# Patient Record
Sex: Male | Born: 1940 | Race: Asian | Hispanic: No | Marital: Married | Smoking: Former smoker
Health system: Southern US, Community
[De-identification: ages and names within clinical notes are randomized; demographics above are authoritative.]

## PROBLEM LIST (undated history)

## (undated) DIAGNOSIS — I10 Essential (primary) hypertension: Secondary | ICD-10-CM

## (undated) DIAGNOSIS — I639 Cerebral infarction, unspecified: Secondary | ICD-10-CM

## (undated) HISTORY — PX: APPENDECTOMY: SHX54

## (undated) HISTORY — DX: Essential (primary) hypertension: I10

## (undated) HISTORY — DX: Cerebral infarction, unspecified: I63.9

---

## 1995-11-26 HISTORY — PX: OTHER SURGICAL HISTORY: SHX169

## 2006-08-26 ENCOUNTER — Ambulatory Visit: Payer: Self-pay | Admitting: Family Medicine

## 2007-05-06 DIAGNOSIS — Z8679 Personal history of other diseases of the circulatory system: Secondary | ICD-10-CM | POA: Insufficient documentation

## 2007-05-06 DIAGNOSIS — I10 Essential (primary) hypertension: Secondary | ICD-10-CM | POA: Insufficient documentation

## 2007-08-13 ENCOUNTER — Encounter: Payer: Self-pay | Admitting: Family Medicine

## 2007-08-18 ENCOUNTER — Telehealth (INDEPENDENT_AMBULATORY_CARE_PROVIDER_SITE_OTHER): Payer: Self-pay | Admitting: *Deleted

## 2009-11-03 ENCOUNTER — Emergency Department (HOSPITAL_BASED_OUTPATIENT_CLINIC_OR_DEPARTMENT_OTHER): Admission: EM | Admit: 2009-11-03 | Discharge: 2009-11-03 | Payer: Self-pay | Admitting: Emergency Medicine

## 2009-11-03 ENCOUNTER — Ambulatory Visit: Payer: Self-pay | Admitting: Radiology

## 2011-02-26 LAB — CBC
Hemoglobin: 14.9 g/dL (ref 13.0–17.0)
RBC: 4.71 MIL/uL (ref 4.22–5.81)
RDW: 11.9 % (ref 11.5–15.5)

## 2011-02-26 LAB — DIFFERENTIAL
Basophils Absolute: 0.2 10*3/uL — ABNORMAL HIGH (ref 0.0–0.1)
Lymphocytes Relative: 7 % — ABNORMAL LOW (ref 12–46)
Monocytes Absolute: 1.7 10*3/uL — ABNORMAL HIGH (ref 0.1–1.0)
Monocytes Relative: 11 % (ref 3–12)
Neutro Abs: 12.7 10*3/uL — ABNORMAL HIGH (ref 1.7–7.7)
Neutrophils Relative %: 81 % — ABNORMAL HIGH (ref 43–77)

## 2011-02-26 LAB — BASIC METABOLIC PANEL
GFR calc non Af Amer: 50 mL/min — ABNORMAL LOW (ref >60)
Potassium: 2.8 mEq/L — ABNORMAL LOW (ref 3.5–5.1)
Sodium: 141 mEq/L (ref 135–145)

## 2012-08-21 DIAGNOSIS — R609 Edema, unspecified: Secondary | ICD-10-CM | POA: Diagnosis not present

## 2012-08-21 DIAGNOSIS — I6789 Other cerebrovascular disease: Secondary | ICD-10-CM | POA: Diagnosis not present

## 2012-08-21 DIAGNOSIS — H539 Unspecified visual disturbance: Secondary | ICD-10-CM | POA: Diagnosis not present

## 2012-08-21 DIAGNOSIS — R21 Rash and other nonspecific skin eruption: Secondary | ICD-10-CM | POA: Diagnosis not present

## 2012-08-21 DIAGNOSIS — E1159 Type 2 diabetes mellitus with other circulatory complications: Secondary | ICD-10-CM | POA: Diagnosis not present

## 2012-08-21 DIAGNOSIS — I69959 Hemiplegia and hemiparesis following unspecified cerebrovascular disease affecting unspecified side: Secondary | ICD-10-CM | POA: Diagnosis not present

## 2012-08-21 DIAGNOSIS — I10 Essential (primary) hypertension: Secondary | ICD-10-CM | POA: Diagnosis not present

## 2012-08-21 DIAGNOSIS — Z23 Encounter for immunization: Secondary | ICD-10-CM | POA: Diagnosis not present

## 2012-08-21 DIAGNOSIS — Z125 Encounter for screening for malignant neoplasm of prostate: Secondary | ICD-10-CM | POA: Diagnosis not present

## 2012-08-21 DIAGNOSIS — E119 Type 2 diabetes mellitus without complications: Secondary | ICD-10-CM | POA: Diagnosis not present

## 2012-08-31 DIAGNOSIS — I6789 Other cerebrovascular disease: Secondary | ICD-10-CM | POA: Diagnosis not present

## 2012-08-31 DIAGNOSIS — I739 Peripheral vascular disease, unspecified: Secondary | ICD-10-CM | POA: Diagnosis not present

## 2012-08-31 DIAGNOSIS — I10 Essential (primary) hypertension: Secondary | ICD-10-CM | POA: Diagnosis not present

## 2012-08-31 DIAGNOSIS — I119 Hypertensive heart disease without heart failure: Secondary | ICD-10-CM | POA: Diagnosis not present

## 2012-09-09 DIAGNOSIS — I69959 Hemiplegia and hemiparesis following unspecified cerebrovascular disease affecting unspecified side: Secondary | ICD-10-CM | POA: Diagnosis not present

## 2012-09-09 DIAGNOSIS — I10 Essential (primary) hypertension: Secondary | ICD-10-CM | POA: Diagnosis not present

## 2012-09-09 DIAGNOSIS — R21 Rash and other nonspecific skin eruption: Secondary | ICD-10-CM | POA: Diagnosis not present

## 2012-09-09 DIAGNOSIS — I6789 Other cerebrovascular disease: Secondary | ICD-10-CM | POA: Diagnosis not present

## 2012-10-26 DIAGNOSIS — Z79899 Other long term (current) drug therapy: Secondary | ICD-10-CM | POA: Diagnosis not present

## 2012-10-26 DIAGNOSIS — L259 Unspecified contact dermatitis, unspecified cause: Secondary | ICD-10-CM | POA: Diagnosis not present

## 2012-10-26 DIAGNOSIS — I10 Essential (primary) hypertension: Secondary | ICD-10-CM | POA: Diagnosis not present

## 2012-11-09 DIAGNOSIS — L259 Unspecified contact dermatitis, unspecified cause: Secondary | ICD-10-CM | POA: Diagnosis not present

## 2012-11-09 DIAGNOSIS — E876 Hypokalemia: Secondary | ICD-10-CM | POA: Diagnosis not present

## 2012-11-09 DIAGNOSIS — I10 Essential (primary) hypertension: Secondary | ICD-10-CM | POA: Diagnosis not present

## 2012-12-07 DIAGNOSIS — R072 Precordial pain: Secondary | ICD-10-CM | POA: Diagnosis not present

## 2012-12-07 DIAGNOSIS — Z0000 Encounter for general adult medical examination without abnormal findings: Secondary | ICD-10-CM | POA: Diagnosis not present

## 2012-12-07 DIAGNOSIS — I10 Essential (primary) hypertension: Secondary | ICD-10-CM | POA: Diagnosis not present

## 2012-12-07 DIAGNOSIS — E876 Hypokalemia: Secondary | ICD-10-CM | POA: Diagnosis not present

## 2013-01-05 DIAGNOSIS — E782 Mixed hyperlipidemia: Secondary | ICD-10-CM | POA: Diagnosis not present

## 2013-01-05 DIAGNOSIS — L259 Unspecified contact dermatitis, unspecified cause: Secondary | ICD-10-CM | POA: Diagnosis not present

## 2013-01-05 DIAGNOSIS — R5381 Other malaise: Secondary | ICD-10-CM | POA: Diagnosis not present

## 2013-01-05 DIAGNOSIS — I10 Essential (primary) hypertension: Secondary | ICD-10-CM | POA: Diagnosis not present

## 2013-01-05 DIAGNOSIS — R5383 Other fatigue: Secondary | ICD-10-CM | POA: Diagnosis not present

## 2013-02-19 DIAGNOSIS — I10 Essential (primary) hypertension: Secondary | ICD-10-CM | POA: Diagnosis not present

## 2013-02-19 DIAGNOSIS — I6990 Unspecified sequelae of unspecified cerebrovascular disease: Secondary | ICD-10-CM | POA: Diagnosis not present

## 2013-02-19 DIAGNOSIS — L299 Pruritus, unspecified: Secondary | ICD-10-CM | POA: Diagnosis not present

## 2013-04-13 DIAGNOSIS — L2089 Other atopic dermatitis: Secondary | ICD-10-CM | POA: Diagnosis not present

## 2013-04-13 DIAGNOSIS — E876 Hypokalemia: Secondary | ICD-10-CM | POA: Diagnosis not present

## 2013-06-10 DIAGNOSIS — L299 Pruritus, unspecified: Secondary | ICD-10-CM | POA: Diagnosis not present

## 2013-06-10 DIAGNOSIS — H1045 Other chronic allergic conjunctivitis: Secondary | ICD-10-CM | POA: Diagnosis not present

## 2013-06-10 DIAGNOSIS — J3089 Other allergic rhinitis: Secondary | ICD-10-CM | POA: Diagnosis not present

## 2013-07-23 DIAGNOSIS — I10 Essential (primary) hypertension: Secondary | ICD-10-CM | POA: Diagnosis not present

## 2013-07-23 DIAGNOSIS — Z125 Encounter for screening for malignant neoplasm of prostate: Secondary | ICD-10-CM | POA: Diagnosis not present

## 2013-07-23 DIAGNOSIS — Z23 Encounter for immunization: Secondary | ICD-10-CM | POA: Diagnosis not present

## 2013-07-23 DIAGNOSIS — Z79899 Other long term (current) drug therapy: Secondary | ICD-10-CM | POA: Diagnosis not present

## 2013-07-23 DIAGNOSIS — L2089 Other atopic dermatitis: Secondary | ICD-10-CM | POA: Diagnosis not present

## 2013-07-23 DIAGNOSIS — Z0000 Encounter for general adult medical examination without abnormal findings: Secondary | ICD-10-CM | POA: Diagnosis not present

## 2013-07-23 DIAGNOSIS — J309 Allergic rhinitis, unspecified: Secondary | ICD-10-CM | POA: Diagnosis not present

## 2013-07-23 DIAGNOSIS — N529 Male erectile dysfunction, unspecified: Secondary | ICD-10-CM | POA: Diagnosis not present

## 2013-07-23 DIAGNOSIS — I69959 Hemiplegia and hemiparesis following unspecified cerebrovascular disease affecting unspecified side: Secondary | ICD-10-CM | POA: Diagnosis not present

## 2013-07-23 DIAGNOSIS — I6990 Unspecified sequelae of unspecified cerebrovascular disease: Secondary | ICD-10-CM | POA: Diagnosis not present

## 2013-12-01 DIAGNOSIS — H5210 Myopia, unspecified eye: Secondary | ICD-10-CM | POA: Diagnosis not present

## 2013-12-01 DIAGNOSIS — H35039 Hypertensive retinopathy, unspecified eye: Secondary | ICD-10-CM | POA: Diagnosis not present

## 2014-03-17 DIAGNOSIS — Z79899 Other long term (current) drug therapy: Secondary | ICD-10-CM | POA: Diagnosis not present

## 2014-03-17 DIAGNOSIS — I6990 Unspecified sequelae of unspecified cerebrovascular disease: Secondary | ICD-10-CM | POA: Diagnosis not present

## 2014-03-17 DIAGNOSIS — I69959 Hemiplegia and hemiparesis following unspecified cerebrovascular disease affecting unspecified side: Secondary | ICD-10-CM | POA: Diagnosis not present

## 2014-03-17 DIAGNOSIS — I10 Essential (primary) hypertension: Secondary | ICD-10-CM | POA: Diagnosis not present

## 2014-06-07 DIAGNOSIS — H409 Unspecified glaucoma: Secondary | ICD-10-CM | POA: Diagnosis not present

## 2014-06-07 DIAGNOSIS — H2510 Age-related nuclear cataract, unspecified eye: Secondary | ICD-10-CM | POA: Diagnosis not present

## 2014-06-07 DIAGNOSIS — H02839 Dermatochalasis of unspecified eye, unspecified eyelid: Secondary | ICD-10-CM | POA: Diagnosis not present

## 2014-06-07 DIAGNOSIS — H18419 Arcus senilis, unspecified eye: Secondary | ICD-10-CM | POA: Diagnosis not present

## 2014-06-07 DIAGNOSIS — H402290 Chronic angle-closure glaucoma, unspecified eye, stage unspecified: Secondary | ICD-10-CM | POA: Diagnosis not present

## 2014-06-21 DIAGNOSIS — H409 Unspecified glaucoma: Secondary | ICD-10-CM | POA: Diagnosis not present

## 2014-06-21 DIAGNOSIS — H26499 Other secondary cataract, unspecified eye: Secondary | ICD-10-CM | POA: Diagnosis not present

## 2014-06-21 DIAGNOSIS — H40219 Acute angle-closure glaucoma, unspecified eye: Secondary | ICD-10-CM | POA: Diagnosis not present

## 2014-07-18 DIAGNOSIS — H269 Unspecified cataract: Secondary | ICD-10-CM | POA: Diagnosis not present

## 2014-07-18 DIAGNOSIS — H2510 Age-related nuclear cataract, unspecified eye: Secondary | ICD-10-CM | POA: Diagnosis not present

## 2014-07-18 HISTORY — PX: EYE SURGERY: SHX253

## 2014-07-19 DIAGNOSIS — H2510 Age-related nuclear cataract, unspecified eye: Secondary | ICD-10-CM | POA: Diagnosis not present

## 2014-07-29 DIAGNOSIS — L2089 Other atopic dermatitis: Secondary | ICD-10-CM | POA: Diagnosis not present

## 2014-07-29 DIAGNOSIS — I10 Essential (primary) hypertension: Secondary | ICD-10-CM | POA: Diagnosis not present

## 2014-07-29 DIAGNOSIS — J309 Allergic rhinitis, unspecified: Secondary | ICD-10-CM | POA: Diagnosis not present

## 2014-07-29 DIAGNOSIS — R079 Chest pain, unspecified: Secondary | ICD-10-CM | POA: Diagnosis not present

## 2014-07-29 DIAGNOSIS — I69959 Hemiplegia and hemiparesis following unspecified cerebrovascular disease affecting unspecified side: Secondary | ICD-10-CM | POA: Diagnosis not present

## 2014-07-29 DIAGNOSIS — I6990 Unspecified sequelae of unspecified cerebrovascular disease: Secondary | ICD-10-CM | POA: Diagnosis not present

## 2014-07-29 DIAGNOSIS — Z23 Encounter for immunization: Secondary | ICD-10-CM | POA: Diagnosis not present

## 2014-07-29 DIAGNOSIS — Z0000 Encounter for general adult medical examination without abnormal findings: Secondary | ICD-10-CM | POA: Diagnosis not present

## 2014-08-04 DIAGNOSIS — I6990 Unspecified sequelae of unspecified cerebrovascular disease: Secondary | ICD-10-CM | POA: Diagnosis not present

## 2014-08-04 DIAGNOSIS — I10 Essential (primary) hypertension: Secondary | ICD-10-CM | POA: Diagnosis not present

## 2014-08-04 DIAGNOSIS — Z79899 Other long term (current) drug therapy: Secondary | ICD-10-CM | POA: Diagnosis not present

## 2014-08-08 DIAGNOSIS — H2510 Age-related nuclear cataract, unspecified eye: Secondary | ICD-10-CM | POA: Diagnosis not present

## 2014-08-08 DIAGNOSIS — H269 Unspecified cataract: Secondary | ICD-10-CM | POA: Diagnosis not present

## 2014-09-12 ENCOUNTER — Encounter: Payer: Self-pay | Admitting: *Deleted

## 2014-09-14 ENCOUNTER — Ambulatory Visit (INDEPENDENT_AMBULATORY_CARE_PROVIDER_SITE_OTHER): Payer: Medicare Other | Admitting: Cardiology

## 2014-09-14 ENCOUNTER — Encounter: Payer: Self-pay | Admitting: Cardiology

## 2014-09-14 VITALS — BP 132/68 | HR 65 | Ht 64.0 in | Wt 144.0 lb

## 2014-09-14 DIAGNOSIS — R079 Chest pain, unspecified: Secondary | ICD-10-CM | POA: Diagnosis not present

## 2014-09-14 DIAGNOSIS — I10 Essential (primary) hypertension: Secondary | ICD-10-CM

## 2014-09-14 MED ORDER — OMEPRAZOLE 20 MG PO CPDR: 20.0000 mg | DELAYED_RELEASE_CAPSULE | Freq: Every day | ORAL | Status: AC

## 2014-09-14 NOTE — Patient Instructions (Addendum)
Your physician has recommended you make the following change in your medication:    START TAKING OMEPRAZOLE 20 MG DAILY   Your physician recommends that you return for lab work in: [place], ([place])- PLEASE BE FASTING FOR THIS LAB   Your physician has requested that you have a lexiscan myoview. For further information please visit [email or web address]. Please follow instruction sheet, as given.   Your physician recommends that you schedule a follow-up appointment in: [place]

## 2014-09-14 NOTE — Progress Notes (Signed)
Patient ID: Danny Lara, male   DOB: 01-16-1941, 73 y.o.   MRN: [id number]    Patient Name: Danny Lara Date of Encounter: 09/14/2014  Primary Care Provider:  No primary provider on file. Primary Cardiologist: Dorothy Spark  Problem List   Past Medical History  Diagnosis Date  . Hypertension, essential   . Stroke    Past Surgical History  Procedure Laterality Date  . Appendectomy    . Boil removal Right 1997     behind ear  . 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 The patient describes that his pain occurs about twice a month mostly when he lays down in bed and it is relived by sitting up. He sometimes gets it when standing for a prolonged time or bending dow. He is not very active as he has hemiparesis and walks with a cain, but denies chest pain on exertion. He admits to occasional LE edema, currently not present. No claudications, no palpitations, orthopnea or PND. No FH of premature CAD.   Home Medications  Prior to Admission medications   Medication Sig Start Date End Date Taking? Authorizing Provider  amLODipine (NORVASC) 10 MG tablet Take 10 mg by mouth daily.   Yes Historical Provider, MD  aspirin 81 MG tablet Take 81 mg by mouth daily.   Yes Historical Provider, MD  b complex vitamins capsule Take 1 capsule by mouth daily.   Yes Historical Provider, MD  clobetasol ointment (TEMOVATE) 2.99 % Apply 1 application topically 2 (two) times daily as needed.   Yes Historical Provider, MD  potassium chloride SA (K-DUR,KLOR-CON) 20 MEQ tablet Take 10 mEq by mouth once.   Yes Historical Provider, MD  spironolactone (ALDACTONE) 25 MG tablet Take 25 mg by mouth daily.   Yes Historical Provider, MD   Family History  Family History  Problem Relation Age of Onset  . Colon cancer Brother   . Diabetes Brother    . CVA Daughter 8  . Aneurysm Daughter 62  . Tuberculosis Son   . Hypertension Daughter    Social History  History   Social History  . Marital Status: Married    Spouse Name: N/A    Number of Children: N/A  . Years of Education: N/A   Occupational History  . Not on file.   Social History Main Topics  . Smoking status: Former Research scientist (life sciences)  . Smokeless tobacco: Not on file  . Alcohol Use: No  . Drug Use: No  . Sexual Activity: Not on file   Other Topics Concern  . Not on file   Social History Narrative  . No narrative on file    Review of Systems, as per HPI, otherwise negative General:  No chills, fever, night sweats or weight changes.  Cardiovascular:  No chest pain, dyspnea on exertion, edema, orthopnea, palpitations, paroxysmal nocturnal dyspnea. Dermatological: No rash, lesions/masses Respiratory: No cough, dyspnea Urologic: No hematuria, dysuria Abdominal:   No nausea, vomiting, diarrhea, bright red blood per rectum, melena, or hematemesis Neurologic:  No visual changes, wkns, changes in mental status. All other systems reviewed and are otherwise negative except as noted above.  Physical Exam  Blood pressure 132/68, pulse 65, height '5\' 4"'  (1.626 m), weight 144 lb (65.318 kg), SpO2 96.00%.  General: Pleasant, NAD, left hemiparesis Psych: Normal affect.  Neuro: Alert and oriented X 3. Moves all extremities spontaneously. HEENT: Normal  Neck: Supple without bruits or JVD. Lungs:  Resp regular and unlabored, CTA. Heart: RRR no s3, s4, or murmurs. Abdomen: Soft, non-tender, non-distended, BS + x 4.  Extremities: No clubbing, cyanosis, Right calf larger than left, but no swelling, left calf muscle atrophy. DP/PT/Radials 2+ and equal bilaterally.  Labs:  No results found for this basename: CKTOTAL, CKMB, TROPONINI,  in the last 72 hours Lab Results  Component Value Date   WBC 15.7* 11/03/2009   HGB 14.9 11/03/2009   HCT 43.3 11/03/2009   MCV 91.9 11/03/2009    PLT 174 11/03/2009    No results found for this basename: DDIMER   No components found with this basename: POCBNP,     Component Value Date/Time   NA 141 11/03/2009 1937   K 2.8* 11/03/2009 1937   CL 95* 11/03/2009 1937   CO2 32 11/03/2009 1937   GLUCOSE 163* 11/03/2009 1937   BUN 16 11/03/2009 1937   CREATININE 1.4 11/03/2009 1937   CALCIUM 8.9 11/03/2009 1937   GFRNONAA 50* 11/03/2009 1937   GFRAA  Value: >60        The eGFR has been calculated using the MDRD equation. This calculation has not been validated in all clinical situations. eGFR's persistently <60 mL/min signify possible Chronic Kidney Disease. 11/03/2009 1937   No results found for this basename: CHOL, HDL, LDLCALC, TRIG   Accessory Clinical Findings  Echocardiogram - none  ECG - SR, negative T wave in the V1-4 and aVL    Assessment & Plan  73 year old male   1. Chest pain - atypical and most probably secondary to GERD. We will start omeprazole 20 mg po daily. However, his ECG is abnormal and suggest possible anterolateral ischemia  We will therefore order a Lexiscan nuclear stress test to rule out ischemia.  2. Hypertension - well controlled on current regimen  3. H/O CVA - on ASA  4. Lipids - none in our records, we will check  There are no labs, hypokalemia in 2010, we will recheck.   Dorothy Spark, MD, [facility] 09/14/2014, 10:14 AM

## 2014-09-21 ENCOUNTER — Ambulatory Visit (HOSPITAL_COMMUNITY): Payer: Medicare Other | Attending: Internal Medicine | Admitting: Radiology

## 2014-09-21 ENCOUNTER — Other Ambulatory Visit (INDEPENDENT_AMBULATORY_CARE_PROVIDER_SITE_OTHER): Payer: Medicare Other | Admitting: *Deleted

## 2014-09-21 VITALS — BP 131/68 | HR 58 | Ht 64.0 in | Wt 142.0 lb

## 2014-09-21 DIAGNOSIS — R079 Chest pain, unspecified: Secondary | ICD-10-CM

## 2014-09-21 DIAGNOSIS — R9431 Abnormal electrocardiogram [ECG] [EKG]: Secondary | ICD-10-CM | POA: Insufficient documentation

## 2014-09-21 DIAGNOSIS — I998 Other disorder of circulatory system: Secondary | ICD-10-CM | POA: Diagnosis present

## 2014-09-21 DIAGNOSIS — I10 Essential (primary) hypertension: Secondary | ICD-10-CM

## 2014-09-21 DIAGNOSIS — Z8673 Personal history of transient ischemic attack (TIA), and cerebral infarction without residual deficits: Secondary | ICD-10-CM | POA: Insufficient documentation

## 2014-09-21 DIAGNOSIS — R0789 Other chest pain: Secondary | ICD-10-CM | POA: Diagnosis present

## 2014-09-21 LAB — COMPREHENSIVE METABOLIC PANEL
ALT: 27 U/L (ref 0–53)
AST: 30 U/L (ref 0–37)
Albumin: 3.7 g/dL (ref 3.5–5.2)
Alkaline Phosphatase: 56 U/L (ref 39–117)
BUN: 9 mg/dL (ref 6–23)
CO2: 22 mEq/L (ref 19–32)
Calcium: 9 mg/dL (ref 8.4–10.5)
Chloride: 106 mEq/L (ref 96–112)
Creatinine, Ser: 1 mg/dL (ref 0.4–1.5)
GFR: 75.75 mL/min (ref >60.00)
Glucose, Bld: 95 mg/dL (ref 70–99)
Potassium: 4.3 mEq/L (ref 3.5–5.1)
Sodium: 137 mEq/L (ref 135–145)
Total Bilirubin: 1 mg/dL (ref 0.2–1.2)
Total Protein: 7.3 g/dL (ref 6.0–8.3)

## 2014-09-21 LAB — LIPID PANEL
Cholesterol: 152 mg/dL (ref 0–200)
HDL: 39.4 mg/dL (ref >39.00)
LDL Cholesterol: 92 mg/dL (ref 0–99)
NonHDL: 112.6
Total CHOL/HDL Ratio: 4
Triglycerides: 101 mg/dL (ref 0.0–149.0)
VLDL: 20.2 mg/dL (ref 0.0–40.0)

## 2014-09-21 MED ORDER — REGADENOSON 0.4 MG/5ML IV SOLN
0.4000 mg | Freq: Once | INTRAVENOUS | Status: AC
Start: 2014-09-21 — End: 2014-09-21
  Administered 2014-09-21: 0.4 mg via INTRAVENOUS

## 2014-09-21 MED ORDER — TECHNETIUM TC 99M SESTAMIBI GENERIC - CARDIOLITE
33.0000 | Freq: Once | INTRAVENOUS | Status: AC | PRN
  Administered 2014-09-21: 33 via INTRAVENOUS

## 2014-09-21 MED ORDER — TECHNETIUM TC 99M SESTAMIBI GENERIC - CARDIOLITE
11.0000 | Freq: Once | INTRAVENOUS | Status: AC | PRN
  Administered 2014-09-21: 11 via INTRAVENOUS

## 2014-09-21 NOTE — Progress Notes (Signed)
[facility] [address] [place], [place] [address] [phone]    Cardiology Nuclear Med Study  Danny Lara is a 73 y.o. male     MRN : [id number]     DOB: 1941-03-11  Procedure Date: 09/21/2014  Nuclear Med Background Indication for Stress Test:  Evaluation for Ischemia and Abnormal EKG History:  MPI 2014 (normal) in [place] Cardiac Risk Factors: CVA and Hypertension  Symptoms:  Chest Pain (last date of chest discomfort was one month ago) and SOB   Nuclear Pre-Procedure Caffeine/Decaff Intake:  None NPO After: 7:00pm   Lungs:  clear O2 Sat: 98% on room air. IV 0.9% NS with Angio Cath:  22g  IV Site: R Hand  IV Started by:  Crissie Figures, RN  Chest Size (in):  42 Cup Size: n/a  Height: 5\' 4"  (1.626 m)  Weight:  142 lb (64.411 kg)  BMI:  Body mass index is 24.36 kg/(m^2). Tech Comments:  N/A    Nuclear Med Study 1 or 2 day study: 1 day  Stress Test Type:  Lexiscan  Reading MD: N/A  Order Authorizing Provider:  Ena Dawley, MD  Resting Radionuclide: Technetium 42m Sestamibi  Resting Radionuclide Dose: 11.0 mCi   Stress Radionuclide:  Technetium 69m Sestamibi  Stress Radionuclide Dose: 33.0 mCi           Stress Protocol Rest HR: 58 Stress HR: 92  Rest BP: 131/68 Stress BP: 125/59  Exercise Time (min): n/a METS: n/a   Predicted Max HR: 146 bpm % Max HR: 63.01 bpm Rate Pressure Product: 12512   Dose of Adenosine (mg):  n/a Dose of Lexiscan: 0.4 mg  Dose of Atropine (mg): n/a Dose of Dobutamine: n/a mcg/kg/min (at max HR)  Stress Test Technologist: Glade Lloyd, BS-ES  Nuclear Technologist:  Earl Many, CNMT     Rest Procedure:  Myocardial perfusion imaging was performed at rest 45 minutes following the intravenous administration of Technetium 42m Sestamibi. Rest ECG: NSR - Normal EKG  Stress Procedure:  The patient received IV Lexiscan 0.4 mg over 15-seconds.  Technetium 31m Sestamibi injected at 30-seconds.   Quantitative spect images were obtained after a 45 minute delay.  During the infusion of Lexiscan the patient complained of SOB that subsided in recovery.  Stress ECG: No significant change from baseline ECG  QPS Raw Data Images:  Normal; no motion artifact; normal heart/lung ratio. Stress Images:  Normal homogeneous uptake in all areas of the myocardium. Rest Images:  Normal homogeneous uptake in all areas of the myocardium. Subtraction (SDS):  No evidence of ischemia. Transient Ischemic Dilatation (Normal <1.22):  1.04 Lung/Heart Ratio (Normal <0.45):  0.36  Quantitative Gated Spect Images QGS EDV:  90 ml QGS ESV:  41 ml  Impression Exercise Capacity:  Lexiscan with no exercise. BP Response:  Normal blood pressure response. Clinical Symptoms:  No chest pain. ECG Impression:  No significant ST segment change suggestive of ischemia. Comparison with Prior Nuclear Study: No images to compare  Overall Impression:  Normal stress nuclear study.  LV Ejection Fraction: 54%.  LV Wall Motion:  NL LV Function; NL Wall Motion  Darlin Coco MD

## 2014-10-13 ENCOUNTER — Ambulatory Visit: Payer: Self-pay | Admitting: Cardiology

## 2014-11-10 ENCOUNTER — Ambulatory Visit: Payer: Self-pay | Admitting: Cardiology

## 2014-11-30 ENCOUNTER — Ambulatory Visit (INDEPENDENT_AMBULATORY_CARE_PROVIDER_SITE_OTHER): Payer: Medicare Other | Admitting: Cardiology

## 2014-11-30 ENCOUNTER — Encounter: Payer: Self-pay | Admitting: Cardiology

## 2014-11-30 VITALS — BP 124/62 | HR 70 | Ht 64.0 in | Wt 136.4 lb

## 2014-11-30 DIAGNOSIS — R072 Precordial pain: Secondary | ICD-10-CM

## 2014-11-30 DIAGNOSIS — K219 Gastro-esophageal reflux disease without esophagitis: Secondary | ICD-10-CM

## 2014-11-30 DIAGNOSIS — I10 Essential (primary) hypertension: Secondary | ICD-10-CM

## 2014-11-30 NOTE — Patient Instructions (Signed)
Your physician recommends that you continue on your current medications as directed. Please refer to the Current Medication list given to you today.   Your physician wants you to follow-up in: ONE YEAR WITH DR NELSON You will receive a reminder letter in the mail two months in advance. If you don't receive a letter, please call our office to schedule the follow-up appointment.  

## 2014-11-30 NOTE — Progress Notes (Signed)
Patient ID: Danny Lara, male   DOB: [birth year], 75 y.o.   MRN: [id number] Patient ID: Danny Lara, male   DOB: [birth year], 74 y.o.   MRN: [id number]    Patient Name: Danny Lara Date of Encounter: 11/30/2014  Primary Care Provider:  No primary care provider on file. Primary Cardiologist: Dorothy Spark  Problem List   Past Medical History  Diagnosis Date  . Hypertension, essential   . Stroke    Past Surgical History  Procedure Laterality Date  . Appendectomy    . Boil removal Right 1997     behind ear  . 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 The patient describes that his pain occurs about twice a month mostly when he lays down in bed and it is relived by sitting up. He sometimes gets it when standing for a prolonged time or bending dow. He is not very active as he has hemiparesis and walks with a cain, but denies chest pain on exertion. He admits to occasional LE edema, currently not present. No claudications, no palpitations, orthopnea or PND. No FH of premature CAD.   11/30/2013 - the patient is coming after 6 weeks. He stated that his chest pain has resolved. He was started on pantoprazole at the last visit and it was felt that his chest pain was atypical and related to reflux disease. He underwent stress testing and was normal. He has no other complaints.  Home Medications  Prior to Admission medications   Medication Sig Start Date End Date Taking? Authorizing Provider  amLODipine (NORVASC) 10 MG tablet Take 10 mg by mouth daily.   Yes Historical Provider, MD  aspirin 81 MG tablet Take 81 mg by mouth daily.   Yes Historical Provider, MD  b complex vitamins capsule Take 1 capsule by mouth daily.   Yes Historical Provider, MD  clobetasol ointment (TEMOVATE) 0.53 % Apply 1 application topically 2 (two)  times daily as needed.   Yes Historical Provider, MD  potassium chloride SA (K-DUR,KLOR-CON) 20 MEQ tablet Take 10 mEq by mouth once.   Yes Historical Provider, MD  spironolactone (ALDACTONE) 25 MG tablet Take 25 mg by mouth daily.   Yes Historical Provider, MD   Family History  Family History  Problem Relation Age of Onset  . Colon cancer Brother   . Diabetes Brother   . CVA Daughter 94  . Aneurysm Daughter 77  . Tuberculosis Son   . Hypertension Daughter    Social History  History   Social History  . Marital Status: Married    Spouse Name: N/A    Number of Children: N/A  . Years of Education: N/A   Occupational History  . Not on file.   Social History Main Topics  . Smoking status: Former Research scientist (life sciences)  . Smokeless tobacco: Not on file  . Alcohol Use: No  . Drug Use: No  . Sexual Activity: Not on file   Other Topics Concern  . Not on file   Social History Narrative    Review of Systems, as per HPI, otherwise negative General:  No chills, fever, night sweats or weight changes.  Cardiovascular:  No chest pain, dyspnea on exertion, edema, orthopnea, palpitations, paroxysmal nocturnal dyspnea. Dermatological: No rash, lesions/masses Respiratory: No cough, dyspnea Urologic: No hematuria, dysuria Abdominal:   No  nausea, vomiting, diarrhea, bright red blood per rectum, melena, or hematemesis Neurologic:  No visual changes, wkns, changes in mental status. All other systems reviewed and are otherwise negative except as noted above.  Physical Exam  Blood pressure 124/62, pulse 70, height 5' 4" (1.626 m), weight 136 lb 6.4 oz (61.871 kg), SpO2 97 %.  General: Pleasant, NAD, left hemiparesis Psych: Normal affect. Neuro: Alert and oriented X 3. Moves all extremities spontaneously. HEENT: Normal  Neck: Supple without bruits or JVD. Lungs:  Resp regular and unlabored, CTA. Heart: RRR no s3, s4, or murmurs. Abdomen: Soft, non-tender, non-distended, BS + x 4.  Extremities: No  clubbing, cyanosis, Right calf larger than left, but no swelling, left calf muscle atrophy. DP/PT/Radials 2+ and equal bilaterally.  Labs:  No results for input(s): CKTOTAL, CKMB, TROPONINI in the last 72 hours. Lab Results  Component Value Date   WBC 15.7* 11/03/2009   HGB 14.9 11/03/2009   HCT 43.3 11/03/2009   MCV 91.9 11/03/2009   PLT 174 11/03/2009    No results found for: DDIMER Invalid input(s): POCBNP    Component Value Date/Time   NA 137 09/21/2014 0851   K 4.3 09/21/2014 0851   CL 106 09/21/2014 0851   CO2 22 09/21/2014 0851   GLUCOSE 95 09/21/2014 0851   BUN 9 09/21/2014 0851   CREATININE 1.0 09/21/2014 0851   CALCIUM 9.0 09/21/2014 0851   PROT 7.3 09/21/2014 0851   ALBUMIN 3.7 09/21/2014 0851   AST 30 09/21/2014 0851   ALT 27 09/21/2014 0851   ALKPHOS 56 09/21/2014 0851   BILITOT 1.0 09/21/2014 0851   GFRNONAA 50* 11/03/2009 1937   GFRAA  11/03/2009 1937    >60        The eGFR has been calculated using the MDRD equation. This calculation has not been validated in all clinical situations. eGFR's persistently <60 mL/min signify possible Chronic Kidney Disease.   Lab Results  Component Value Date   CHOL 152 09/21/2014   Accessory Clinical Findings  Echocardiogram - none  ECG - SR, negative T wave in the V1-4 and aVL  Lexiscan nuclear stress test: 09/21/2014 Quantitative Gated Spect Images QGS EDV: 90 ml QGS ESV: 41 ml  Impression Exercise Capacity: Lexiscan with no exercise. BP Response: Normal blood pressure response. Clinical Symptoms: No chest pain. ECG Impression: No significant ST segment change suggestive of ischemia. Comparison with Prior Nuclear Study: No images to compare  Overall Impression: Normal stress nuclear study.  LV Ejection Fraction: 54%. LV Wall Motion: NL LV Function; NL Wall Motion  Darlin Coco MD    Assessment & Plan  74 year old male   1. Chest pain - atypical and most probably secondary to  GERD. Resolved with starting of PPIs.  However, his ECG is abnormal and suggest possible anterolateral ischemia, Alexis can nuclear stress test showed no prior infarct, no ischemia and preserved left ventricular function.  2. Hypertension - well controlled on current regimen  3. H/O CVA - on ASA  4. Lipids - all at goal no therapy needed.  5. GERD - continue pantoprazole.  Follow up in 1 year.  Dorothy Spark, MD, [facility] 11/30/2014, 10:14 AM

## 2015-03-16 DIAGNOSIS — J309 Allergic rhinitis, unspecified: Secondary | ICD-10-CM | POA: Diagnosis not present

## 2015-03-16 DIAGNOSIS — I10 Essential (primary) hypertension: Secondary | ICD-10-CM | POA: Diagnosis not present

## 2015-03-16 DIAGNOSIS — E785 Hyperlipidemia, unspecified: Secondary | ICD-10-CM | POA: Diagnosis not present

## 2015-03-16 DIAGNOSIS — L309 Dermatitis, unspecified: Secondary | ICD-10-CM | POA: Diagnosis not present

## 2015-03-16 DIAGNOSIS — M25511 Pain in right shoulder: Secondary | ICD-10-CM | POA: Diagnosis not present

## 2015-03-16 DIAGNOSIS — I69359 Hemiplegia and hemiparesis following cerebral infarction affecting unspecified side: Secondary | ICD-10-CM | POA: Diagnosis not present

## 2015-03-16 DIAGNOSIS — D692 Other nonthrombocytopenic purpura: Secondary | ICD-10-CM | POA: Diagnosis not present

## 2015-05-30 ENCOUNTER — Other Ambulatory Visit: Payer: Self-pay | Admitting: Cardiology

## 2015-08-25 DIAGNOSIS — Z23 Encounter for immunization: Secondary | ICD-10-CM | POA: Diagnosis not present

## 2015-08-25 DIAGNOSIS — D692 Other nonthrombocytopenic purpura: Secondary | ICD-10-CM | POA: Diagnosis not present

## 2015-08-25 DIAGNOSIS — L309 Dermatitis, unspecified: Secondary | ICD-10-CM | POA: Diagnosis not present

## 2015-08-25 DIAGNOSIS — M1990 Unspecified osteoarthritis, unspecified site: Secondary | ICD-10-CM | POA: Diagnosis not present

## 2015-08-25 DIAGNOSIS — Z0000 Encounter for general adult medical examination without abnormal findings: Secondary | ICD-10-CM | POA: Diagnosis not present

## 2015-08-25 DIAGNOSIS — I10 Essential (primary) hypertension: Secondary | ICD-10-CM | POA: Diagnosis not present

## 2015-08-25 DIAGNOSIS — I69359 Hemiplegia and hemiparesis following cerebral infarction affecting unspecified side: Secondary | ICD-10-CM | POA: Diagnosis not present

## 2015-08-25 DIAGNOSIS — I6980 Unspecified sequelae of other cerebrovascular disease: Secondary | ICD-10-CM | POA: Diagnosis not present

## 2015-08-25 DIAGNOSIS — Z79899 Other long term (current) drug therapy: Secondary | ICD-10-CM | POA: Diagnosis not present

## 2015-09-05 ENCOUNTER — Other Ambulatory Visit: Payer: Self-pay | Admitting: Cardiology

## 2015-09-05 NOTE — Telephone Encounter (Signed)
There are no records on file that Dr Meda Coffee has seen this pt at all.  Unsure of how this medication was filled under Dr Francesca Oman name on 06/01/15.  Please confirm that this is the correct pts chart and information in which request is coming from. Please do not fill this medication, unless Dr Meda Coffee has seen or followed this pt in the past.

## 2015-09-15 DIAGNOSIS — Z1211 Encounter for screening for malignant neoplasm of colon: Secondary | ICD-10-CM | POA: Diagnosis not present

## 2015-09-19 ENCOUNTER — Other Ambulatory Visit: Payer: Self-pay | Admitting: Cardiology

## 2015-09-20 ENCOUNTER — Other Ambulatory Visit: Payer: Self-pay | Admitting: Cardiology

## 2015-11-30 ENCOUNTER — Ambulatory Visit: Payer: Medicare Other | Admitting: Cardiology

## 2015-11-30 DIAGNOSIS — E559 Vitamin D deficiency, unspecified: Secondary | ICD-10-CM | POA: Diagnosis not present

## 2015-11-30 DIAGNOSIS — E785 Hyperlipidemia, unspecified: Secondary | ICD-10-CM | POA: Diagnosis not present

## 2016-06-27 DIAGNOSIS — I69354 Hemiplegia and hemiparesis following cerebral infarction affecting left non-dominant side: Secondary | ICD-10-CM | POA: Diagnosis not present

## 2016-06-27 DIAGNOSIS — I10 Essential (primary) hypertension: Secondary | ICD-10-CM | POA: Diagnosis not present

## 2016-06-27 DIAGNOSIS — D1801 Hemangioma of skin and subcutaneous tissue: Secondary | ICD-10-CM | POA: Diagnosis not present

## 2016-06-27 DIAGNOSIS — D489 Neoplasm of uncertain behavior, unspecified: Secondary | ICD-10-CM | POA: Diagnosis not present

## 2016-07-10 DIAGNOSIS — I10 Essential (primary) hypertension: Secondary | ICD-10-CM | POA: Diagnosis not present

## 2016-07-10 DIAGNOSIS — D1801 Hemangioma of skin and subcutaneous tissue: Secondary | ICD-10-CM | POA: Diagnosis not present

## 2016-07-10 DIAGNOSIS — L209 Atopic dermatitis, unspecified: Secondary | ICD-10-CM | POA: Diagnosis not present

## 2016-07-10 DIAGNOSIS — D1800 Hemangioma unspecified site: Secondary | ICD-10-CM | POA: Diagnosis not present

## 2016-07-10 DIAGNOSIS — Z8673 Personal history of transient ischemic attack (TIA), and cerebral infarction without residual deficits: Secondary | ICD-10-CM | POA: Diagnosis not present

## 2016-08-29 DIAGNOSIS — I69359 Hemiplegia and hemiparesis following cerebral infarction affecting unspecified side: Secondary | ICD-10-CM | POA: Diagnosis not present

## 2016-08-29 DIAGNOSIS — Z79899 Other long term (current) drug therapy: Secondary | ICD-10-CM | POA: Diagnosis not present

## 2016-08-29 DIAGNOSIS — J309 Allergic rhinitis, unspecified: Secondary | ICD-10-CM | POA: Diagnosis not present

## 2016-08-29 DIAGNOSIS — D692 Other nonthrombocytopenic purpura: Secondary | ICD-10-CM | POA: Diagnosis not present

## 2016-08-29 DIAGNOSIS — E559 Vitamin D deficiency, unspecified: Secondary | ICD-10-CM | POA: Diagnosis not present

## 2016-08-29 DIAGNOSIS — L309 Dermatitis, unspecified: Secondary | ICD-10-CM | POA: Diagnosis not present

## 2016-08-29 DIAGNOSIS — Z0000 Encounter for general adult medical examination without abnormal findings: Secondary | ICD-10-CM | POA: Diagnosis not present

## 2016-08-29 DIAGNOSIS — M1990 Unspecified osteoarthritis, unspecified site: Secondary | ICD-10-CM | POA: Diagnosis not present

## 2016-08-29 DIAGNOSIS — E785 Hyperlipidemia, unspecified: Secondary | ICD-10-CM | POA: Diagnosis not present

## 2016-08-29 DIAGNOSIS — I10 Essential (primary) hypertension: Secondary | ICD-10-CM | POA: Diagnosis not present

## 2016-08-29 DIAGNOSIS — M25511 Pain in right shoulder: Secondary | ICD-10-CM | POA: Diagnosis not present

## 2016-08-29 DIAGNOSIS — Z23 Encounter for immunization: Secondary | ICD-10-CM | POA: Diagnosis not present

## 2016-09-11 ENCOUNTER — Ambulatory Visit: Payer: Medicare Other | Attending: Family Medicine | Admitting: Physical Therapy

## 2016-09-11 DIAGNOSIS — R2689 Other abnormalities of gait and mobility: Secondary | ICD-10-CM | POA: Insufficient documentation

## 2016-09-11 DIAGNOSIS — I69354 Hemiplegia and hemiparesis following cerebral infarction affecting left non-dominant side: Secondary | ICD-10-CM | POA: Insufficient documentation

## 2016-09-11 DIAGNOSIS — R262 Difficulty in walking, not elsewhere classified: Secondary | ICD-10-CM | POA: Diagnosis not present

## 2016-09-11 NOTE — Therapy (Addendum)
[facility] [address]  [place], [place], [address] Phone: [phone]   Fax:  [phone]  Physical Therapy Evaluation  Patient Details  Name: Danny Lara MRN: [id number] Date of Birth: 1941-01-13 Referring Provider: Jonathon Jordan, MD  Encounter Date: 09/11/2016      PT End of Session - 09/11/16 1150    Visit Number 1   Number of Visits 16   Date for PT Re-Evaluation 11/08/16   Authorization Type Medicare/Medicaid   PT Start Time 1100   PT Stop Time 1158   PT Time Calculation (min) 58 min   Activity Tolerance Patient tolerated treatment well   Behavior During Therapy [facility] for tasks assessed/performed      Past Medical History:  Diagnosis Date  . Hypertension, essential   . Stroke     Past Surgical History:  Procedure Laterality Date  . APPENDECTOMY    . boil removal Right 1997    behind ear  . EYE SURGERY Right 07-18-14    There were no vitals filed for this visit.       Subjective Assessment - 09/11/16 1110    Subjective Pt reports distant h/o of CVA in May 1999, after which he underwent PT for 1-2 months. Comes to PT today due to realizing that his insurance will cover PT. Currently using SBQC for household ambulation, but only very limited community ambulation. Pt reports he has an AFO for his L LE but does use it as it hurts his gastroc.   Patient is accompained by: Family member  son-in-law   How long can you walk comfortably? around house; typically uses wheelchair on outings   Patient Stated Goals "To be 50% better."   Currently in Pain? No/denies   Pain Score 0-No pain  up to 7/10, typically after overstretching the leg   Pain Location Leg   Pain Orientation Left   Aggravating Factors  increases initially upon sit to stand and taking first few steps   Pain Relieving Factors rest   Effect of Pain on Daily Activities does not typically limit activity            [facility] PT  Assessment - 09/11/16 1100      Assessment   Medical Diagnosis CVA - L hemiparesis   Referring Provider Jonathon Jordan, MD   Onset Date/Surgical Date 03/29/98   Hand Dominance Right   Next MD Visit 2018   Prior Therapy initially after CVA, none recently     Balance Screen   Has the patient fallen in the past 6 months No   Has the patient had a decrease in activity level because of a fear of falling?  Yes   Is the patient reluctant to leave their home because of a fear of falling?  No     Home Environment   Living Environment Private residence   Living Arrangements Children   Type of [place] to enter   Entrance Stairs-Number of Steps 1   [place] One level   [place] - quad;Shower seat;Grab bars - toilet;Grab bars - tub/shower;Wheelchair - manual     Prior Function   Level of Independence Independent with basic ADLs;Independent with household mobility with device   Vocation Retired   Leisure read, watch TV - mostly sedentary     Observation/Other Assessments   Focus on Therapeutic Outcomes (FOTO)  Neuromuscular disorder - 45% (55% limitation); predicted 54% (46% limitation)  Sensation   Light Touch Appears Intact   Hot/Cold Appears Intact     ROM / Strength   AROM / PROM / Strength Strength     Strength   Strength Assessment Site Hip;Knee;Ankle   Right/Left Hip Right;Left   Right Hip Flexion 4+/5   Right Hip Extension 4+/5   Right Hip ABduction 5/5   Right Hip ADduction 5/5   Left Hip Flexion 4-/5   Left Hip Extension 4-/5   Left Hip ABduction 4/5   Left Hip ADduction 4-/5   Right/Left Knee Right;Left   Right Knee Flexion 5/5   Right Knee Extension 5/5   Left Knee Flexion 2/5   Left Knee Extension 4-/5   Right/Left Ankle Right;Left   Right Ankle Dorsiflexion 4/5   Right Ankle Plantar Flexion 4/5   Right Ankle Inversion 4/5   Right Ankle Eversion 4/5   Left Ankle Dorsiflexion 2/5   Left Ankle Plantar Flexion 3/5    Left Ankle Inversion 0/5   Left Ankle Eversion 0/5     Standardized Balance Assessment   Standardized Balance Assessment Berg Balance Test     Berg Balance Test   Sit to Stand Able to stand  independently using hands   Standing Unsupported Able to stand 30 seconds unsupported   Sitting with Back Unsupported but Feet Supported on Floor or Stool Able to sit safely and securely 2 minutes   Stand to Sit Uses backs of legs against chair to control descent   Transfers Able to transfer with verbal cueing and /or supervision   Standing Unsupported with Eyes Closed Able to stand 3 seconds   Standing Ubsupported with Feet Together Needs help to attain position and unable to hold for 15 seconds   From Standing, Reach Forward with Outstretched Arm Reaches forward but needs supervision   From Standing Position, Pick up Object from Floor Unable to pick up shoe, but reaches 2-5 cm (1-2") from shoe and balances independently   From Standing Position, Turn to Look Behind Over each Shoulder Needs assist to keep from losing balance and falling   Turn 360 Degrees Needs assistance while turning   Standing Unsupported, Alternately Place Feet on Step/Stool Needs assistance to keep from falling or unable to try   Standing Unsupported, One Foot in [employer] balance while stepping or standing   Standing on One Leg Tries to lift leg/unable to hold 3 seconds but remains standing independently   Total Score 19                   PT Short Term Goals - 09/11/16 1154      PT SHORT TERM GOAL #1   Title Independent with initial HEP including appropriate LE as indicated by 10/04/16   Status New           PT Long Term Goals - 09/11/16 1154      PT LONG TERM GOAL #1   Title Independent with advanced HEP as indicated by 11/08/16   Status New     PT LONG TERM GOAL #2   Title Increase Berg to >/= 30/56 to improve stability with standing activities and decrease risk for falls by 11/08/16   Status New      PT LONG TERM GOAL #3   Title Estabilish gait goals as appropriate based on gait assessment findings    Status New               Plan - 09/11/16 1150  Clinical Impression Statement Danny Lara is 75 y/o male who presents to OP for L non-dominant side hemiparesis secondary to CVA in May 1999.  Pt currently uses a SBQC for household ambulation, but reports very limited community ambulation. Reports he has an AFO for his L foot but does use it because it causes pain in his gastroc muscle. Pt denies any pain at time of eval but does report pain in L LE when he does too much with his stretching at home, therefore will plan to review and modify home stretching program as indicated. Assessment reveals L LE weakness, distal > proximal, with decreased coordination and mild tone influence. Balance testing via the Berg (19/56) reveals high risk for falls. Unable to complete full gait assessment due to lack of AFO and pt requiring extended bathroom break during PT visit, therefore will plan to complete gait assessment at next visit and finalize POC and goals at that time.   Rehab Potential Good   Clinical Impairments Affecting Rehab Potential Arthritis, HTN, h/o R shoulder AC joint pain   PT Frequency 2x / week   PT Duration 8 weeks   PT Treatment/Interventions Patient/family education;Neuromuscular re-education;Balance training;Therapeutic exercise;Therapeutic activities;Functional mobility training;Gait training;ADLs/Self Care Home Management   PT Next Visit Plan Complete gait assessment including gait speed & TUG as appropriate and establish goals as indicated; Review pt's personal home stretching program and modify as indicated   Consulted and Agree with Plan of Care Patient;Family member/caregiver   Family Member Consulted son-in-law      Patient will benefit from skilled therapeutic intervention in order to improve the following deficits and impairments:  Decreased strength, Decreased range  of motion, Decreased balance, Decreased coordination, Impaired tone, Difficulty walking, Abnormal gait, Decreased activity tolerance  Visit Diagnosis: Hemiplegia and hemiparesis following cerebral infarction affecting left non-dominant side ([place]) - Plan: PT plan of care cert/re-cert  Other abnormalities of gait and mobility - Plan: PT plan of care cert/re-cert  Difficulty in walking, not elsewhere classified - Plan: PT plan of care cert/re-cert      G-Codes - [id number]    Functional Assessment Tool Used Merrilee Jansky = 19/56 (66.1% limitation) + Clinical judgement   Functional Limitation Mobility: Walking and moving around   Mobility: Walking and Moving Around Current Status [id number]) At least 60 percent but less than 80 percent impaired, limited or restricted   Mobility: Walking and Moving Around Goal Status [phone]) At least 40 percent but less than 60 percent impaired, limited or restricted       Problem List Patient Active Problem List   Diagnosis Date Noted  . Chest pain 09/14/2014  . HYPERTENSION 05/06/2007  . CEREBROVASCULAR ACCIDENT, HX OF 05/06/2007    [name], PT, [name] 09/11/2016, 1:02 PM  [facility] [address]  [address] [place], [place], [address] Phone: [phone]   Fax:  [phone]  Name: Danny Lara MRN: [id number] Date of Birth: [birth year]

## 2016-09-12 ENCOUNTER — Ambulatory Visit: Payer: Medicare Other

## 2016-09-17 ENCOUNTER — Ambulatory Visit: Payer: Medicare Other | Admitting: Physical Therapy

## 2016-09-17 DIAGNOSIS — R2689 Other abnormalities of gait and mobility: Secondary | ICD-10-CM | POA: Diagnosis not present

## 2016-09-17 DIAGNOSIS — R262 Difficulty in walking, not elsewhere classified: Secondary | ICD-10-CM

## 2016-09-17 DIAGNOSIS — I69354 Hemiplegia and hemiparesis following cerebral infarction affecting left non-dominant side: Secondary | ICD-10-CM | POA: Diagnosis not present

## 2016-09-17 NOTE — Therapy (Signed)
[facility] [address]  [place], [place], [address] Phone: [phone]   Fax:  [phone]  Physical Therapy Treatment  Patient Details  Name: Danny Lara MRN: [id number] Date of Birth: 12-03-1940 Referring Provider: Jonathon Jordan, MD  Encounter Date: 09/17/2016      PT End of Session - 09/17/16 1100    Visit Number 2   Number of Visits 16   Date for PT Re-Evaluation 11/08/16   Authorization Type Medicare/Medicaid   PT Start Time 1100   PT Stop Time 1147   PT Time Calculation (min) 47 min   Activity Tolerance Patient tolerated treatment well   Behavior During Therapy [facility] for tasks assessed/performed      Past Medical History:  Diagnosis Date  . Hypertension, essential   . Stroke     Past Surgical History:  Procedure Laterality Date  . APPENDECTOMY    . boil removal Right 1997    behind ear  . EYE SURGERY Right 07-18-14    There were no vitals filed for this visit.      Subjective Assessment - 09/17/16 1100    Subjective Pt wore AFO to therapy today but c/o pain in posterior calf. Upper edge of AFO pressing into posterior calf.   Currently in Pain? Yes   Pain Score 6    Pain Location Leg  calf (where AFO comes around at gastroc muscle)   Pain Orientation Left   Pain Descriptors / Indicators Pressure   Pain Type Acute pain   Pain Frequency Intermittent            [facility] PT Assessment - 09/17/16 1100      Ambulation/Gait   Assistive device Small based quad cane   Gait Pattern Step-to pattern;Decreased arm swing - left;Decreased step length - right;Decreased step length - left;Decreased stance time - left;Decreased stride length;Decreased hip/knee flexion - left;Decreased dorsiflexion - left;Decreased weight shift to left;Left circumduction;Left genu recurvatum;Lateral trunk lean to right;Poor foot clearance - left   Ambulation Surface Level;Indoor   Gait velocity 0.74 ft/sec   44.62" w/ AFO & SBQC   Gait Comments Pt demonstrating tendency to place [facility] too far forward causing pt to ambulate with ~1/4 turn to L     Standardized Balance Assessment   Standardized Balance Assessment Timed Up and Go Test     Timed Up and Go Test   TUG Normal TUG   Normal TUG (seconds) 54.5                [facility] Adult PT Treatment/Exercise - 09/17/16 1100      Self-Care   Self-Care Other Self-Care Comments   Other Self-Care Comments  Pt provided demo of home "stretches" (exercises) with rapid movement patterns and "bouncing" with most motions. Provided cues to slow pace and isolate movement patterns and will plan for formal instruction in HEP at next visit.                  PT Short Term Goals - 09/17/16 1145      PT SHORT TERM GOAL #1   Title Independent with initial HEP as indicated by 10/04/16   Status On-going     PT SHORT TERM GOAL #2   Title Ambulate with [facility] demonstrating appropriate sequencing & step pattern with cues <50% of time by 10/11/16   Status New           PT Long Term Goals - 09/17/16 1145  PT LONG TERM GOAL #1   Title Independent with advanced HEP as indicated by 11/08/16   Status On-going     PT LONG TERM GOAL #2   Title Increase Berg to >/= 30/56 to improve stability with standing activities and decrease risk for falls by 11/08/16   Status On-going     PT LONG TERM GOAL #3   Title Ambulate with [facility] demonstrating appropriate sequencing & step pattern with cues <10% of time by 11/08/16   Status New     PT LONG TERM GOAL #4   Title Gait speed >/= 1.3 ft/sec to allow for limited community ambulation by 11/08/16   Status New     PT LONG TERM GOAL #5   Title --               Plan - 09/17/16 1207    Clinical Impression Statement Pt very impulsive with gait wanting to place SBQC at extended distance in front causing pt to essentially walk with diagonal gait pattern. Also pt wanting to place [facility] on rear 2 legs  and rock over base while stepping. Provided initial cues to correct gait pattern and improve safety. Pt wore L AFO to therapy session today but AFO lacking calf strap and cutting into posterior calf at lower gastrocs causing pain. PT reports AFO ~36-71 years old (obtained shortly after to moving to [place] from [place] in 2000), therefore will look into referral for new AFO (orders faxed to MD) and obtain orthotics consult.   Rehab Potential Good   Clinical Impairments Affecting Rehab Potential Arthritis, HTN, h/o R shoulder AC joint pain   PT Treatment/Interventions Patient/family education;Neuromuscular re-education;Balance training;Therapeutic exercise;Therapeutic activities;Functional mobility training;Gait training;ADLs/Self Care Home Management   PT Next Visit Plan Review pt's personal home stretching/exercise program and modify as indicated; F/u on L AFO orthotics consult   Consulted and Agree with Plan of Care Patient;Family member/caregiver   Family Member Consulted son-in-law      Patient will benefit from skilled therapeutic intervention in order to improve the following deficits and impairments:  Decreased strength, Decreased range of motion, Decreased balance, Decreased coordination, Impaired tone, Difficulty walking, Abnormal gait, Decreased activity tolerance  Visit Diagnosis: Hemiplegia and hemiparesis following cerebral infarction affecting left non-dominant side ([facility])  Other abnormalities of gait and mobility  Difficulty in walking, not elsewhere classified     Problem List Patient Active Problem List   Diagnosis Date Noted  . Chest pain 09/14/2014  . HYPERTENSION 05/06/2007  . CEREBROVASCULAR ACCIDENT, HX OF 05/06/2007    [name], PT, [name] 09/17/2016, 12:20 PM  [facility] [address]  [address] [place], [place], [address] Phone: [phone]   Fax:  [phone]  Name: Danny Lara MRN:  [id number] Date of Birth: [birth year]

## 2016-09-19 ENCOUNTER — Ambulatory Visit: Payer: Medicare Other | Admitting: Physical Therapy

## 2016-09-19 DIAGNOSIS — R262 Difficulty in walking, not elsewhere classified: Secondary | ICD-10-CM | POA: Diagnosis not present

## 2016-09-19 DIAGNOSIS — I69354 Hemiplegia and hemiparesis following cerebral infarction affecting left non-dominant side: Secondary | ICD-10-CM | POA: Diagnosis not present

## 2016-09-19 DIAGNOSIS — R2689 Other abnormalities of gait and mobility: Secondary | ICD-10-CM | POA: Diagnosis not present

## 2016-09-19 NOTE — Therapy (Addendum)
[facility] [address]  [place], [place], [address] Phone: [phone]   Fax:  [phone]  Physical Therapy Treatment  Patient Details  Name: Danny Lara MRN: [id number] Date of Birth: 05-29-1941 Referring Provider: Jonathon Jordan, MD  Encounter Date: 09/19/2016      PT End of Session - 09/19/16 1445    Visit Number 3   Number of Visits 16   Date for PT Re-Evaluation 11/08/16   Authorization Type Medicare/Medicaid   PT Start Time 1445   PT Stop Time 1529   PT Time Calculation (min) 44 min   Activity Tolerance Patient tolerated treatment well   Behavior During Therapy [facility] for tasks assessed/performed      Past Medical History:  Diagnosis Date  . Hypertension, essential   . Stroke     Past Surgical History:  Procedure Laterality Date  . APPENDECTOMY    . boil removal Right 1997    behind ear  . EYE SURGERY Right 07-18-14    There were no vitals filed for this visit.      Subjective Assessment - 09/19/16 1445    Subjective No new c/o today.   Patient Stated Goals "To be 50% better."   Currently in Pain? No/denies                 [facility] Adult PT Treatment/Exercise - 09/19/16 1445     Ambulation/Gait   Ambulation Distance (Feet) 180 Feet   Assistive device Small based quad cane   Gait Pattern Step-to pattern;Decreased arm swing - left;Decreased step length - right;Decreased step length - left;Decreased stance time - left;Decreased stride length;Decreased hip/knee flexion - left;Decreased dorsiflexion - left;Decreased weight shift to left;Left circumduction;Left genu recurvatum;Lateral trunk lean to right;Poor foot clearance - left   Ambulation Surface Level;Unlevel   Gait velocity --   Gait Comments Trial of L ToeOFF AFO with pt able to demontrate improved foot clearance but continued excessive genu recurvatum. L heel lift added in attempt to reduce recurvatum, but minimal  appreciable change noted.     Knee/Hip Exercises: Standing   Functional Squat 10 reps;3 seconds   Functional Squat Limitations R UE support on back of chair; cues to avoid  knees past toes & squatting too low      Knee/Hip Exercises: Seated   Marching Both;10 reps   Marching Limitations green TB     Knee/Hip Exercises: Supine   Bridges Both;10 reps   Straight Leg Raises Left;10 reps   Other Supine Knee/Hip Exercises Alt Hip ABD/ER with green TB x10                PT Education - 09/19/16 1529    Education provided Yes   Education Details Initial HEP   Person(s) Educated Patient   Methods Explanation;Demonstration;Handout   Comprehension Verbalized understanding;Returned demonstration;Need further instruction          PT Short Term Goals - 09/17/16 1145      PT SHORT TERM GOAL #1   Title Independent with initial HEP as indicated by 10/04/16   Status On-going     PT SHORT TERM GOAL #2   Title Ambulate with [facility] demonstrating appropriate sequencing & step pattern with cues <50% of time by 10/11/16   Status New           PT Long Term Goals - 09/17/16 1145      PT LONG TERM GOAL #1   Title Independent with advanced HEP  as indicated by 11/08/16   Status On-going     PT LONG TERM GOAL #2   Title Increase Berg to >/= 30/56 to improve stability with standing activities and decrease risk for falls by 11/08/16   Status On-going     PT LONG TERM GOAL #3   Title Ambulate with [facility] demonstrating appropriate sequencing & step pattern with cues <10% of time by 11/08/16   Status New     PT LONG TERM GOAL #4   Title Gait speed >/= 1.3 ft/sec to allow for limited community ambulation by 11/08/16   Status New     PT LONG TERM GOAL #5   Title --               Plan - 09/19/16 1530    Clinical Impression Statement Pt demonstrating good recall and carryover of gait training from last visit with improved cane placement and forward positioning. Initiated trial  of gait training with ToeOFF L AFO with pt able to demonstrate improved foot clearance but pt continued to demonstrate excessive genu recurvatum. Tried adding heel lift to reduce genu recurvatum but no appreciable improvement noted. Awating MD to proceed with formal orthotics consult. Reviewed pt's self-created home "stretches" and provide instruction in proper technique for performing exercises at home, with HEP handout provided.   Rehab Potential Good   Clinical Impairments Affecting Rehab Potential Arthritis, HTN, h/o R shoulder AC joint pain   PT Treatment/Interventions Patient/family education;Neuromuscular re-education;Balance training;Therapeutic exercise;Therapeutic activities;Functional mobility training;Gait training;ADLs/Self Care Home Management   PT Next Visit Plan Review initial HEP; F/u on L AFO orthotics consult; [place] gait training; L LE strengthening; Balance training   Consulted and Agree with Plan of Care Patient;Family member/caregiver   Family Member Consulted son-in-law      Patient will benefit from skilled therapeutic intervention in order to improve the following deficits and impairments:  Decreased strength, Decreased range of motion, Decreased balance, Decreased coordination, Impaired tone, Difficulty walking, Abnormal gait, Decreased activity tolerance  Visit Diagnosis: Hemiplegia and hemiparesis following cerebral infarction affecting left non-dominant side (HCC)  Other abnormalities of gait and mobility  Difficulty in walking, not elsewhere classified     Problem List Patient Active Problem List   Diagnosis Date Noted  . Chest pain 09/14/2014  . HYPERTENSION 05/06/2007  . CEREBROVASCULAR ACCIDENT, HX OF 05/06/2007    [name], PT, [name] 09/20/2016, 9:37 AM  [facility] [place], [place], [address] Phone: [phone]   Fax:  [phone]  Name: Danny Lara MRN: [id number] Date of Birth: May 26, 1941

## 2016-09-24 ENCOUNTER — Ambulatory Visit: Payer: Medicare Other | Admitting: Physical Therapy

## 2016-09-24 DIAGNOSIS — R2689 Other abnormalities of gait and mobility: Secondary | ICD-10-CM

## 2016-09-24 DIAGNOSIS — R262 Difficulty in walking, not elsewhere classified: Secondary | ICD-10-CM

## 2016-09-24 DIAGNOSIS — I69354 Hemiplegia and hemiparesis following cerebral infarction affecting left non-dominant side: Secondary | ICD-10-CM

## 2016-09-24 NOTE — Therapy (Signed)
[facility] [address]  [place], [place], [address] Phone: [phone]   Fax:  [phone]  Physical Therapy Treatment  Patient Details  Name: Danny Lara MRN: [id number] Date of Birth: 1941-06-27 Referring Provider: Jonathon Jordan, MD  Encounter Date: 09/24/2016      PT End of Session - 09/24/16 1100    Visit Number 4   Number of Visits 16   Date for PT Re-Evaluation 11/08/16   Authorization Type Medicare/Medicaid   PT Start Time 1100   PT Stop Time 1148   PT Time Calculation (min) 48 min   Activity Tolerance Patient tolerated treatment well   Behavior During Therapy [facility] for tasks assessed/performed      Past Medical History:  Diagnosis Date  . Hypertension, essential   . Stroke     Past Surgical History:  Procedure Laterality Date  . APPENDECTOMY    . boil removal Right 1997    behind ear  . EYE SURGERY Right 07-18-14    There were no vitals filed for this visit.      Subjective Assessment - 09/24/16 1100    Subjective Pt reporting L "kneecap" pain with walking.   Patient Stated Goals "To be 50% better."   Currently in Pain? Yes   Pain Score --  6-7/10   Pain Location Knee   Pain Orientation Left                 [facility] Adult PT Treatment/Exercise - 09/24/16 1100      Knee/Hip Exercises: Aerobic   Nustep lvl 4 x 6'     Knee/Hip Exercises: Standing   Functional Squat 15 reps;3 seconds   Functional Squat Limitations R UE support on back of chair; cues for slow pace & to avoid knees past toes & squatting too low      Knee/Hip Exercises: Seated   Other Seated Knee/Hip Exercises Fitter Leg Press - L (2 blue)/R (1 black/1 blue) x15   Marching Both;15 reps   Marching Limitations green TB     Knee/Hip Exercises: Supine   Bridges Both;15 reps   Bridges Limitations 5" hold   Straight Leg Raises Left;15 reps   Other Supine Knee/Hip Exercises Alt Hip ABD/ER with green TB x15      Knee/Hip Exercises: Sidelying   Clams B clam (no resistance) x10                  PT Short Term Goals - 09/24/16 1109      PT SHORT TERM GOAL #1   Title Independent with initial HEP as indicated by 10/04/16   Status On-going     PT SHORT TERM GOAL #2   Title Ambulate with SBQC demonstrating appropriate sequencing & step pattern with cues <50% of time by 10/11/16   Status On-going           PT Long Term Goals - 09/24/16 1109      PT LONG TERM GOAL #1   Title Independent with advanced HEP as indicated by 11/08/16   Status On-going     PT LONG TERM GOAL #2   Title Increase Berg to >/= 30/56 to improve stability with standing activities and decrease risk for falls by 11/08/16   Status On-going     PT LONG TERM GOAL #3   Title Ambulate with [facility] demonstrating appropriate sequencing & step pattern with cues <10% of time by 11/08/16   Status On-going  PT LONG TERM GOAL #4   Title Gait speed >/= 1.3 ft/sec to allow for limited community ambulation by 11/08/16   Status On-going               Plan - 09/24/16 1109    Clinical Impression Statement Pt able to demonstrate good recall of HEP but required frequent cues to slow pace to better isolate desire muscle activity and avoid use of momentum. Able to coordinate with orthotist for AFO fitting during therapy session next Wed 11/8.    Rehab Potential Good   Clinical Impairments Affecting Rehab Potential Arthritis, HTN, h/o R shoulder AC joint pain   PT Treatment/Interventions Patient/family education;Neuromuscular re-education;Balance training;Therapeutic exercise;Therapeutic activities;Functional mobility training;Gait training;ADLs/Self Care Home Management   PT Next Visit Plan L AFO orthotics consult Wed 11/8 during therapy session; Continue gait training; L LE strengthening; Balance training   Consulted and Agree with Plan of Care Patient;Family member/caregiver   Family Member Consulted son-in-law       Patient will benefit from skilled therapeutic intervention in order to improve the following deficits and impairments:  Decreased strength, Decreased range of motion, Decreased balance, Decreased coordination, Impaired tone, Difficulty walking, Abnormal gait, Decreased activity tolerance  Visit Diagnosis: Hemiplegia and hemiparesis following cerebral infarction affecting left non-dominant side (HCC)  Other abnormalities of gait and mobility  Difficulty in walking, not elsewhere classified     Problem List Patient Active Problem List   Diagnosis Date Noted  . Chest pain 09/14/2014  . HYPERTENSION 05/06/2007  . CEREBROVASCULAR ACCIDENT, HX OF 05/06/2007    [name], PT, [name] 09/24/2016, 12:09 PM  [facility] [address]  [place], [place], [address] Phone: [phone]   Fax:  [phone]  Name: Danny Lara MRN: [id number] Date of Birth: 1941/08/28

## 2016-09-26 ENCOUNTER — Ambulatory Visit: Payer: Medicare Other | Attending: Family Medicine | Admitting: Physical Therapy

## 2016-09-26 DIAGNOSIS — R2689 Other abnormalities of gait and mobility: Secondary | ICD-10-CM

## 2016-09-26 DIAGNOSIS — R262 Difficulty in walking, not elsewhere classified: Secondary | ICD-10-CM | POA: Diagnosis not present

## 2016-09-26 DIAGNOSIS — I69354 Hemiplegia and hemiparesis following cerebral infarction affecting left non-dominant side: Secondary | ICD-10-CM | POA: Diagnosis not present

## 2016-09-26 NOTE — Therapy (Signed)
[facility] [address]  [place], [place], [address] Phone: [phone]   Fax:  [phone]  Physical Therapy Treatment  Patient Details  Name: Danny Lara MRN: [id number] Date of Birth: Jun 13, 1941 Referring Provider: Jonathon Jordan, MD  Encounter Date: 09/26/2016      PT End of Session - 09/26/16 1107    Visit Number 5   Number of Visits 16   Date for PT Re-Evaluation 11/08/16   Authorization Type Medicare/Medicaid   PT Start Time 1107   PT Stop Time 1150   PT Time Calculation (min) 43 min   Activity Tolerance Patient tolerated treatment well   Behavior During Therapy [facility] for tasks assessed/performed      Past Medical History:  Diagnosis Date  . Hypertension, essential   . Stroke     Past Surgical History:  Procedure Laterality Date  . APPENDECTOMY    . boil removal Right 1997    behind ear  . EYE SURGERY Right 07-18-14    There were no vitals filed for this visit.      Subjective Assessment - 09/26/16 1107    Subjective No new c/o or concerns today, with no furhter pain reported.   Patient Stated Goals "To be 50% better."   Currently in Pain? No/denies                  [facility] Adult PT Treatment/Exercise - 09/26/16 1107      Ambulation/Gait   Ambulation Distance (Feet) 100 Feet   Assistive device Small based quad cane   Gait Pattern Step-to pattern;Decreased arm swing - left;Decreased step length - right;Decreased step length - left;Decreased stance time - left;Decreased stride length;Decreased hip/knee flexion - left;Decreased dorsiflexion - left;Decreased weight shift to left;Left circumduction;Left genu recurvatum;Lateral trunk lean to right;Poor foot clearance - left   Ambulation Surface Level;Indoor   Gait Comments Cues for posture and body orientation to neutral fwd alignment     Knee/Hip Exercises: Aerobic   Nustep lvl 4 x 6'     Knee/Hip Exercises: Standing   Hip  Flexion Both;10 reps;Knee bent   Hip Flexion Limitations R UE support on counter; cues for upright posture   Hip Extension Both;10 reps;Knee straight   Extension Limitations R UE support on counter; cues for upright posture     Knee/Hip Exercises: Seated   Other Seated Knee/Hip Exercises Fitter Leg Press - L (2 blue)/R (1 black/1 blue) x15   Marching Left;15 reps   Marching Limitations emphasis on hip ext with red TB   Hamstring Curl Right;Left;15 reps   Hamstring Limitations R - green TB, L - yellow TB   Abduction/Adduction  Left;15 reps   Abd/Adduction Limitations red TB for both   Sit to Sand 5 reps;without UE support  R foot on blue foam oval to facilitate increased L LE effort                  PT Short Term Goals - 09/24/16 1109      PT SHORT TERM GOAL #1   Title Independent with initial HEP as indicated by 10/04/16   Status On-going     PT SHORT TERM GOAL #2   Title Ambulate with [facility] demonstrating appropriate sequencing & step pattern with cues <50% of time by 10/11/16   Status On-going           PT Long Term Goals - 09/24/16 1109      PT LONG TERM  GOAL #1   Title Independent with advanced HEP as indicated by 11/08/16   Status On-going     PT LONG TERM GOAL #2   Title Increase Berg to >/= 30/56 to improve stability with standing activities and decrease risk for falls by 11/08/16   Status On-going     PT LONG TERM GOAL #3   Title Ambulate with [facility] demonstrating appropriate sequencing & step pattern with cues <10% of time by 11/08/16   Status On-going     PT LONG TERM GOAL #4   Title Gait speed >/= 1.3 ft/sec to allow for limited community ambulation by 11/08/16   Status On-going               Plan - 09/26/16 1150    Clinical Impression Statement Continued focus on LE strengthening with frequent verbal and tactile cues to minimize substitution and for controlled pace. Orthotist will join PT and pt at next treatment session to evaluate for  new L AFO.   Rehab Potential Good   Clinical Impairments Affecting Rehab Potential Arthritis, HTN, h/o R shoulder AC joint pain   PT Treatment/Interventions Patient/family education;Neuromuscular re-education;Balance training;Therapeutic exercise;Therapeutic activities;Functional mobility training;Gait training;ADLs/Self Care Home Management   PT Next Visit Plan L AFO orthotics consult Wed 11/8 during therapy session; Continue gait training; L LE strengthening; Balance training   Consulted and Agree with Plan of Care Patient;Family member/caregiver   Family Member Consulted son-in-law      Patient will benefit from skilled therapeutic intervention in order to improve the following deficits and impairments:  Decreased strength, Decreased range of motion, Decreased balance, Decreased coordination, Impaired tone, Difficulty walking, Abnormal gait, Decreased activity tolerance  Visit Diagnosis: Hemiplegia and hemiparesis following cerebral infarction affecting left non-dominant side (HCC)  Other abnormalities of gait and mobility  Difficulty in walking, not elsewhere classified     Problem List Patient Active Problem List   Diagnosis Date Noted  . Chest pain 09/14/2014  . HYPERTENSION 05/06/2007  . CEREBROVASCULAR ACCIDENT, HX OF 05/06/2007    [name], PT, [name] 09/26/2016, 12:05 PM  [facility] [address]  [place], [place], [address] Phone: [phone]   Fax:  [phone]  Name: Danny Lara MRN: [id number] Date of Birth: [birth year]

## 2016-10-01 ENCOUNTER — Ambulatory Visit: Payer: Medicare Other | Admitting: Physical Therapy

## 2016-10-02 ENCOUNTER — Ambulatory Visit: Payer: Medicare Other | Admitting: Physical Therapy

## 2016-10-02 DIAGNOSIS — R262 Difficulty in walking, not elsewhere classified: Secondary | ICD-10-CM | POA: Diagnosis not present

## 2016-10-02 DIAGNOSIS — R2689 Other abnormalities of gait and mobility: Secondary | ICD-10-CM

## 2016-10-02 DIAGNOSIS — I69354 Hemiplegia and hemiparesis following cerebral infarction affecting left non-dominant side: Secondary | ICD-10-CM

## 2016-10-02 NOTE — Therapy (Signed)
[facility] [address]  [place], [place], [address] Phone: [phone]   Fax:  [phone]  Physical Therapy Treatment  Patient Details  Name: Danny Lara MRN: [id number] Date of Birth: 08/28/1941 Referring Provider: Jonathon Jordan, MD  Encounter Date: 10/02/2016      PT End of Session - 10/02/16 1101    Visit Number 6   Number of Visits 16   Date for PT Re-Evaluation 11/08/16   Authorization Type Medicare/Medicaid   PT Start Time 1101   PT Stop Time 1152   PT Time Calculation (min) 51 min   Activity Tolerance Patient tolerated treatment well   Behavior During Therapy [facility] for tasks assessed/performed      Past Medical History:  Diagnosis Date  . Hypertension, essential   . Stroke     Past Surgical History:  Procedure Laterality Date  . APPENDECTOMY    . boil removal Right 1997    behind ear  . EYE SURGERY Right 07-18-14    There were no vitals filed for this visit.      Subjective Assessment - 10/02/16 1106    Subjective Pt reports L knee has been bothering him more - had to take Tylenol + codiene last night and used Biofeeze this morning.   Patient Stated Goals "To be 50% better."   Currently in Pain? Yes   Pain Score 4    Pain Location Knee   Pain Orientation Left                   [facility] Adult PT Treatment/Exercise - 10/02/16 1101      Ambulation/Gait   Assistive device Small based quad cane   Gait Pattern Step-to pattern;Decreased arm swing - left;Decreased step length - right;Decreased step length - left;Decreased stance time - left;Decreased stride length;Decreased hip/knee flexion - left;Decreased dorsiflexion - left;Decreased weight shift to left;Left circumduction;Left genu recurvatum;Lateral trunk lean to right;Poor foot clearance - left   Ambulation Surface Level;Indoor   Gait Comments Gait pattern w/o AFO demonstrated for orthotist     Knee/Hip Exercises:  Aerobic   Nustep lvl 5 x 6'     Knee/Hip Exercises: Seated   Other Seated Knee/Hip Exercises Fitter Leg Press - L (2 blue)/R (1 black/1 blue) x15   Marching Left;15 reps   Marching Limitations emphasis on hip ext with red TB   Hamstring Curl Right;Left;15 reps   Hamstring Limitations R - green TB, L - yellow TB                  PT Short Term Goals - 09/24/16 1109      PT SHORT TERM GOAL #1   Title Independent with initial HEP as indicated by 10/04/16   Status On-going     PT SHORT TERM GOAL #2   Title Ambulate with [facility] demonstrating appropriate sequencing & step pattern with cues <50% of time by 10/11/16   Status On-going           PT Long Term Goals - 09/24/16 1109      PT LONG TERM GOAL #1   Title Independent with advanced HEP as indicated by 11/08/16   Status On-going     PT LONG TERM GOAL #2   Title Increase Berg to >/= 30/56 to improve stability with standing activities and decrease risk for falls by 11/08/16   Status On-going     PT LONG TERM GOAL #3   Title Ambulate with  [facility] demonstrating appropriate sequencing & step pattern with cues <10% of time by 11/08/16   Status On-going     PT LONG TERM GOAL #4   Title Gait speed >/= 1.3 ft/sec to allow for limited community ambulation by 11/08/16   Status On-going               Plan - 10/02/16 1109    Clinical Impression Statement PT treatment visit coordinated with orthotist for L AFO consultation with objective of AFO to minimize L genu recurvatum and promote improved foot clearance and heel strike for normalized gait pattern. Per orthotist, AFO should be completed w/in ~2 weeks. Exercises performed while awaiting orthotist arrival and during orthotist's questioning.   Rehab Potential Good   Clinical Impairments Affecting Rehab Potential Arthritis, HTN, h/o R shoulder AC joint pain   PT Treatment/Interventions Patient/family education;Neuromuscular re-education;Balance training;Therapeutic  exercise;Therapeutic activities;Functional mobility training;Gait training;ADLs/Self Care Home Management   PT Next Visit Plan L LE strengthening; Balance training; Continue gait training once orthotic fabricated   Consulted and Agree with Plan of Care Patient;Family member/caregiver   Family Member Consulted son-in-law      Patient will benefit from skilled therapeutic intervention in order to improve the following deficits and impairments:  Decreased strength, Decreased range of motion, Decreased balance, Decreased coordination, Impaired tone, Difficulty walking, Abnormal gait, Decreased activity tolerance  Visit Diagnosis: Hemiplegia and hemiparesis following cerebral infarction affecting left non-dominant side (HCC)  Other abnormalities of gait and mobility  Difficulty in walking, not elsewhere classified     Problem List Patient Active Problem List   Diagnosis Date Noted  . Chest pain 09/14/2014  . HYPERTENSION 05/06/2007  . CEREBROVASCULAR ACCIDENT, HX OF 05/06/2007    [name], PT, [name] 10/02/2016, 12:11 PM  [facility] [address]  [place], [place], [address] Phone: [phone]   Fax:  [phone]  Name: Danny Lara MRN: [id number] Date of Birth: Nov 06, 1941

## 2016-10-03 ENCOUNTER — Ambulatory Visit: Payer: Medicare Other | Admitting: Physical Therapy

## 2016-10-03 DIAGNOSIS — I69354 Hemiplegia and hemiparesis following cerebral infarction affecting left non-dominant side: Secondary | ICD-10-CM | POA: Diagnosis not present

## 2016-10-03 DIAGNOSIS — R2689 Other abnormalities of gait and mobility: Secondary | ICD-10-CM

## 2016-10-03 DIAGNOSIS — R262 Difficulty in walking, not elsewhere classified: Secondary | ICD-10-CM | POA: Diagnosis not present

## 2016-10-03 NOTE — Therapy (Signed)
[facility] [address]  [place], [place], [address] Phone: [phone]   Fax:  [phone]  Physical Therapy Treatment  Patient Details  Name: Danny Lara MRN: [id number] Date of Birth: 25-Jun-1941 Referring Provider: Jonathon Jordan, MD  Encounter Date: 10/03/2016      PT End of Session - 10/03/16 1103    Visit Number 7   Number of Visits 16   Date for PT Re-Evaluation 11/08/16   Authorization Type Medicare/Medicaid   PT Start Time 1103   PT Stop Time 1145   PT Time Calculation (min) 42 min   Activity Tolerance Patient tolerated treatment well   Behavior During Therapy [facility] for tasks assessed/performed      Past Medical History:  Diagnosis Date  . Hypertension, essential   . Stroke     Past Surgical History:  Procedure Laterality Date  . APPENDECTOMY    . boil removal Right 1997    behind ear  . EYE SURGERY Right 07-18-14    There were no vitals filed for this visit.      Subjective Assessment - 10/03/16 1110    Subjective Pt reports knee pain has resolved today.   Patient Stated Goals "To be 50% better."   Currently in Pain? No/denies                  [facility] Adult PT Treatment/Exercise - 10/03/16 1103      Ambulation/Gait   Ambulation Distance (Feet) 50 Feet   Assistive device Small based quad cane   Gait Pattern Step-to pattern;Decreased arm swing - left;Decreased step length - right;Decreased step length - left;Decreased stance time - left;Decreased stride length;Decreased hip/knee flexion - left;Decreased dorsiflexion - left;Decreased weight shift to left;Left circumduction;Left genu recurvatum;Lateral trunk lean to right;Poor foot clearance - left;Trunk rotated posteriorly on left   Ambulation Surface Level;Indoor   Gait Comments Cues for posture and body orientation to neutral fwd alignment     Knee/Hip Exercises: Standing   Hip Flexion Both;15 reps;Knee bent   Hip  Flexion Limitations marching with NBQC support     Knee/Hip Exercises: Seated   Long Arc Quad Right;Left;15 reps   Marching Both;15 reps   Marching Limitations green TB   Abduction/Adduction  Right;Left;15 reps   Abd/Adduction Limitations green TB             Balance Exercises - 10/03/16 1103      Balance Exercises: Standing   Standing Eyes Opened Solid surface;2 reps;Wide (BOA);Narrow base of support (BOS);Time  2' wide BOS; 60" narrow BOS   Standing Eyes Closed Solid surface;Wide (BOA);1 rep;Time  30"   Tandem Stance --   Partial Tandem Stance Eyes open;Intermittent upper extremity support;Time  60" semi-tandem with L foot fwd   Other Standing Exercises Trunk rotation x5 (SBA), Lat weight shift x10 (CGA/min assist); Dynamic reaching beyond BOS in all directions             PT Short Term Goals - 09/24/16 1109      PT SHORT TERM GOAL #1   Title Independent with initial HEP as indicated by 10/04/16   Status On-going     PT SHORT TERM GOAL #2   Title Ambulate with [facility] demonstrating appropriate sequencing & step pattern with cues <50% of time by 10/11/16   Status On-going           PT Long Term Goals - 09/24/16 1109      PT LONG TERM GOAL #  1   Title Independent with advanced HEP as indicated by 11/08/16   Status On-going     PT LONG TERM GOAL #2   Title Increase Berg to >/= 30/56 to improve stability with standing activities and decrease risk for falls by 11/08/16   Status On-going     PT LONG TERM GOAL #3   Title Ambulate with [facility] demonstrating appropriate sequencing & step pattern with cues <10% of time by 11/08/16   Status On-going     PT LONG TERM GOAL #4   Title Gait speed >/= 1.3 ft/sec to allow for limited community ambulation by 11/08/16   Status On-going               Plan - 10/03/16 1145    Clinical Impression Statement Targeted static and dynamic standing balance in corner with pt able to maintain static positions for extended  time periods but demonstrating difficulty with weight shift during dynamic activities. Interspersed seated LE exercises during seated rest breaks. Pt continues to require cues for posture and body alignment during gait along with placement of NBQC.   Rehab Potential Good   Clinical Impairments Affecting Rehab Potential Arthritis, HTN, h/o R shoulder AC joint pain   PT Treatment/Interventions Patient/family education;Neuromuscular re-education;Balance training;Therapeutic exercise;Therapeutic activities;Functional mobility training;Gait training;ADLs/Self Care Home Management   PT Next Visit Plan L LE strengthening; Balance training; Continue gait training once orthotic fabricated   Consulted and Agree with Plan of Care Patient;Family member/caregiver   Family Member Consulted son-in-law      Patient will benefit from skilled therapeutic intervention in order to improve the following deficits and impairments:  Decreased strength, Decreased range of motion, Decreased balance, Decreased coordination, Impaired tone, Difficulty walking, Abnormal gait, Decreased activity tolerance  Visit Diagnosis: Hemiplegia and hemiparesis following cerebral infarction affecting left non-dominant side (HCC)  Other abnormalities of gait and mobility  Difficulty in walking, not elsewhere classified     Problem List Patient Active Problem List   Diagnosis Date Noted  . Chest pain 09/14/2014  . HYPERTENSION 05/06/2007  . CEREBROVASCULAR ACCIDENT, HX OF 05/06/2007    [name], PT, [name] 10/03/2016, 11:58 AM  [facility] [place], [place], [address] Phone: [phone]   Fax:  [phone]  Name: Zaman Ringold MRN: [id number] Date of Birth: October 01, 1941

## 2016-10-08 ENCOUNTER — Ambulatory Visit: Payer: Medicare Other | Admitting: Physical Therapy

## 2016-10-08 DIAGNOSIS — R2689 Other abnormalities of gait and mobility: Secondary | ICD-10-CM | POA: Diagnosis not present

## 2016-10-08 DIAGNOSIS — R262 Difficulty in walking, not elsewhere classified: Secondary | ICD-10-CM | POA: Diagnosis not present

## 2016-10-08 DIAGNOSIS — I69354 Hemiplegia and hemiparesis following cerebral infarction affecting left non-dominant side: Secondary | ICD-10-CM | POA: Diagnosis not present

## 2016-10-08 NOTE — Therapy (Signed)
[facility] [address]  [place], [place], [address] Phone: [phone]   Fax:  [phone]  Physical Therapy Treatment  Patient Details  Name: Danny Lara MRN: [id number] Date of Birth: 1941/06/14 Referring Provider: Jonathon Jordan, MD  Encounter Date: 10/08/2016      PT End of Session - 10/08/16 1110    Visit Number 8   Number of Visits 16   Date for PT Re-Evaluation 11/08/16   Authorization Type Medicare/Medicaid   PT Start Time 1110   PT Stop Time 1152   PT Time Calculation (min) 42 min   Activity Tolerance Patient tolerated treatment well   Behavior During Therapy [facility] for tasks assessed/performed      Past Medical History:  Diagnosis Date  . Hypertension, essential   . Stroke     Past Surgical History:  Procedure Laterality Date  . APPENDECTOMY    . boil removal Right 1997    behind ear  . EYE SURGERY Right 07-18-14    There were no vitals filed for this visit.      Subjective Assessment - 10/08/16 1115    Subjective No new c/o today.   Patient Stated Goals "To be 50% better."   Currently in Pain? No/denies                  [facility] Adult PT Treatment/Exercise - 10/08/16 1110      Ambulation/Gait   Ambulation/Gait Assistance 4: Min guard   Ambulation Distance (Feet) 80 Feet  30+50   Assistive device Small based quad cane   Gait Pattern Step-to pattern;Decreased arm swing - left;Decreased step length - right;Decreased step length - left;Decreased stance time - left;Decreased stride length;Decreased hip/knee flexion - left;Decreased dorsiflexion - left;Decreased weight shift to left;Left circumduction;Left genu recurvatum;Lateral trunk lean to right;Poor foot clearance - left;Trunk rotated posteriorly on left   Gait Comments Cues for posture and body orientation to neutral fwd alignment     Knee/Hip Exercises: Aerobic   Nustep lvl 5 x 6'     Knee/Hip Exercises: Seated   Long Arc Quad Left;15 reps;Weights   Long Arc Quad Weight 1 lbs.   Hamstring Curl Left;2 sets;15 reps   Hamstring Limitations yellow TB   Sit to Sand 10 reps;without UE support  R foot on blue foam oval to facilitate increased L LE effort     Knee/Hip Exercises: Supine   Short Arc Quad Sets Left;20 reps   Short Arc Quad Sets Limitations 1# cuuf wt   [employer] Limitations 5" hold + Hip ABD isometric with green TB   Straight Leg Raises Left;20 reps   Straight Leg Raises Limitations 1# cuff wt   Knee Flexion Both;20 reps   Knee Flexion Limitations Heels on peanut ball   Other Supine Knee/Hip Exercises Alt Hip ABD/ER with green TB x15                  PT Short Term Goals - 09/24/16 1109      PT SHORT TERM GOAL #1   Title Independent with initial HEP as indicated by 10/04/16   Status On-going     PT SHORT TERM GOAL #2   Title Ambulate with [facility] demonstrating appropriate sequencing & step pattern with cues <50% of time by 10/11/16   Status On-going           PT Long Term Goals - 09/24/16 1109      PT  LONG TERM GOAL #1   Title Independent with advanced HEP as indicated by 11/08/16   Status On-going     PT LONG TERM GOAL #2   Title Increase Berg to >/= 30/56 to improve stability with standing activities and decrease risk for falls by 11/08/16   Status On-going     PT LONG TERM GOAL #3   Title Ambulate with [facility] demonstrating appropriate sequencing & step pattern with cues <10% of time by 11/08/16   Status On-going     PT LONG TERM GOAL #4   Title Gait speed >/= 1.3 ft/sec to allow for limited community ambulation by 11/08/16   Status On-going               Plan - 10/08/16 1116    Clinical Impression Statement Continued L LE stretngthening focus with emphasis on good control and pacing with avoidance of use of momentum with particular focus of hamstring control. Pt continues to require cues to avoid holding his breath and to  maintain slower pace during exercises.   Rehab Potential Good   Clinical Impairments Affecting Rehab Potential Arthritis, HTN, h/o R shoulder AC joint pain   PT Treatment/Interventions Patient/family education;Neuromuscular re-education;Balance training;Therapeutic exercise;Therapeutic activities;Functional mobility training;Gait training;ADLs/Self Care Home Management   PT Next Visit Plan L LE strengthening; Balance training; Continue gait training once orthotic fabricated   Consulted and Agree with Plan of Care Patient;Family member/caregiver   Family Member Consulted son-in-law      Patient will benefit from skilled therapeutic intervention in order to improve the following deficits and impairments:  Decreased strength, Decreased range of motion, Decreased balance, Decreased coordination, Impaired tone, Difficulty walking, Abnormal gait, Decreased activity tolerance  Visit Diagnosis: Hemiplegia and hemiparesis following cerebral infarction affecting left non-dominant side (HCC)  Other abnormalities of gait and mobility  Difficulty in walking, not elsewhere classified     Problem List Patient Active Problem List   Diagnosis Date Noted  . Chest pain 09/14/2014  . HYPERTENSION 05/06/2007  . CEREBROVASCULAR ACCIDENT, HX OF 05/06/2007    [name], PT, [name] 10/08/2016, 12:02 PM  [facility] [address]  [place], [place], [address] Phone: [phone]   Fax:  [phone]  Name: Danny Lara MRN: [id number] Date of Birth: [birth year]

## 2016-10-10 ENCOUNTER — Ambulatory Visit: Payer: Medicare Other | Admitting: Physical Therapy

## 2016-10-10 DIAGNOSIS — R2689 Other abnormalities of gait and mobility: Secondary | ICD-10-CM

## 2016-10-10 DIAGNOSIS — R262 Difficulty in walking, not elsewhere classified: Secondary | ICD-10-CM | POA: Diagnosis not present

## 2016-10-10 DIAGNOSIS — I69354 Hemiplegia and hemiparesis following cerebral infarction affecting left non-dominant side: Secondary | ICD-10-CM | POA: Diagnosis not present

## 2016-10-10 NOTE — Therapy (Signed)
[facility] [address]  [place], [place], [address] Phone: [phone]   Fax:  [phone]  Physical Therapy Treatment  Patient Details  Name: Danny Lara MRN: [id number] Date of Birth: [birth year] Referring Provider: Jonathon Jordan, MD  Encounter Date: 10/10/2016      PT End of Session - 10/10/16 1100    Visit Number 9   Number of Visits 16   Date for PT Re-Evaluation 11/08/16   Authorization Type Medicare/Medicaid   PT Start Time 1100   PT Stop Time 1144   PT Time Calculation (min) 44 min   Activity Tolerance Patient tolerated treatment well   Behavior During Therapy [facility] for tasks assessed/performed      Past Medical History:  Diagnosis Date  . Hypertension, essential   . Stroke     Past Surgical History:  Procedure Laterality Date  . APPENDECTOMY    . boil removal Right 1997    behind ear  . EYE SURGERY Right 07-18-14    There were no vitals filed for this visit.      Subjective Assessment - 10/10/16 1100    Subjective Pt reports some increased muscle soreness after last therapy session which has resolved, buit noting L knee pain again today.   Patient Stated Goals "To be 50% better."   Currently in Pain? Yes   Pain Score 5    Pain Location Knee   Pain Orientation Left                 [facility] Adult PT Treatment/Exercise - 10/10/16 1100      Ambulation/Gait   Ambulation/Gait Assistance 4: Min guard   Ambulation Distance (Feet) 100 Feet  40+60   Assistive device Small based quad cane   Gait Pattern Step-to pattern;Decreased arm swing - left;Decreased step length - right;Decreased step length - left;Decreased stance time - left;Decreased stride length;Decreased hip/knee flexion - left;Decreased dorsiflexion - left;Decreased weight shift to left;Left circumduction;Left genu recurvatum;Lateral trunk lean to right;Poor foot clearance - left;Trunk rotated posteriorly on  left   Gait Comments Cues for posture and body orientation to neutral fwd alignment     Knee/Hip Exercises: Aerobic   Nustep lvl 5 x 6'  LE only             Balance Exercises - 10/10/16 1100      Balance Exercises: Standing   Standing Eyes Opened Solid surface;Narrow base of support (BOS);Time;Foam/compliant surface;3 reps  2x60"- 2nd time in Rhomberg stance, 3rd time on Airex pad   Standing Eyes Closed Solid surface;Time;Narrow base of support (BOS);1 rep;20 secs  Rhomberg stance    Step Ups Forward;UE support 1  Step-touch to 4" step   Partial Tandem Stance Eyes open;Time  60" semi-tandem Rhomberg stance   Cone Rotation Solid surface;Intermittent upper extremity assist;Right turn;Left turn;A/P;R/L   Marching Limitations Marching in place on Airex with NBQC support & CGA x10   Other Standing Exercises Trunk rotation x5 (SBA), Lat weight shift x10 (CGA/min assist); Dynamic reaching beyond BOS in all directions             PT Short Term Goals - 09/24/16 1109      PT SHORT TERM GOAL #1   Title Independent with initial HEP as indicated by 10/04/16   Status On-going     PT SHORT TERM GOAL #2   Title Ambulate with [facility] demonstrating appropriate sequencing & step pattern with cues <50% of time by 10/11/16  Status On-going           PT Long Term Goals - 09/24/16 1109      PT LONG TERM GOAL #1   Title Independent with advanced HEP as indicated by 11/08/16   Status On-going     PT LONG TERM GOAL #2   Title Increase Berg to >/= 30/56 to improve stability with standing activities and decrease risk for falls by 11/08/16   Status On-going     PT LONG TERM GOAL #3   Title Ambulate with [facility] demonstrating appropriate sequencing & step pattern with cues <10% of time by 11/08/16   Status On-going     PT LONG TERM GOAL #4   Title Gait speed >/= 1.3 ft/sec to allow for limited community ambulation by 11/08/16   Status On-going               Plan - 10/10/16  1100    Clinical Impression Statement Progressed balance with addition of compliant surfaces and increased stepping activities. Pt continues to avoid weight shift to L LE to point of SLS due to fear/apprehension of LE not able to support him but no buckling observed.   Rehab Potential Good   Clinical Impairments Affecting Rehab Potential Arthritis, HTN, h/o R shoulder AC joint pain   PT Treatment/Interventions Patient/family education;Neuromuscular re-education;Balance training;Therapeutic exercise;Therapeutic activities;Functional mobility training;Gait training;ADLs/Self Care Home Management   PT Next Visit Plan L LE strengthening; Balance training; Continue gait training once orthotic fabricated   Consulted and Agree with Plan of Care Patient;Family member/caregiver   Family Member Consulted son-in-law      Patient will benefit from skilled therapeutic intervention in order to improve the following deficits and impairments:  Decreased strength, Decreased range of motion, Decreased balance, Decreased coordination, Impaired tone, Difficulty walking, Abnormal gait, Decreased activity tolerance  Visit Diagnosis: Hemiplegia and hemiparesis following cerebral infarction affecting left non-dominant side (HCC)  Other abnormalities of gait and mobility  Difficulty in walking, not elsewhere classified     Problem List Patient Active Problem List   Diagnosis Date Noted  . Chest pain 09/14/2014  . HYPERTENSION 05/06/2007  . CEREBROVASCULAR ACCIDENT, HX OF 05/06/2007    [name], PT, [name] 10/10/2016, 12:05 PM  [facility] [address]  [place], [place], [address] Phone: [phone]   Fax:  [phone]  Name: Danny Lara MRN: [id number] Date of Birth: 1941/05/29

## 2016-10-23 ENCOUNTER — Ambulatory Visit: Payer: Medicare Other | Admitting: Physical Therapy

## 2016-10-23 DIAGNOSIS — I69354 Hemiplegia and hemiparesis following cerebral infarction affecting left non-dominant side: Secondary | ICD-10-CM

## 2016-10-23 DIAGNOSIS — R2689 Other abnormalities of gait and mobility: Secondary | ICD-10-CM | POA: Diagnosis not present

## 2016-10-23 DIAGNOSIS — R262 Difficulty in walking, not elsewhere classified: Secondary | ICD-10-CM | POA: Diagnosis not present

## 2016-10-23 NOTE — Therapy (Addendum)
[facility] [address]  [place], [place], [address] Phone: [phone]   Fax:  [phone]  Physical Therapy Treatment  Patient Details  Name: Danny Lara MRN: [id number] Date of Birth: Jun 27, 1941 Referring Provider: Jonathon Jordan, MD  Encounter Date: 10/23/2016      PT End of Session - 10/23/16 1101    Visit Number 10   Number of Visits 16   Date for PT Re-Evaluation 11/08/16   Authorization Type Medicare/Medicaid   PT Start Time 1101   PT Stop Time 1148   PT Time Calculation (min) 47 min   Activity Tolerance Patient tolerated treatment well   Behavior During Therapy [facility] for tasks assessed/performed      Past Medical History:  Diagnosis Date  . Hypertension, essential   . Stroke     Past Surgical History:  Procedure Laterality Date  . APPENDECTOMY    . boil removal Right 1997    behind ear  . EYE SURGERY Right 07-18-14    There were no vitals filed for this visit.      Subjective Assessment - 10/23/16 1101    Subjective Pt reports he is not wearing the new AFO because the brace is too heavy and needs further modification. He has an appt for modification on Friday 10/25/16.   Patient Stated Goals "To be 50% better."   Currently in Pain? Yes   Pain Score 6   with movement/walking   Pain Location Knee   Pain Orientation Left            [facility] PT Assessment - 10/23/16 1101      Assessment   Medical Diagnosis CVA - L hemiparesis   Referring Provider Jonathon Jordan, MD   Onset Date/Surgical Date 03/29/98   Next MD Visit 2018     Observation/Other Assessments   Focus on Therapeutic Outcomes (FOTO)  Neuromuscular disorder - 45% (55% limitation);     Standardized Balance Assessment   Standardized Balance Assessment Berg Balance Test     Berg Balance Test   Sit to Stand Able to stand without using hands and stabilize independently   Standing Unsupported Able to stand  safely 2 minutes   Sitting with Back Unsupported but Feet Supported on Floor or Stool Able to sit safely and securely 2 minutes   Stand to Sit Sits safely with minimal use of hands   Transfers Able to transfer safely, definite need of hands   Standing Unsupported with Eyes Closed Able to stand 10 seconds with supervision   Standing Ubsupported with Feet Together Needs help to attain position but able to stand for 30 seconds with feet together   From Standing, Reach Forward with Outstretched Arm Can reach forward >5 cm safely (2")   From Standing Position, Pick up Object from Floor Able to pick up shoe, needs supervision   From Standing Position, Turn to Look Behind Over each Shoulder Needs supervision when turning   Turn 360 Degrees Needs assistance while turning   Standing Unsupported, Alternately Place Feet on Step/Stool Needs assistance to keep from falling or unable to try   Standing Unsupported, One Foot in Front Needs help to step but can hold 15 seconds   Standing on One Leg Tries to lift leg/unable to hold 3 seconds but remains standing independently   Total Score 31     Timed Up and Go Test   TUG Normal TUG   Normal TUG (seconds) 39.19  [facility] Adult PT Treatment/Exercise - 10/23/16 1101      Ambulation/Gait   Ambulation/Gait Assistance 4: Min guard;5: Supervision   Ambulation Distance (Feet) 100 Feet   Assistive device Small based quad cane   Gait Pattern Step-to pattern;Decreased arm swing - left;Decreased step length - right;Decreased step length - left;Decreased stance time - left;Decreased stride length;Decreased hip/knee flexion - left;Decreased dorsiflexion - left;Decreased weight shift to left;Left circumduction;Left genu recurvatum;Lateral trunk lean to right;Poor foot clearance - left;Trunk rotated posteriorly on left   Ambulation Surface Unlevel;Indoor   Gait Comments Cues for posture and body orientation to neutral fwd alignment, and hip  & knee flexion for improved foot clearance.     Knee/Hip Exercises: Aerobic   Nustep lvl 5 x 6'  LE only; reduced to lvl 4 after 2.5 min d/t c/o L knee pain     Knee/Hip Exercises: Standing   Hip Flexion Both;20 reps;Knee bent   Hip Flexion Limitations R UE support on counter; cues for upright posture; 1# cuff wt on L   Hip Abduction Both;10 reps;Knee straight   Abduction Limitations R UE support on counter; cues for upright posture; 1# cuff wt on L   Hip Extension Both;10 reps;Knee straight   Extension Limitations R UE support on counter; cues for upright posture; 1# cuff wt on L                  PT Short Term Goals - 10/23/16 1125      PT SHORT TERM GOAL #1   Title Independent with initial HEP as indicated by 10/04/16   Status Achieved     PT SHORT TERM GOAL #2   Title Ambulate with [facility] demonstrating appropriate sequencing & step pattern with cues <50% of time by 10/11/16   Status Achieved           PT Long Term Goals - 10/23/16 1125      PT LONG TERM GOAL #1   Title Independent with advanced HEP as indicated by 11/08/16   Status On-going     PT LONG TERM GOAL #2   Title Increase Berg to >/= 30/56 to improve stability with standing activities and decrease risk for falls by 11/08/16   Status On-going     PT LONG TERM GOAL #3   Title Ambulate with [facility] demonstrating appropriate sequencing & step pattern with cues <10% of time by 11/08/16   Status On-going     PT LONG TERM GOAL #4   Title Gait speed >/= 1.3 ft/sec to allow for limited community ambulation by 11/08/16   Status On-going               Plan - 10/23/16 1325    Clinical Impression Statement Pt demonstrating good progress with PT with substantial improvements noted in TUG time and Berg balance testing. Pt demonstrating improved awareness of proper alignment and step pattern but continues to lack adequate foor clearance on L. Pt has received his new AFO, but did not wear/bring it to  therapy today as he states it needs adjustment by the orthotist (appt scheduled for Friday 10/25/16). Pt will continue to benefit from skilled PT for further strengthening, balance and gait training to increased safety in home environment.   Rehab Potential Good   Clinical Impairments Affecting Rehab Potential Arthritis, HTN, h/o R shoulder AC joint pain   PT Treatment/Interventions Patient/family education;Neuromuscular re-education;Balance training;Therapeutic exercise;Therapeutic activities;Functional mobility training;Gait training;ADLs/Self Care Home Management   PT Next Visit Plan L LE strengthening;   Balance training; Continue gait training once orthotic fabricated   Consulted and Agree with Plan of Care Patient;Family member/caregiver   Family Member Consulted son-in-law      Patient will benefit from skilled therapeutic intervention in order to improve the following deficits and impairments:  Decreased strength, Decreased range of motion, Decreased balance, Decreased coordination, Impaired tone, Difficulty walking, Abnormal gait, Decreased activity tolerance  Visit Diagnosis: Hemiplegia and hemiparesis following cerebral infarction affecting left non-dominant side (HCC)  Other abnormalities of gait and mobility  Difficulty in walking, not elsewhere classified       G-Codes - 10/30/2016 1148    Functional Assessment Tool Used Berg = 31/56 (44.6% Impaired) + Clinical judgement   Functional Limitation Mobility: Walking and moving around   Mobility: Walking and Moving Around Current Status [phone]) At least 40 percent but less than 60 percent impaired, limited or restricted   Mobility: Walking and Moving Around Goal Status [phone]) At least 40 percent but less than 60 percent impaired, limited or restricted      Problem List Patient Active Problem List   Diagnosis Date Noted  . Chest pain 09/14/2014  . HYPERTENSION 05/06/2007  . CEREBROVASCULAR ACCIDENT, HX OF 05/06/2007     [name], PT, [name] 10/30/2016, 1:48 PM  [facility] [address]  [place], [place], [address] Phone: [phone]   Fax:  [phone]  Name: Danny Lara MRN: [id number] Date of Birth: [birth year]   Physical Therapy Progress Note  Dates of Reporting Period: 09/11/16 to Oct 30, 2016  Objective Reports of Subjective Statement: Pt reporting increased static standing balance and increased ease of ambulation around home.  Objective Measurements: Pt demonstrating good progress with PT with substantial improvements noted in TUG time and Berg balance testing (refer to above flowsheet). Pt demonstrating improved awareness of proper alignment and step pattern but continues to lack adequate foor clearance on L. Pt has received his new AFO, but unable to assess gait with AFO as pt did not bring/wear it to PT session today, secondary to pending adjustments on Friday 10/25/16.  Goal Update: All STG's met. LTG's ongoing.   Plan: Pt will continue to benefit from skilled PT for further strengthening, balance and gait training with new AFO. Pt may require extension of POC pending duration of AFO adjustments.  Reason Skilled Services are Required: Increase safety in home environment.

## 2016-10-24 ENCOUNTER — Ambulatory Visit: Payer: Medicare Other | Admitting: Physical Therapy

## 2016-10-24 DIAGNOSIS — I69354 Hemiplegia and hemiparesis following cerebral infarction affecting left non-dominant side: Secondary | ICD-10-CM | POA: Diagnosis not present

## 2016-10-24 DIAGNOSIS — R262 Difficulty in walking, not elsewhere classified: Secondary | ICD-10-CM

## 2016-10-24 DIAGNOSIS — R2689 Other abnormalities of gait and mobility: Secondary | ICD-10-CM

## 2016-10-24 NOTE — Therapy (Signed)
[facility] [address]  [place], [place], [address] Phone: [phone]   Fax:  [phone]  Physical Therapy Treatment  Patient Details  Name: Danny Lara MRN: [id number] Date of Birth: Jul 19, 1941 Referring Provider: Jonathon Jordan, MD  Encounter Date: 10/24/2016      PT End of Session - 10/24/16 1017    Visit Number 11   Number of Visits 16   Date for PT Re-Evaluation 11/08/16   Authorization Type Medicare/Medicaid   PT Start Time 1017   PT Stop Time 1101   PT Time Calculation (min) 44 min   Activity Tolerance Patient tolerated treatment well   Behavior During Therapy [facility] for tasks assessed/performed      Past Medical History:  Diagnosis Date  . Hypertension, essential   . Stroke     Past Surgical History:  Procedure Laterality Date  . APPENDECTOMY    . boil removal Right 1997    behind ear  . EYE SURGERY Right 07-18-14    There were no vitals filed for this visit.      Subjective Assessment - 10/24/16 1021    Subjective Pt forgot to bring his new AFO again today. Will see orthotist tomorrow for modifications.   Patient Stated Goals "To be 50% better."   Currently in Pain? No/denies                [facility] Adult PT Treatment/Exercise - 10/24/16 1017      Knee/Hip Exercises: Aerobic   Nustep lvl 5 x 5'     Knee/Hip Exercises: Standing   Hip Flexion Both;20 reps;Knee bent   Hip Flexion Limitations R UE support on NBQC; cues for upright posture; 1# cuff wt on L, 3# of R     Knee/Hip Exercises: Seated   Ball Squeeze 15x5"   Other Seated Knee/Hip Exercises L hip ADD/IR with yellow TB 15x3"   Marching Both;15 reps;Weights   Marching Weights 2 lbs.  on L, 3# on R   Hamstring Curl Left;15 reps   Hamstring Limitations yellow TB     Knee/Hip Exercises: Supine   Hip Adduction Isometric Left;15 reps   Hip Adduction Isometric Limitations hooklying ADD/IR with yellow TB   Single Leg Bridge Left;10 reps  R LE supported on peanut ball   Straight Leg Raises Left;20 reps   Straight Leg Raises Limitations 2#   Knee Flexion Both;20 reps   Knee Flexion Limitations Heels on peanut ball   Other Supine Knee/Hip Exercises Hooklying L LE march with 2# ankle cuff wt & looped blue TB on thighs x10   Other Supine Knee/Hip Exercises Manually resisted hooklying L hip flexion/ADD 2x10                  PT Short Term Goals - 10/23/16 1125      PT SHORT TERM GOAL #1   Title Independent with initial HEP as indicated by 10/04/16   Status Achieved     PT SHORT TERM GOAL #2   Title Ambulate with [facility] demonstrating appropriate sequencing & step pattern with cues <50% of time by 10/11/16   Status Achieved           PT Long Term Goals - 10/23/16 1125      PT LONG TERM GOAL #1   Title Independent with advanced HEP as indicated by 11/08/16   Status On-going     PT LONG TERM GOAL #2   Title Increase Berg to >/=  30/56 to improve stability with standing activities and decrease risk for falls by 11/08/16   Status On-going     PT LONG TERM GOAL #3   Title Ambulate with [facility] demonstrating appropriate sequencing & step pattern with cues <10% of time by 11/08/16   Status On-going     PT LONG TERM GOAL #4   Title Gait speed >/= 1.3 ft/sec to allow for limited community ambulation by 11/08/16   Status On-going               Plan - 10/24/16 1022    Clinical Impression Statement Pt forgot to bring new AFO again, therefore continued focus on L LE strengthenging to improve tolerance for added weight of new AFO. With hip/knee flexion, pt demonstrating tendency for hip ER & ABD, therefore targeted increased hip ADD & IR to allow for more neutral alignment of LE during functional movement patterns.   Rehab Potential Good   Clinical Impairments Affecting Rehab Potential Arthritis, HTN, h/o R shoulder AC joint pain   PT Treatment/Interventions Patient/family  education;Neuromuscular re-education;Balance training;Therapeutic exercise;Therapeutic activities;Functional mobility training;Gait training;ADLs/Self Care Home Management   PT Next Visit Plan L LE strengthening; Balance training; Continue gait training once orthotic fabricated   Consulted and Agree with Plan of Care Patient;Family member/caregiver   Family Member Consulted son-in-law      Patient will benefit from skilled therapeutic intervention in order to improve the following deficits and impairments:  Decreased strength, Decreased range of motion, Decreased balance, Decreased coordination, Impaired tone, Difficulty walking, Abnormal gait, Decreased activity tolerance  Visit Diagnosis: Hemiplegia and hemiparesis following cerebral infarction affecting left non-dominant side (HCC)  Other abnormalities of gait and mobility  Difficulty in walking, not elsewhere classified       G-Codes - 11/04/2016 1148    Functional Assessment Tool Used Berg = 31/56 (44.6% Impaired) + Clinical judgement   Functional Limitation Mobility: Walking and moving around   Mobility: Walking and Moving Around Current Status [phone]) At least 40 percent but less than 60 percent impaired, limited or restricted   Mobility: Walking and Moving Around Goal Status [phone]) At least 40 percent but less than 60 percent impaired, limited or restricted      Problem List Patient Active Problem List   Diagnosis Date Noted  . Chest pain 09/14/2014  . HYPERTENSION 05/06/2007  . CEREBROVASCULAR ACCIDENT, HX OF 05/06/2007    [name], PT, [name] 10/24/2016, 12:21 PM  [facility] [address]  [place], [place], [address] Phone: [phone]   Fax:  [phone]  Name: Danny Lara MRN: [id number] Date of Birth: 1941-01-20

## 2016-10-29 ENCOUNTER — Ambulatory Visit: Payer: Medicare Other | Attending: Family Medicine | Admitting: Physical Therapy

## 2016-10-29 DIAGNOSIS — I69354 Hemiplegia and hemiparesis following cerebral infarction affecting left non-dominant side: Secondary | ICD-10-CM | POA: Diagnosis not present

## 2016-10-29 DIAGNOSIS — R2689 Other abnormalities of gait and mobility: Secondary | ICD-10-CM | POA: Diagnosis not present

## 2016-10-29 DIAGNOSIS — R262 Difficulty in walking, not elsewhere classified: Secondary | ICD-10-CM | POA: Diagnosis not present

## 2016-10-29 NOTE — Therapy (Signed)
[facility] [address]  [place], [place], [address] Phone: [phone]   Fax:  [phone]  Physical Therapy Treatment  Patient Details  Name: Danny Lara MRN: [id number] Date of Birth: 09/16/1941 Referring Provider: Jonathon Jordan, MD  Encounter Date: 10/29/2016      PT End of Session - 10/29/16 1018    Visit Number 12   Number of Visits 16   Date for PT Re-Evaluation 11/08/16   Authorization Type Medicare/Medicaid   PT Start Time 1018   PT Stop Time 1059   PT Time Calculation (min) 41 min   Activity Tolerance Patient tolerated treatment well   Behavior During Therapy [facility] for tasks assessed/performed      Past Medical History:  Diagnosis Date  . Hypertension, essential   . Stroke     Past Surgical History:  Procedure Laterality Date  . APPENDECTOMY    . boil removal Right 1997    behind ear  . EYE SURGERY Right 07-18-14    There were no vitals filed for this visit.      Subjective Assessment - 10/29/16 1023    Subjective Pt reports he is unable to don/doff his new AFO w/o assistance right now. Continues to report L knee pain due to hyperextension.   Patient Stated Goals "To be 50% better."   Currently in Pain? Yes   Pain Score 6    Pain Location Knee   Pain Orientation Left                   [facility] Adult PT Treatment/Exercise - 10/29/16 1018      Ambulation/Gait   Ambulation/Gait Assistance 4: Min guard   Ambulation Distance (Feet) 180 Feet  x2   Assistive device Small based quad cane   Gait Pattern Step-to pattern;Trunk rotated posteriorly on left;Decreased step length - left;Decreased step length - right;Decreased stride length;Lateral trunk lean to right   Gait Comments Worked on minimizing trunk rotation and keeping hips parallel when advancing L LE, with repeated reminders for correct sequencing of NBQC. Mirror used for visual feedback for neutral pelvic  alignment.     Self-Care   Self-Care ADL's   ADL's worked on strategies for donning/doffing new AFO with repeated practice - best stratgey to line up AFO while ankle crossed on R knee and use loop on back of AFO to guide foot into AFO while lowerin foot to floor and stepping down to finalize position of AFO                  PT Short Term Goals - 10/23/16 1125      PT SHORT TERM GOAL #1   Title Independent with initial HEP as indicated by 10/04/16   Status Achieved     PT SHORT TERM GOAL #2   Title Ambulate with [facility] demonstrating appropriate sequencing & step pattern with cues <50% of time by 10/11/16   Status Achieved           PT Long Term Goals - 10/23/16 1125      PT LONG TERM GOAL #1   Title Independent with advanced HEP as indicated by 11/08/16   Status On-going     PT LONG TERM GOAL #2   Title Increase Berg to >/= 30/56 to improve stability with standing activities and decrease risk for falls by 11/08/16   Status On-going     PT LONG TERM GOAL #3   Title Ambulate  with [facility] demonstrating appropriate sequencing & step pattern with cues <10% of time by 11/08/16   Status On-going     PT LONG TERM GOAL #4   Title Gait speed >/= 1.3 ft/sec to allow for limited community ambulation by 11/08/16   Status On-going               Plan - 10/29/16 1039    Clinical Impression Statement Pt brought new AFO to therapy today, therefore session focused on strategies to increase independence with donning and doffing AFO as pt states he is unable to to don AFO w/o assistance currently. Also resumed gait training reinforcing neutral hip alignment and proper sequencing of steps with use of mirror for visual feedback.   Rehab Potential Good   Clinical Impairments Affecting Rehab Potential Arthritis, HTN, h/o R shoulder AC joint pain   PT Treatment/Interventions Patient/family education;Neuromuscular re-education;Balance training;Therapeutic exercise;Therapeutic  activities;Functional mobility training;Gait training;ADLs/Self Care Home Management   PT Next Visit Plan L LE strengthening; Balance training; Donning/doffing strategies for AFO; Gait training with new AFO   Consulted and Agree with Plan of Care Patient;Family member/caregiver   Family Member Consulted son-in-law      Patient will benefit from skilled therapeutic intervention in order to improve the following deficits and impairments:  Decreased strength, Decreased range of motion, Decreased balance, Decreased coordination, Impaired tone, Difficulty walking, Abnormal gait, Decreased activity tolerance  Visit Diagnosis: Hemiplegia and hemiparesis following cerebral infarction affecting left non-dominant side (HCC)  Other abnormalities of gait and mobility  Difficulty in walking, not elsewhere classified     Problem List Patient Active Problem List   Diagnosis Date Noted  . Chest pain 09/14/2014  . HYPERTENSION 05/06/2007  . CEREBROVASCULAR ACCIDENT, HX OF 05/06/2007    [name], PT, [name] 10/29/2016, 12:34 PM  [facility] [address]  [place], [place], [address] Phone: [phone]   Fax:  [phone]  Name: Danny Lara MRN: [id number] Date of Birth: [birth year]

## 2016-10-31 ENCOUNTER — Ambulatory Visit: Payer: Medicare Other | Admitting: Physical Therapy

## 2016-10-31 DIAGNOSIS — R262 Difficulty in walking, not elsewhere classified: Secondary | ICD-10-CM

## 2016-10-31 DIAGNOSIS — I69354 Hemiplegia and hemiparesis following cerebral infarction affecting left non-dominant side: Secondary | ICD-10-CM

## 2016-10-31 DIAGNOSIS — R2689 Other abnormalities of gait and mobility: Secondary | ICD-10-CM | POA: Diagnosis not present

## 2016-10-31 NOTE — Therapy (Signed)
[facility] [address]  [place], [place], [address] Phone: [phone]   Fax:  [phone]  Physical Therapy Treatment  Patient Details  Name: Danny Lara MRN: [id number] Date of Birth: 1941/06/23 Referring Provider: Jonathon Jordan, MD  Encounter Date: 10/31/2016      PT End of Session - 10/31/16 1017    Visit Number 13   Number of Visits 16   Date for PT Re-Evaluation 11/08/16   Authorization Type Medicare/Medicaid   PT Start Time 1017   PT Stop Time 1101   PT Time Calculation (min) 44 min   Activity Tolerance Patient tolerated treatment well   Behavior During Therapy [facility] for tasks assessed/performed      Past Medical History:  Diagnosis Date  . Hypertension, essential   . Stroke     Past Surgical History:  Procedure Laterality Date  . APPENDECTOMY    . boil removal Right 1997    behind ear  . EYE SURGERY Right 07-18-14    There were no vitals filed for this visit.      Subjective Assessment - 10/31/16 1023    Subjective Pt brings his ne AFO to PT in a bag today, stating he is still unable to donn it w/o assistance despite 20 minute long attempt this morning.   Patient Stated Goals "To be 50% better."   Currently in Pain? Yes   Pain Score --  5-6/10   Pain Location Knee   Pain Orientation Left   Pain Descriptors / Indicators Discomfort                         [facility] Adult PT Treatment/Exercise - 10/31/16 1017      Ambulation/Gait   Ambulation/Gait Assistance 4: Min guard   Ambulation Distance (Feet) 180 Feet  x2, 229ft   Assistive device Small based quad cane   Gait Pattern Step-to pattern;Trunk rotated posteriorly on left;Decreased step length - left;Decreased step length - right;Decreased stride length;Lateral trunk lean to right   Ambulation Surface Level;Indoor;Outdoor   Curb 4: Paramedic Details (indicate cue type and reason) down curb with  CGA to ensure adequate control   Gait Comments Worked on minimizing trunk rotation and keeping hips parallel with L LE in neutral rotation when advancing L LE, with repeated reminders for correct sequencing of NBQC. Mirror used for visual feedback for neutral pelvic alignment.     Self-Care   Self-Care ADL's   ADL's Worked on strategies for donning/doffing new AFO with repeated practice - best stratgey continues to be to line up AFO while ankle crossed on R knee and use loop on back of AFO to guide foot into AFO while lowering foot to floor and stepping down to finalize position of AFO. Pt continues to have difficulty getting toes past tongue of shoe before stepping down into brace. Safety pin added to end of shoe strap to prevent strap from pulling out of hole.     Knee/Hip Exercises: Aerobic   Nustep lvl 6 x 5'     Knee/Hip Exercises: Standing   Functional Squat 15 reps;3 seconds   Functional Squat Limitations + hip ADD ball squeeze     Knee/Hip Exercises: Seated   Abduction/Adduction  Left;15 reps   Abd/Adduction Limitations green TB                  PT Short Term Goals - 10/23/16 1125  PT SHORT TERM GOAL #1   Title Independent with initial HEP as indicated by 10/04/16   Status Achieved     PT SHORT TERM GOAL #2   Title Ambulate with [facility] demonstrating appropriate sequencing & step pattern with cues <50% of time by 10/11/16   Status Achieved           PT Long Term Goals - 10/23/16 1125      PT LONG TERM GOAL #1   Title Independent with advanced HEP as indicated by 11/08/16   Status On-going     PT LONG TERM GOAL #2   Title Increase Berg to >/= 30/56 to improve stability with standing activities and decrease risk for falls by 11/08/16   Status On-going     PT LONG TERM GOAL #3   Title Ambulate with [facility] demonstrating appropriate sequencing & step pattern with cues <10% of time by 11/08/16   Status On-going     PT LONG TERM GOAL #4   Title Gait speed  >/= 1.3 ft/sec to allow for limited community ambulation by 11/08/16   Status On-going               Plan - 10/31/16 1027    Clinical Impression Statement Pt continues to be frustrated by difficulty donning AFO, therefore continued to review and modify technique with reminders to ensure toes past tongue of shoe before attempting to step down into brace. Pt demonstrating improving rotational control of L hip with gait in new AFO but continues to require intermittent cues for neutral pelvic alignment, as pt tends to let L LE lag behind.   Rehab Potential Good   Clinical Impairments Affecting Rehab Potential Arthritis, HTN, h/o R shoulder AC joint pain   PT Treatment/Interventions Patient/family education;Neuromuscular re-education;Balance training;Therapeutic exercise;Therapeutic activities;Functional mobility training;Gait training;ADLs/Self Care Home Management   PT Next Visit Plan L LE strengthening; Balance training; Donning/doffing strategies for AFO; Gait training with new AFO   Consulted and Agree with Plan of Care Patient;Family member/caregiver   Family Member Consulted son-in-law      Patient will benefit from skilled therapeutic intervention in order to improve the following deficits and impairments:  Decreased strength, Decreased range of motion, Decreased balance, Decreased coordination, Impaired tone, Difficulty walking, Abnormal gait, Decreased activity tolerance  Visit Diagnosis: Hemiplegia and hemiparesis following cerebral infarction affecting left non-dominant side (HCC)  Other abnormalities of gait and mobility  Difficulty in walking, not elsewhere classified     Problem List Patient Active Problem List   Diagnosis Date Noted  . Chest pain 09/14/2014  . HYPERTENSION 05/06/2007  . CEREBROVASCULAR ACCIDENT, HX OF 05/06/2007    [name], PT, [name] 10/31/2016, 2:13 PM  [facility] [address]   [place], [place], [address] Phone: [phone]   Fax:  [phone]  Name: Danny Lara MRN: [id number] Date of Birth: 1941-03-29

## 2016-11-05 ENCOUNTER — Ambulatory Visit: Payer: Medicare Other | Admitting: Physical Therapy

## 2016-11-05 DIAGNOSIS — R2689 Other abnormalities of gait and mobility: Secondary | ICD-10-CM | POA: Diagnosis not present

## 2016-11-05 DIAGNOSIS — I69354 Hemiplegia and hemiparesis following cerebral infarction affecting left non-dominant side: Secondary | ICD-10-CM

## 2016-11-05 DIAGNOSIS — R262 Difficulty in walking, not elsewhere classified: Secondary | ICD-10-CM

## 2016-11-05 NOTE — Therapy (Signed)
[facility] [address]  [place], [place], [address] Phone: [phone]   Fax:  [phone]  Physical Therapy Treatment  Patient Details  Name: Danny Lara MRN: [id number] Date of Birth: [birth year] Referring Provider: Jonathon Jordan, MD  Encounter Date: 11/05/2016      PT End of Session - 11/05/16 1018    Visit Number 14   Number of Visits 26   Date for PT Re-Evaluation 12/20/16   Authorization Type Medicare/Medicaid   PT Start Time 1018   PT Stop Time 1109   PT Time Calculation (min) 51 min   Activity Tolerance Patient tolerated treatment well   Behavior During Therapy [facility] for tasks assessed/performed      Past Medical History:  Diagnosis Date  . Hypertension, essential   . Stroke     Past Surgical History:  Procedure Laterality Date  . APPENDECTOMY    . boil removal Right 1997    behind ear  . EYE SURGERY Right 07-18-14    There were no vitals filed for this visit.      Subjective Assessment - 11/05/16 1034    Subjective Pt continues to c/o difficulty donning new AFO w/o assistance. Also feels tat AFO puts his weight too much on his heel causing his leg to pivot into ER with standing and walking. Calf cuff on AFO creating pressure on sides of calf.   Patient Stated Goals "To be 50% better."   Currently in Pain? No/denies            [facility] PT Assessment - 11/05/16 1018      Assessment   Medical Diagnosis CVA - L hemiparesis   Referring Provider Jonathon Jordan, MD   Onset Date/Surgical Date 03/29/98   Hand Dominance Right   Next MD Visit 2018   Prior Therapy initially after CVA, none recently     Prior Function   Level of Independence Independent with basic ADLs;Independent with household mobility with device   Vocation Retired   Leisure read, watch TV - mostly sedentary     Ambulation/Gait   Ambulation/Gait Assistance 4: Min guard   Ambulation Distance (Feet) 100  Feet   Assistive device Small based quad cane  L AFO   Gait Pattern Step-to pattern;Trunk rotated posteriorly on left;Decreased step length - left;Decreased step length - right;Decreased stride length;Lateral trunk lean to right;Abducted - left  L LE ER   Ambulation Surface Level;Indoor   Gait velocity 0.89 ft/sec  36.75" w/ new AFO & SBQC     Standardized Balance Assessment   Standardized Balance Assessment Berg Balance Test;Timed Up and Go Test;10 meter walk test   10 Meter Walk 36.75" - 0.89 ft/sec     Berg Balance Test   Sit to Stand Able to stand without using hands and stabilize independently   Standing Unsupported Able to stand safely 2 minutes   Sitting with Back Unsupported but Feet Supported on Floor or Stool Able to sit safely and securely 2 minutes   Stand to Sit Sits safely with minimal use of hands   Transfers Able to transfer safely, definite need of hands   Standing Unsupported with Eyes Closed Able to stand 10 seconds with supervision   Standing Ubsupported with Feet Together Needs help to attain position but able to stand for 30 seconds with feet together   From Standing, Reach Forward with Outstretched Arm Can reach forward >5 cm safely (2")   From Standing Position, Pick  up Object from [place] to pick up shoe, needs supervision   From Standing Position, Turn to Look Behind Over each Shoulder Needs supervision when turning   Turn 360 Degrees Needs assistance while turning   Standing Unsupported, Alternately Place Feet on Step/Stool Needs assistance to keep from falling or unable to try   Standing Unsupported, One Foot in [place] to take small step independently and hold 30 seconds   Standing on One Leg Tries to lift leg/unable to hold 3 seconds but remains standing independently   Total Score 32     Timed Up and Go Test   TUG Normal TUG   Normal TUG (seconds) 44.71                     [facility] Adult PT Treatment/Exercise - 11/05/16 1018       Knee/Hip Exercises: Aerobic   Nustep lvl 6 x 6'     Knee/Hip Exercises: Seated   Abduction/Adduction  Left;15 reps   Abd/Adduction Limitations green TB   Sit to Sand 5 reps;without UE support                  PT Short Term Goals - 10/23/16 1125      PT SHORT TERM GOAL #1   Title Independent with initial HEP as indicated by 10/04/16   Status Achieved     PT SHORT TERM GOAL #2   Title Ambulate with [facility] demonstrating appropriate sequencing & step pattern with cues <50% of time by 10/11/16   Status Achieved           PT Long Term Goals - 11/05/16 1042      PT LONG TERM GOAL #1   Title Independent with advanced HEP as indicated by 12/20/16   Status Revised     PT LONG TERM GOAL #2   Title Increase Berg to >/= 30/56 to improve stability with standing activities and decrease risk for falls by 11/08/16   Status Achieved     PT LONG TERM GOAL #3   Title Ambulate with [facility] demonstrating appropriate sequencing & step pattern with cues <10% of time by 12/20/16   Status Revised     PT LONG TERM GOAL #4   Title Gait speed >/= 1.3 ft/sec to allow for limited community ambulation by 12/20/16   Status Revised               Plan - 11/05/16 1109    Clinical Impression Statement Pt has demonstrated good progress with PT with improving balance along with improving gait pattern and gait speed, however only Berg goal met at this point due to continued adjustment to new AFO. Pt continues to have difficulty donning AFO without assistance and abnormal pressure from AF0 noted in lateral edges of upper calf cuff, medial malleolus and lateral head of 5th MT. Pt encouraged to schedule a follow-up appt with the orthotist and phone message left for orthotist detailing areas of concerns as well as pt's issues w/ donning AFO. Given ongoing issues with new AFO and good potential for continued improvement with gait stability and gait speed, recommend recert for additional 2x/wk for up to 6  weeks.   Rehab Potential Good   Clinical Impairments Affecting Rehab Potential Arthritis, HTN, h/o R shoulder AC joint pain   PT Frequency 2x / week   PT Duration 6 weeks  up to 6 weeks   PT Treatment/Interventions Patient/family education;Neuromuscular re-education;Balance training;Therapeutic exercise;Therapeutic activities;Functional mobility training;Gait training;ADLs/Self  Care Home Management   PT Next Visit Plan L LE strengthening; Balance training; Donning/doffing strategies for AFO; Gait training with new AFO   Consulted and Agree with Plan of Care Patient;Family member/caregiver   Family Member Consulted son-in-law      Patient will benefit from skilled therapeutic intervention in order to improve the following deficits and impairments:  Decreased strength, Decreased range of motion, Decreased balance, Decreased coordination, Impaired tone, Difficulty walking, Abnormal gait, Decreased activity tolerance  Visit Diagnosis: Hemiplegia and hemiparesis following cerebral infarction affecting left non-dominant side (HCC)  Other abnormalities of gait and mobility  Difficulty in walking, not elsewhere classified     Problem List Patient Active Problem List   Diagnosis Date Noted  . Chest pain 09/14/2014  . HYPERTENSION 05/06/2007  . CEREBROVASCULAR ACCIDENT, HX OF 05/06/2007    [name], PT, [name] 11/05/2016, 1:46 PM  [facility] [place], [place], [address] Phone: [phone]   Fax:  [phone]  Name: Danny Lara MRN: [id number] Date of Birth: 07-13-1941

## 2016-11-07 ENCOUNTER — Ambulatory Visit: Payer: Medicare Other | Admitting: Physical Therapy

## 2016-11-12 ENCOUNTER — Ambulatory Visit: Payer: Medicare Other | Admitting: Physical Therapy

## 2016-11-14 ENCOUNTER — Ambulatory Visit: Payer: Medicare Other | Admitting: Physical Therapy

## 2016-11-14 DIAGNOSIS — R262 Difficulty in walking, not elsewhere classified: Secondary | ICD-10-CM | POA: Diagnosis not present

## 2016-11-14 DIAGNOSIS — I69354 Hemiplegia and hemiparesis following cerebral infarction affecting left non-dominant side: Secondary | ICD-10-CM

## 2016-11-14 DIAGNOSIS — R2689 Other abnormalities of gait and mobility: Secondary | ICD-10-CM | POA: Diagnosis not present

## 2016-11-14 NOTE — Therapy (Signed)
[facility] [address]  [place], [place], [address] Phone: [phone]   Fax:  [phone]  Physical Therapy Treatment  Patient Details  Name: Danny Lara MRN: [id number] Date of Birth: 08-23-1941 Referring Provider: Jonathon Jordan, MD  Encounter Date: 11/14/2016      PT End of Session - 11/14/16 1018    Visit Number 15   Number of Visits 26   Date for PT Re-Evaluation 12/20/16   Authorization Type Medicare/Medicaid   PT Start Time 1018   PT Stop Time 1100   PT Time Calculation (min) 42 min   Activity Tolerance Patient tolerated treatment well   Behavior During Therapy [facility] for tasks assessed/performed      Past Medical History:  Diagnosis Date  . Hypertension, essential   . Stroke     Past Surgical History:  Procedure Laterality Date  . APPENDECTOMY    . boil removal Right 1997    behind ear  . EYE SURGERY Right 07-18-14    There were no vitals filed for this visit.      Subjective Assessment - 11/14/16 1026    Subjective Pt reports unable to get an appt with the orthotist for AFO adjustment until Jan 5th.   Patient Stated Goals "To be 50% better."   Currently in Pain? No/denies                   [facility] Adult PT Treatment/Exercise - 11/14/16 1018      Ambulation/Gait   Ambulation/Gait Assistance 4: Min guard;5: Supervision   Ambulation Distance (Feet) 200 Feet  total distance   Assistive device Small based quad cane  L AFO   Gait Pattern Step-to pattern;Trunk rotated posteriorly on left;Decreased step length - left;Decreased step length - right;Decreased stride length;Lateral trunk lean to right;Abducted - left  L LE ER   Ambulation Surface Level;Indoor   Gait Comments Worked on increasing step length with heel-toe progression on L while minimizing trunk rotation and keeping hips parallel with L LE in neutral rotation when advancing L LE, with repeated reminders  for correct sequencing of NBQC. Mirror used for visual feedback for neutral pelvic alignment.     Knee/Hip Exercises: Aerobic   Nustep lvl 6 x 6'     Knee/Hip Exercises: Seated   Marching Left;15 reps   Marching Limitations red TB   Hamstring Curl Left;15 reps   Hamstring Limitations red TB; cues for slow pace and good control   Abduction/Adduction  Left;15 reps   Abd/Adduction Limitations green TB                  PT Short Term Goals - 10/23/16 1125      PT SHORT TERM GOAL #1   Title Independent with initial HEP as indicated by 10/04/16   Status Achieved     PT SHORT TERM GOAL #2   Title Ambulate with [facility] demonstrating appropriate sequencing & step pattern with cues <50% of time by 10/11/16   Status Achieved           PT Long Term Goals - 11/05/16 1042      PT LONG TERM GOAL #1   Title Independent with advanced HEP as indicated by 12/20/16   Status Revised     PT LONG TERM GOAL #2   Title Increase Berg to >/= 30/56 to improve stability with standing activities and decrease risk for falls by 11/08/16   Status Achieved  PT LONG TERM GOAL #3   Title Ambulate with [facility] demonstrating appropriate sequencing & step pattern with cues <10% of time by 12/20/16   Status Revised     PT LONG TERM GOAL #4   Title Gait speed >/= 1.3 ft/sec to allow for limited community ambulation by 12/20/16   Status Revised               Plan - 11/14/16 1100    Clinical Impression Statement Pt unable to schedule appt with orthotist for AFO modification until 11/29/16. Continued training in donning AFO with varying methods attempted to increase pt independence, with pt able to complete task w/o assistance while cues provided by PT but question ability to don AFO on his own using these strategies. Remainder of visit primarily focused on gait training with cues to maintain neutral pelvic and LE alignment while increasing step length and promoting heel-toe weight shift progression  to reduce heel pivot in weight bearing.    Rehab Potential Good   Clinical Impairments Affecting Rehab Potential Arthritis, HTN, h/o R shoulder AC joint pain   PT Frequency 2x / week   PT Duration 6 weeks  up to 6 weeks   PT Treatment/Interventions Patient/family education;Neuromuscular re-education;Balance training;Therapeutic exercise;Therapeutic activities;Functional mobility training;Gait training;ADLs/Self Care Home Management   PT Next Visit Plan L LE strengthening; Balance training; Donning/doffing strategies for AFO; Gait training with new AFO   Consulted and Agree with Plan of Care Patient   Family Member Consulted son-in-law      Patient will benefit from skilled therapeutic intervention in order to improve the following deficits and impairments:  Decreased strength, Decreased range of motion, Decreased balance, Decreased coordination, Impaired tone, Difficulty walking, Abnormal gait, Decreased activity tolerance  Visit Diagnosis: Hemiplegia and hemiparesis following cerebral infarction affecting left non-dominant side (HCC)  Other abnormalities of gait and mobility  Difficulty in walking, not elsewhere classified     Problem List Patient Active Problem List   Diagnosis Date Noted  . Chest pain 09/14/2014  . HYPERTENSION 05/06/2007  . CEREBROVASCULAR ACCIDENT, HX OF 05/06/2007    [name], PT, [name] 11/14/2016, 1:22 PM  [facility] [address]  [address] [place], [place], [address] Phone: [phone]   Fax:  [phone]  Name: Danny Lara MRN: [id number] Date of Birth: 1940/12/10

## 2016-11-15 ENCOUNTER — Ambulatory Visit: Payer: Medicare Other | Admitting: Physical Therapy

## 2016-11-15 DIAGNOSIS — R262 Difficulty in walking, not elsewhere classified: Secondary | ICD-10-CM | POA: Diagnosis not present

## 2016-11-15 DIAGNOSIS — R2689 Other abnormalities of gait and mobility: Secondary | ICD-10-CM | POA: Diagnosis not present

## 2016-11-15 DIAGNOSIS — I69354 Hemiplegia and hemiparesis following cerebral infarction affecting left non-dominant side: Secondary | ICD-10-CM

## 2016-11-15 NOTE — Therapy (Signed)
[facility] [address]  [place], [place], [address] Phone: [phone]   Fax:  [phone]  Physical Therapy Treatment  Patient Details  Name: Danny Lara MRN: [id number] Date of Birth: [birth year] Referring Provider: Jonathon Jordan, MD  Encounter Date: 11/15/2016      PT End of Session - 11/15/16 1020    Visit Number 16   Number of Visits 26   Date for PT Re-Evaluation 12/20/16   Authorization Type Medicare/Medicaid   PT Start Time 1020  Pt arrived late   PT Stop Time 1100   PT Time Calculation (min) 40 min   Activity Tolerance Patient tolerated treatment well   Behavior During Therapy [facility] for tasks assessed/performed      Past Medical History:  Diagnosis Date  . Hypertension, essential   . Stroke     Past Surgical History:  Procedure Laterality Date  . APPENDECTOMY    . boil removal Right 1997    behind ear  . EYE SURGERY Right 07-18-14    There were no vitals filed for this visit.      Subjective Assessment - 11/15/16 1030    Subjective Pt reports he was able to get the AFO on by himself this morning but when using the Nustep for warm-up, he c/o toe pain and toes were found to be curled up under foot.   Patient Stated Goals "To be 50% better."   Currently in Pain? No/denies                  [facility] Adult PT Treatment/Exercise - 11/15/16 1020      Ambulation/Gait   Ambulation/Gait Assistance 4: Min guard;5: Supervision   Ambulation Distance (Feet) 300 Feet  total distance   Assistive device Small based quad cane  L AFO   Gait Pattern Step-to pattern;Trunk rotated posteriorly on left;Decreased step length - left;Decreased step length - right;Decreased stride length;Lateral trunk lean to right;Abducted - left  L LE ER   Ambulation Surface Level;Indoor   Gait Comments ~200 ft with 2# cuff weight added to L LE to reduce spasticity and increase control - pt able to  take larger step with better rotational control while weight in place     Knee/Hip Exercises: Aerobic   Nustep lvl 6 x 7'     Knee/Hip Exercises: Seated   Marching Left;20 reps   Marching Limitations red TB   Hamstring Curl Left;20 reps   Hamstring Limitations red TB; cues for slow pace and good control   Abduction/Adduction  Left;20 reps   Abd/Adduction Limitations green TB             Balance Exercises - 11/15/16 1053      Balance Exercises: Standing   Sidestepping Upper extremity support;4 reps;Cognitive challenge  + L hip/knee flexion reach to tap balance pebbles             PT Short Term Goals - 10/23/16 1125      PT SHORT TERM GOAL #1   Title Independent with initial HEP as indicated by 10/04/16   Status Achieved     PT SHORT TERM GOAL #2   Title Ambulate with [facility] demonstrating appropriate sequencing & step pattern with cues <50% of time by 10/11/16   Status Achieved           PT Long Term Goals - 11/05/16 1042      PT LONG TERM GOAL #1   Title Independent with  advanced HEP as indicated by 12/20/16   Status Revised     PT LONG TERM GOAL #2   Title Increase Berg to >/= 30/56 to improve stability with standing activities and decrease risk for falls by 11/08/16   Status Achieved     PT LONG TERM GOAL #3   Title Ambulate with [facility] demonstrating appropriate sequencing & step pattern with cues <10% of time by 12/20/16   Status Revised     PT LONG TERM GOAL #4   Title Gait speed >/= 1.3 ft/sec to allow for limited community ambulation by 12/20/16   Status Revised               Plan - 11/15/16 1032    Clinical Impression Statement Pt reporting able to don AFO independently this am but closer inspection revealed toes were curled under forefoot and required correction by PT. Continued focus on L hip and knee strengthening to promoted improved foot clearnce and step length during gait. Trial of gait with 2# ankle cuff weight on L revealed pt better  able to control advancement and placement of L foot while ambulating.   Rehab Potential Good   Clinical Impairments Affecting Rehab Potential Arthritis, HTN, h/o R shoulder AC joint pain   PT Duration --   PT Treatment/Interventions Patient/family education;Neuromuscular re-education;Balance training;Therapeutic exercise;Therapeutic activities;Functional mobility training;Gait training;ADLs/Self Care Home Management   PT Next Visit Plan L LE strengthening; Balance training; Donning/doffing strategies for AFO; Gait training with new AFO   Consulted and Agree with Plan of Care Patient      Patient will benefit from skilled therapeutic intervention in order to improve the following deficits and impairments:  Decreased strength, Decreased range of motion, Decreased balance, Decreased coordination, Impaired tone, Difficulty walking, Abnormal gait, Decreased activity tolerance  Visit Diagnosis: Hemiplegia and hemiparesis following cerebral infarction affecting left non-dominant side (HCC)  Other abnormalities of gait and mobility  Difficulty in walking, not elsewhere classified     Problem List Patient Active Problem List   Diagnosis Date Noted  . Chest pain 09/14/2014  . HYPERTENSION 05/06/2007  . CEREBROVASCULAR ACCIDENT, HX OF 05/06/2007    [name], PT, [name] 11/15/2016, 12:31 PM  [facility] [address]  [place], [place], [address] Phone: [phone]   Fax:  [phone]  Name: Danny Lara MRN: [id number] Date of Birth: May 31, 1941

## 2016-11-19 ENCOUNTER — Encounter: Payer: Self-pay | Admitting: Physical Therapy

## 2016-11-21 ENCOUNTER — Ambulatory Visit: Payer: Medicare Other | Admitting: Physical Therapy

## 2016-11-21 DIAGNOSIS — I69354 Hemiplegia and hemiparesis following cerebral infarction affecting left non-dominant side: Secondary | ICD-10-CM

## 2016-11-21 DIAGNOSIS — R2689 Other abnormalities of gait and mobility: Secondary | ICD-10-CM | POA: Diagnosis not present

## 2016-11-21 DIAGNOSIS — R262 Difficulty in walking, not elsewhere classified: Secondary | ICD-10-CM

## 2016-11-21 NOTE — Therapy (Signed)
[facility] [address]  [place], [place], [address] Phone: [phone]   Fax:  [phone]  Physical Therapy Treatment  Patient Details  Name: Danny Lara MRN: [id number] Date of Birth: 19-Oct-1941 Referring Provider: Jonathon Jordan, MD  Encounter Date: 11/21/2016      PT End of Session - 11/21/16 1019    Visit Number 17   Number of Visits 26   Date for PT Re-Evaluation 12/20/16   Authorization Type Medicare/Medicaid   PT Start Time 1019  Pt arrived late   PT Stop Time 1103   PT Time Calculation (min) 44 min   Activity Tolerance Patient tolerated treatment well   Behavior During Therapy [facility] for tasks assessed/performed      Past Medical History:  Diagnosis Date  . Hypertension, essential   . Stroke     Past Surgical History:  Procedure Laterality Date  . APPENDECTOMY    . boil removal Right 1997    behind ear  . EYE SURGERY Right 07-18-14    There were no vitals filed for this visit.      Subjective Assessment - 11/21/16 1026    Subjective Pt continues to report issues with ability to don AFO independently and presents to PT in old AFO.   Patient Stated Goals "To be 50% better."   Currently in Pain? No/denies                         [facility] Adult PT Treatment/Exercise - 11/21/16 1018      Ambulation/Gait   Ambulation/Gait Assistance 4: Min guard;5: Supervision   Ambulation/Gait Assistance Details Verbal, visual (mirror) & tactile cues for upright posture, increased weight shift to L, along with forward weight shift from heel to toes upon weight acceptance on L, and increased step length while avoid posterior trunk rotation on L. Mirror used with fwd/back step and weight shift on L using counter for UE support.   Ambulation Distance (Feet) 300 Feet  total distance   Assistive device Small based quad cane  L AFO   Gait Pattern Step-to pattern;Trunk rotated  posteriorly on left;Decreased step length - left;Decreased step length - right;Decreased stride length;Lateral trunk lean to right;Abducted - left  L LE ER   Ambulation Surface Level;Indoor   Gait Comments 2# cuff weight added to L LE for part of gait training to reduce spasticity and increase control - pt able to take larger step with better rotational control while weight in place     Self-Care   Self-Care ADL's   ADL's Reviewed strategies for donning AFO with reminders to monitor toe position to avoid folding toes under foot in shoe.     Knee/Hip Exercises: Aerobic   Nustep lvl 6 x 7'                  PT Short Term Goals - 10/23/16 1125      PT SHORT TERM GOAL #1   Title Independent with initial HEP as indicated by 10/04/16   Status Achieved     PT SHORT TERM GOAL #2   Title Ambulate with [facility] demonstrating appropriate sequencing & step pattern with cues <50% of time by 10/11/16   Status Achieved           PT Long Term Goals - 11/05/16 1042      PT LONG TERM GOAL #1   Title Independent with advanced HEP as indicated by  12/20/16   Status Revised     PT LONG TERM GOAL #2   Title Increase Berg to >/= 30/56 to improve stability with standing activities and decrease risk for falls by 11/08/16   Status Achieved     PT LONG TERM GOAL #3   Title Ambulate with [facility] demonstrating appropriate sequencing & step pattern with cues <10% of time by 12/20/16   Status Revised     PT LONG TERM GOAL #4   Title Gait speed >/= 1.3 ft/sec to allow for limited community ambulation by 12/20/16   Status Revised               Plan - 11/21/16 1028    Clinical Impression Statement Continued focus on posture, weight shift and increased step length while controlling trunk and LE rotation using verbal, visual (mirror) and tactile cues. Pt able to better control step pattern when pace of gait slowed, but reverts to L posterior trunk rotation and L LE ER when attempts to ambulate.at  faster pace.   Rehab Potential Good   Clinical Impairments Affecting Rehab Potential Arthritis, HTN, h/o R shoulder AC joint pain   PT Treatment/Interventions Patient/family education;Neuromuscular re-education;Balance training;Therapeutic exercise;Therapeutic activities;Functional mobility training;Gait training;ADLs/Self Care Home Management   PT Next Visit Plan L LE strengthening; Balance training; Donning/doffing strategies for AFO; Gait training with new AFO   Consulted and Agree with Plan of Care Patient      Patient will benefit from skilled therapeutic intervention in order to improve the following deficits and impairments:  Decreased strength, Decreased range of motion, Decreased balance, Decreased coordination, Impaired tone, Difficulty walking, Abnormal gait, Decreased activity tolerance  Visit Diagnosis: Hemiplegia and hemiparesis following cerebral infarction affecting left non-dominant side (HCC)  Other abnormalities of gait and mobility  Difficulty in walking, not elsewhere classified     Problem List Patient Active Problem List   Diagnosis Date Noted  . Chest pain 09/14/2014  . HYPERTENSION 05/06/2007  . CEREBROVASCULAR ACCIDENT, HX OF 05/06/2007    [name], PT, [name] 11/21/2016, 2:11 PM  [facility] [address]  [place], [place], [address] Phone: [phone]   Fax:  [phone]  Name: Danny Lara MRN: [id number] Date of Birth: November 25, 1941

## 2016-11-26 ENCOUNTER — Ambulatory Visit: Payer: Medicare Other | Admitting: Physical Therapy

## 2016-11-28 ENCOUNTER — Encounter: Payer: Self-pay | Admitting: Physical Therapy

## 2016-11-29 ENCOUNTER — Ambulatory Visit: Payer: Medicare Other | Attending: Family Medicine | Admitting: Physical Therapy

## 2016-11-29 DIAGNOSIS — R2689 Other abnormalities of gait and mobility: Secondary | ICD-10-CM

## 2016-11-29 DIAGNOSIS — I69354 Hemiplegia and hemiparesis following cerebral infarction affecting left non-dominant side: Secondary | ICD-10-CM

## 2016-11-29 DIAGNOSIS — R262 Difficulty in walking, not elsewhere classified: Secondary | ICD-10-CM | POA: Diagnosis not present

## 2016-11-29 NOTE — Therapy (Signed)
[facility] [address]  [place], [place], [address] Phone: [phone]   Fax:  [phone]  Physical Therapy Treatment  Patient Details  Name: Danny Lara MRN: [id number] Date of Birth: [birth year] Referring Provider: Jonathon Jordan, MD  Encounter Date: 11/29/2016      PT End of Session - 11/29/16 1018    Visit Number 18   Number of Visits 26   Date for PT Re-Evaluation 12/20/16   Authorization Type Medicare/Medicaid   PT Start Time 1018  pt in BR   PT Stop Time 1102   PT Time Calculation (min) 44 min   Activity Tolerance Patient tolerated treatment well   Behavior During Therapy [facility] for tasks assessed/performed      Past Medical History:  Diagnosis Date  . Hypertension, essential   . Stroke     Past Surgical History:  Procedure Laterality Date  . APPENDECTOMY    . boil removal Right 1997    behind ear  . EYE SURGERY Right 07-18-14    There were no vitals filed for this visit.      Subjective Assessment - 11/29/16 1022    Subjective Pt clarifiying that orthotics appt is actually Jan 8 rather than today.   Patient Stated Goals "To be 50% better."   Currently in Pain? No/denies                         [facility] Adult PT Treatment/Exercise - 11/29/16 1018      Ambulation/Gait   Ambulation/Gait Assistance 4: Min guard;5: Supervision   Ambulation/Gait Assistance Details Verbal, visual & tactile cues for upright posture, increased weight shift to L, along with forward weight shift from heel to toes upon weight acceptance on L, and increased step length while avoid posterior trunk rotation on L.    Ambulation Distance (Feet) 300 Feet  total distance   Assistive device Small based quad cane  L AFO   Gait Pattern Step-to pattern;Trunk rotated posteriorly on left;Decreased step length - left;Decreased step length - right;Decreased stride length;Lateral trunk lean to  right;Abducted - left  L LE ER   Ambulation Surface Level;Unlevel     Knee/Hip Exercises: Aerobic   Nustep lvl 6 x 6'     Knee/Hip Exercises: Standing   Terminal Knee Extension Left;15 reps;2 sets;Theraband   Theraband Level (Terminal Knee Extension) Level 2 (Red)   Terminal Knee Extension Limitations 2nd set with ADD emphasis     Knee/Hip Exercises: Seated   Other Seated Knee/Hip Exercises Fitter leg press (2 blue) x20 - focus on neutral LE alignment avoiding ER at hip   Marching Left;20 reps   Marching Limitations yellow TB - ADD/IR directional emphasis   Abduction/Adduction  Left;20 reps   Abd/Adduction Limitations red TB - ADD/IR             Balance Exercises - 11/29/16 1018      Balance Exercises: Standing   Sidestepping Upper extremity support;4 reps;Cognitive challenge  L foot tap to peebles at vaying heights             PT Short Term Goals - 10/23/16 1125      PT SHORT TERM GOAL #1   Title Independent with initial HEP as indicated by 10/04/16   Status Achieved     PT SHORT TERM GOAL #2   Title Ambulate with [facility] demonstrating appropriate sequencing & step pattern with cues <50% of time by  10/11/16   Status Achieved           PT Long Term Goals - 11/29/16 1025      PT LONG TERM GOAL #1   Title Independent with advanced HEP as indicated by 12/20/16   Status On-going     PT LONG TERM GOAL #2   Title Increase Berg to >/= 30/56 to improve stability with standing activities and decrease risk for falls by 11/08/16   Status Achieved     PT LONG TERM GOAL #3   Title Ambulate with [facility] demonstrating appropriate sequencing & step pattern with cues <10% of time by 12/20/16   Status On-going     PT LONG TERM GOAL #4   Title Gait speed >/= 1.3 ft/sec to allow for limited community ambulation by 12/20/16   Status On-going               Plan - 11/29/16 1026    Clinical Impression Statement Pt arrived to PT reporting able to don his AFO this  morning with only minor assistance from his dtr. Pt continues to require frequent cues to slow pace and concentrate on proper gait pattern, emphasizing increased step length on L with increased weight shift to L to promote heel-toe progression and prevent L LE from pivoting into ER during R foot advancement.   Rehab Potential Good   Clinical Impairments Affecting Rehab Potential Arthritis, HTN, h/o R shoulder AC joint pain   PT Treatment/Interventions Patient/family education;Neuromuscular re-education;Balance training;Therapeutic exercise;Therapeutic activities;Functional mobility training;Gait training;ADLs/Self Care Home Management   PT Next Visit Plan L LE strengthening; Balance training; Donning/doffing strategies for AFO; Gait training with new AFO   Consulted and Agree with Plan of Care Patient      Patient will benefit from skilled therapeutic intervention in order to improve the following deficits and impairments:  Decreased strength, Decreased range of motion, Decreased balance, Decreased coordination, Impaired tone, Difficulty walking, Abnormal gait, Decreased activity tolerance  Visit Diagnosis: Hemiplegia and hemiparesis following cerebral infarction affecting left non-dominant side (HCC)  Other abnormalities of gait and mobility  Difficulty in walking, not elsewhere classified     Problem List Patient Active Problem List   Diagnosis Date Noted  . Chest pain 09/14/2014  . HYPERTENSION 05/06/2007  . CEREBROVASCULAR ACCIDENT, HX OF 05/06/2007    [name], PT, [name] 11/29/2016, 12:45 PM  [facility] [address]  [place], [place], [address] Phone: [phone]   Fax:  [phone]  Name: Danny Lara MRN: [id number] Date of Birth: [birth year]

## 2016-12-04 ENCOUNTER — Ambulatory Visit: Payer: Medicare Other | Admitting: Physical Therapy

## 2016-12-06 ENCOUNTER — Ambulatory Visit: Payer: Medicare Other | Admitting: Physical Therapy

## 2016-12-06 DIAGNOSIS — B9689 Other specified bacterial agents as the cause of diseases classified elsewhere: Secondary | ICD-10-CM | POA: Diagnosis not present

## 2016-12-06 DIAGNOSIS — J069 Acute upper respiratory infection, unspecified: Secondary | ICD-10-CM | POA: Diagnosis not present

## 2016-12-06 DIAGNOSIS — R0602 Shortness of breath: Secondary | ICD-10-CM | POA: Diagnosis not present

## 2016-12-10 ENCOUNTER — Ambulatory Visit: Payer: Medicare Other | Admitting: Physical Therapy

## 2016-12-11 ENCOUNTER — Ambulatory Visit: Payer: Medicare Other | Admitting: Physical Therapy

## 2016-12-12 ENCOUNTER — Ambulatory Visit: Payer: Medicare Other | Admitting: Physical Therapy

## 2016-12-13 ENCOUNTER — Ambulatory Visit: Payer: Medicare Other | Admitting: Physical Therapy

## 2016-12-17 ENCOUNTER — Ambulatory Visit: Payer: Medicare Other | Admitting: Physical Therapy

## 2016-12-17 DIAGNOSIS — R262 Difficulty in walking, not elsewhere classified: Secondary | ICD-10-CM | POA: Diagnosis not present

## 2016-12-17 DIAGNOSIS — R2689 Other abnormalities of gait and mobility: Secondary | ICD-10-CM

## 2016-12-17 DIAGNOSIS — I69354 Hemiplegia and hemiparesis following cerebral infarction affecting left non-dominant side: Secondary | ICD-10-CM

## 2016-12-17 NOTE — Therapy (Signed)
[facility] [address]  [place], [place], [address] Phone: [phone]   Fax:  [phone]  Physical Therapy Treatment  Patient Details  Name: Danny Lara MRN: [id number] Date of Birth: [birth year] Referring Provider: Jonathon Jordan, MD  Encounter Date: 12/17/2016      PT End of Session - 12/17/16 1016    Visit Number 19   Number of Visits 26   Date for PT Re-Evaluation 12/20/16   Authorization Type Medicare/Medicaid   PT Start Time 1016   PT Stop Time 1100   PT Time Calculation (min) 44 min   Activity Tolerance Patient tolerated treatment well   Behavior During Therapy [facility] for tasks assessed/performed      Past Medical History:  Diagnosis Date  . Hypertension, essential   . Stroke     Past Surgical History:  Procedure Laterality Date  . APPENDECTOMY    . boil removal Right 1997    behind ear  . EYE SURGERY Right 07-18-14    There were no vitals filed for this visit.      Subjective Assessment - 12/17/16 1024    Subjective Pt reports he saw the orthotist who widened the upper calf cuff and narrowed the lower calf/posterior ankle support as well as added extensions to the straps on his shoes. Pt reports it continues to take 30 minutes for him to don the brace/shoe on his own. Pt has been unable to return to PT until today due to illness and inclement weather.   Patient Stated Goals "To be 50% better."   Currently in Pain? No/denies                         [facility] Adult PT Treatment/Exercise - 12/17/16 1016      Ambulation/Gait   Ambulation/Gait Assistance 5: Supervision   Ambulation/Gait Assistance Details Verbal, visual & tactile cues for upright posture, neutral trunk rotation, increased weight shift to L, along with forward weight shift from heel to toes upon weight acceptance on L, and increased step length while avoid posterior trunk rotation on L.    Ambulation  Distance (Feet) 350 Feet  total distance   Assistive device Small based quad cane  L AFO   Gait Pattern Step-to pattern;Trunk rotated posteriorly on left;Decreased step length - left;Decreased step length - right;Decreased stride length;Lateral trunk lean to right;Abducted - left  L LE ER   Ambulation Surface Level;Indoor   Gait velocity 0.70 ft/sec  46.63"     Self-Care   Self-Care ADL's   ADL's Reviewed strategies for donning AFO with reminders to monitor toe position to avoid folding toes under foot in shoe. Pt able to doff and don AFO/shoe in <5 minutes on first attempt & 2:36 minutes on 2nd attempt.     Knee/Hip Exercises: Aerobic   Nustep lvl 6 x 6'                  PT Short Term Goals - 10/23/16 1125      PT SHORT TERM GOAL #1   Title Independent with initial HEP as indicated by 10/04/16   Status Achieved     PT SHORT TERM GOAL #2   Title Ambulate with [facility] demonstrating appropriate sequencing & step pattern with cues <50% of time by 10/11/16   Status Achieved           PT Long Term Goals - 11/29/16 1025  PT LONG TERM GOAL #1   Title Independent with advanced HEP as indicated by 12/20/16   Status On-going     PT LONG TERM GOAL #2   Title Increase Berg to >/= 30/56 to improve stability with standing activities and decrease risk for falls by 11/08/16   Status Achieved     PT LONG TERM GOAL #3   Title Ambulate with [facility] demonstrating appropriate sequencing & step pattern with cues <10% of time by 12/20/16   Status On-going     PT LONG TERM GOAL #4   Title Gait speed >/= 1.3 ft/sec to allow for limited community ambulation by 12/20/16   Status On-going               Plan - 12/17/16 1029    Clinical Impression Statement Pt returning to PT after >2 week absence due to illness and inclement weather. During absence from PT, pt was able to see orthotist with minor adjustments made to brace and shoe but pt continuing to report extensive time and  effort necessary to don AFO. Reviewed strategies during therapy with pt able to don AFO in under 5 minutes on 1st attempt and in 2:36 minutes on 2nd attempt. Suggested pt try sitting in chair vs on edge of bed at home to see if firmer surface allowed for better control with donning AFO. Also reviewed proper gait pattern reinforcing neutral trunk rotation, increased L hip and knee flexion during swing phase with increased weight shift to L foot during R swing phase to reduce L foot pivot on heel. By end of session, pt able to more consistently demostrate proper gait pattern. Pt's POC is set to expire at the end of the week, therefore will plan to assess goals and determine readiness for discharge at next visit.   Rehab Potential Good   Clinical Impairments Affecting Rehab Potential Arthritis, HTN, h/o R shoulder AC joint pain   PT Treatment/Interventions Patient/family education;Neuromuscular re-education;Balance training;Therapeutic exercise;Therapeutic activities;Functional mobility training;Gait training;ADLs/Self Care Home Management   PT Next Visit Plan Goal assessment to determine readiness for D/C vs need for recert   Consulted and Agree with Plan of Care Patient      Patient will benefit from skilled therapeutic intervention in order to improve the following deficits and impairments:  Decreased strength, Decreased range of motion, Decreased balance, Decreased coordination, Impaired tone, Difficulty walking, Abnormal gait, Decreased activity tolerance  Visit Diagnosis: Hemiplegia and hemiparesis following cerebral infarction affecting left non-dominant side (HCC)  Other abnormalities of gait and mobility  Difficulty in walking, not elsewhere classified     Problem List Patient Active Problem List   Diagnosis Date Noted  . Chest pain 09/14/2014  . HYPERTENSION 05/06/2007  . CEREBROVASCULAR ACCIDENT, HX OF 05/06/2007    [name], PT, [name] 12/17/2016, 1:43 PM  [facility] [address]  [place], [place], [address] Phone: [phone]   Fax:  [phone]  Name: Danny Lara MRN: [id number] Date of Birth: 11-13-1941

## 2016-12-19 ENCOUNTER — Ambulatory Visit: Payer: Medicare Other | Admitting: Physical Therapy

## 2016-12-19 DIAGNOSIS — R262 Difficulty in walking, not elsewhere classified: Secondary | ICD-10-CM

## 2016-12-19 DIAGNOSIS — I69354 Hemiplegia and hemiparesis following cerebral infarction affecting left non-dominant side: Secondary | ICD-10-CM

## 2016-12-19 DIAGNOSIS — R2689 Other abnormalities of gait and mobility: Secondary | ICD-10-CM

## 2016-12-19 NOTE — Therapy (Addendum)
[facility] [address]  [place], [place], [address] Phone: [phone]   Fax:  [phone]  Physical Therapy Treatment  Patient Details  Name: Danny Lara MRN: [id number] Date of Birth: 10-21-1941 Referring Provider: Jonathon Jordan, MD  Encounter Date: 12/19/2016      PT End of Session - 12/19/16 1019    Visit Number 20   Number of Visits 26   Date for PT Re-Evaluation 12/20/16   Authorization Type Medicare/Medicaid   PT Start Time 1019  Pt completing FOTO   PT Stop Time 1108   PT Time Calculation (min) 49 min   Activity Tolerance Patient tolerated treatment well   Behavior During Therapy [facility] for tasks assessed/performed      Past Medical History:  Diagnosis Date  . Hypertension, essential   . Stroke     Past Surgical History:  Procedure Laterality Date  . APPENDECTOMY    . boil removal Right 1997    behind ear  . EYE SURGERY Right 07-18-14    There were no vitals filed for this visit.      Subjective Assessment - 12/19/16 1024    Subjective Pt reports he was able to carryover donning technique for AFO at home with similar times to what he was able to perform during the last PT visit.   Patient Stated Goals "To be 50% better."   Currently in Pain? No/denies            [facility] PT Assessment - 12/19/16 1019      Assessment   Medical Diagnosis CVA - L hemiparesis   Referring Provider Jonathon Jordan, MD   Onset Date/Surgical Date 03/29/98   Hand Dominance Right   Next MD Visit 2018     Observation/Other Assessments   Focus on Therapeutic Outcomes (FOTO)  Neuromuscular disorder - 53% (47% limitation);     Strength   Right Hip Flexion 4+/5   Right Hip Extension 4+/5   Right Hip ABduction 5/5   Right Hip ADduction 5/5   Left Hip Flexion 4/5   Left Hip Extension 4-/5   Left Hip ABduction 4/5   Left Hip ADduction 4/5   Right Knee Flexion 5/5   Right Knee Extension 5/5    Left Knee Flexion 2+/5   Left Knee Extension 4/5   Right Ankle Dorsiflexion 4/5   Right Ankle Plantar Flexion 4/5   Right Ankle Inversion 4/5   Right Ankle Eversion 4/5   Left Ankle Dorsiflexion 2/5   Left Ankle Plantar Flexion 3/5   Left Ankle Inversion 0/5   Left Ankle Eversion 0/5     Ambulation/Gait   Ambulation/Gait Assistance 5: Supervision   Ambulation/Gait Assistance Details Reviewed cues for normalized gait pattern including reinforcement of upright posture, neutral trunk rotation, increased weight shift to L, along with forward weight shift from heel to toes upon weight acceptance on L, and increased step length while avoid posterior trunk rotation on L.    Ambulation Distance (Feet) 150 Feet   Assistive device Small based quad cane   Gait Pattern Step-to pattern;Decreased stance time - left;Decreased weight shift to left;Decreased hip/knee flexion - left   Ambulation Surface Level;Indoor   Gait velocity 1.00 ft/sec  32.65"   Gait Comments one episode of L toe catch when pt attempting to walk at increased speed during 10 MWT causing slight LOB but pt able to self-correct     Standardized Balance Assessment   Standardized Balance Assessment  Berg Balance Test;Timed Up and Go Test;10 meter walk test   10 Meter Walk 32.65" - 1.00 ft/sec     Berg Balance Test   Sit to Stand Able to stand without using hands and stabilize independently   Standing Unsupported Able to stand safely 2 minutes   Sitting with Back Unsupported but Feet Supported on Floor or Stool Able to sit safely and securely 2 minutes   Stand to Sit Sits safely with minimal use of hands   Transfers Able to transfer safely, definite need of hands   Standing Unsupported with Eyes Closed Able to stand 10 seconds with supervision   Standing Ubsupported with Feet Together Able to place feet together independently and stand for 1 minute with supervision   From Standing, Reach Forward with Outstretched Arm Can reach  forward >5 cm safely (2")   From Standing Position, Pick up Object from [place] to pick up shoe, needs supervision   From Standing Position, Turn to Look Behind Over each Shoulder Needs supervision when turning   Turn 360 Degrees Needs close supervision or verbal cueing   Standing Unsupported, Alternately Place Feet on Step/Stool Needs assistance to keep from falling or unable to try   Standing Unsupported, One Foot in Front Able to plae foot ahead of the other independently and hold 30 seconds   Standing on One Leg Tries to lift leg/unable to hold 3 seconds but remains standing independently   Total Score 36     Timed Up and Go Test   TUG Normal TUG   Normal TUG (seconds) 36.91                     [facility] Adult PT Treatment/Exercise - 12/19/16 1019      Knee/Hip Exercises: Aerobic   Nustep lvl 6 x 6'                  PT Short Term Goals - 10/23/16 1125      PT SHORT TERM GOAL #1   Title Independent with initial HEP as indicated by 10/04/16   Status Achieved     PT SHORT TERM GOAL #2   Title Ambulate with [facility] demonstrating appropriate sequencing & step pattern with cues <50% of time by 10/11/16   Status Achieved           PT Long Term Goals - 12/19/16 1027      PT LONG TERM GOAL #1   Title Independent with advanced HEP as indicated by 12/20/16   Status Achieved     PT LONG TERM GOAL #2   Title Increase Berg to >/= 30/56 to improve stability with standing activities and decrease risk for falls by 11/08/16   Status Achieved     PT LONG TERM GOAL #3   Title Ambulate with [facility] demonstrating appropriate sequencing & step pattern with cues <10% of time by 12/20/16   Status Achieved     PT LONG TERM GOAL #4   Title Gait speed >/= 1.3 ft/sec to allow for limited community ambulation by 12/20/16   Status Not Met  Pt still adjusting to modified gait pattern with new AFO with gait speed improved to 1.00 ft/sec               Plan - 12/19/16  1027    Clinical Impression Statement Pt reporting good carryover of training for donning/doffing AFO at home, with pt reporting able to complete task in time similar to performance during  last therapy session. Pt has demonstrated good progress during course of PT, with improvements noted in L LE strength and balance along with improved gait pattern and gait speed. Balance per Merrilee Jansky Balance scale improved from 19/56 to 36/56, and gait speed improved from 0.74 ft/sec to 1.00 ft/sec. All goals met except gait speed. Pt aware of need to continue with HEP and carryover with normalized gait pattern upon completion of therapy with expected continued improvement in gait speed as pt adjusts to new AFO. Will place pt on hold for 30 days in the event that issues arise with AFO or HEP, and if pt needs to return, he will require a recert.   Rehab Potential Good   Clinical Impairments Affecting Rehab Potential Arthritis, HTN, h/o R shoulder AC joint pain   PT Treatment/Interventions Patient/family education;Neuromuscular re-education;Balance training;Therapeutic exercise;Therapeutic activities;Functional mobility training;Gait training;ADLs/Self Care Home Management   PT Next Visit Plan 30 day hold, recert if needs to return   Consulted and Agree with Plan of Care Patient      Patient will benefit from skilled therapeutic intervention in order to improve the following deficits and impairments:  Decreased strength, Decreased range of motion, Decreased balance, Decreased coordination, Impaired tone, Difficulty walking, Abnormal gait, Decreased activity tolerance  Visit Diagnosis: Hemiplegia and hemiparesis following cerebral infarction affecting left non-dominant side (HCC)  Other abnormalities of gait and mobility  Difficulty in walking, not elsewhere classified       G-Codes - 01-14-17 1157    Functional Assessment Tool Used Merrilee Jansky = 36/56 (35.7% Impaired) + Clinical judgement   Functional Limitation  Mobility: Walking and moving around   Mobility: Walking and Moving Around Goal Status [phone]) At least 40 percent but less than 60 percent impaired, limited or restricted   Mobility: Walking and Moving Around Discharge Status [phone]) At least 20 percent but less than 40 percent impaired, limited or restricted      Problem List Patient Active Problem List   Diagnosis Date Noted  . Chest pain 09/14/2014  . HYPERTENSION 05/06/2007  . CEREBROVASCULAR ACCIDENT, HX OF 05/06/2007    [name], PT, [name] 01-14-2017, 11:59 AM  [facility] [address]  [place], [place], [address] Phone: [phone]   Fax:  [phone]  Name: Danny Lara MRN: [id number] Date of Birth: 1941-04-27  PHYSICAL THERAPY DISCHARGE SUMMARY  Visits from Start of Care: 20  Current functional level related to goals / functional outcomes:   Refer to above clinical impression. Pt has not needed to return, therefore will proceed with discharge from PT.   Remaining deficits:   As above.   Education / Equipment:   HEP  Plan: Patient agrees to discharge.  Patient goals were partially met. Patient is being discharged due to being pleased with the current functional level.  ?????    [name], PT, [name] 01/17/17, 8:38 AM  [facility] [place], [place], [address] Phone: [phone]   Fax:  [phone]

## 2016-12-24 ENCOUNTER — Ambulatory Visit: Payer: Medicare Other | Admitting: Physical Therapy

## 2016-12-26 ENCOUNTER — Ambulatory Visit: Payer: Medicare Other | Admitting: Physical Therapy

## 2017-03-18 DIAGNOSIS — R2681 Unsteadiness on feet: Secondary | ICD-10-CM | POA: Diagnosis not present

## 2017-03-18 DIAGNOSIS — I639 Cerebral infarction, unspecified: Secondary | ICD-10-CM | POA: Diagnosis not present

## 2017-03-18 DIAGNOSIS — I69359 Hemiplegia and hemiparesis following cerebral infarction affecting unspecified side: Secondary | ICD-10-CM | POA: Diagnosis not present

## 2017-04-11 DIAGNOSIS — M25541 Pain in joints of right hand: Secondary | ICD-10-CM | POA: Diagnosis not present

## 2017-04-11 DIAGNOSIS — I69359 Hemiplegia and hemiparesis following cerebral infarction affecting unspecified side: Secondary | ICD-10-CM | POA: Diagnosis not present

## 2017-04-11 DIAGNOSIS — L309 Dermatitis, unspecified: Secondary | ICD-10-CM | POA: Diagnosis not present

## 2017-04-17 DIAGNOSIS — M25561 Pain in right knee: Secondary | ICD-10-CM | POA: Diagnosis not present

## 2017-05-14 DIAGNOSIS — M1711 Unilateral primary osteoarthritis, right knee: Secondary | ICD-10-CM | POA: Diagnosis not present

## 2017-05-14 DIAGNOSIS — M6281 Muscle weakness (generalized): Secondary | ICD-10-CM | POA: Diagnosis not present

## 2017-06-20 DIAGNOSIS — I69351 Hemiplegia and hemiparesis following cerebral infarction affecting right dominant side: Secondary | ICD-10-CM | POA: Diagnosis not present

## 2017-06-20 DIAGNOSIS — R29898 Other symptoms and signs involving the musculoskeletal system: Secondary | ICD-10-CM | POA: Diagnosis not present

## 2017-06-20 DIAGNOSIS — I69352 Hemiplegia and hemiparesis following cerebral infarction affecting left dominant side: Secondary | ICD-10-CM | POA: Diagnosis not present

## 2017-06-20 DIAGNOSIS — G8114 Spastic hemiplegia affecting left nondominant side: Secondary | ICD-10-CM | POA: Diagnosis not present

## 2017-07-02 DIAGNOSIS — I69352 Hemiplegia and hemiparesis following cerebral infarction affecting left dominant side: Secondary | ICD-10-CM | POA: Diagnosis not present

## 2017-07-08 DIAGNOSIS — Z6824 Body mass index (BMI) 24.0-24.9, adult: Secondary | ICD-10-CM | POA: Diagnosis not present

## 2017-07-08 DIAGNOSIS — R5383 Other fatigue: Secondary | ICD-10-CM | POA: Diagnosis not present

## 2017-07-08 DIAGNOSIS — M2550 Pain in unspecified joint: Secondary | ICD-10-CM | POA: Diagnosis not present

## 2017-07-08 DIAGNOSIS — M7989 Other specified soft tissue disorders: Secondary | ICD-10-CM | POA: Diagnosis not present

## 2017-07-09 ENCOUNTER — Other Ambulatory Visit: Payer: Self-pay | Admitting: Internal Medicine

## 2017-07-09 DIAGNOSIS — M7989 Other specified soft tissue disorders: Secondary | ICD-10-CM

## 2017-07-12 ENCOUNTER — Other Ambulatory Visit (HOSPITAL_BASED_OUTPATIENT_CLINIC_OR_DEPARTMENT_OTHER): Payer: Self-pay

## 2017-07-15 ENCOUNTER — Ambulatory Visit (HOSPITAL_BASED_OUTPATIENT_CLINIC_OR_DEPARTMENT_OTHER)
Admission: RE | Admit: 2017-07-15 | Discharge: 2017-07-15 | Disposition: A | Discharge status: Home / Self Care | Payer: Medicare Other | Source: Ambulatory Visit | Attending: Internal Medicine | Admitting: Internal Medicine

## 2017-07-15 ENCOUNTER — Encounter (HOSPITAL_BASED_OUTPATIENT_CLINIC_OR_DEPARTMENT_OTHER): Payer: Self-pay

## 2017-07-15 DIAGNOSIS — M7989 Other specified soft tissue disorders: Secondary | ICD-10-CM

## 2017-07-24 DIAGNOSIS — Z6824 Body mass index (BMI) 24.0-24.9, adult: Secondary | ICD-10-CM | POA: Diagnosis not present

## 2017-07-24 DIAGNOSIS — M2550 Pain in unspecified joint: Secondary | ICD-10-CM | POA: Diagnosis not present

## 2017-07-24 DIAGNOSIS — M150 Primary generalized (osteo)arthritis: Secondary | ICD-10-CM | POA: Diagnosis not present

## 2017-07-30 DIAGNOSIS — I69352 Hemiplegia and hemiparesis following cerebral infarction affecting left dominant side: Secondary | ICD-10-CM | POA: Diagnosis not present

## 2017-08-13 ENCOUNTER — Ambulatory Visit: Payer: Medicare Other | Admitting: Physical Therapy

## 2017-08-14 DIAGNOSIS — I69359 Hemiplegia and hemiparesis following cerebral infarction affecting unspecified side: Secondary | ICD-10-CM | POA: Diagnosis not present

## 2017-08-14 DIAGNOSIS — M2550 Pain in unspecified joint: Secondary | ICD-10-CM | POA: Diagnosis not present

## 2017-08-14 DIAGNOSIS — M7541 Impingement syndrome of right shoulder: Secondary | ICD-10-CM | POA: Diagnosis not present

## 2017-08-14 DIAGNOSIS — G894 Chronic pain syndrome: Secondary | ICD-10-CM | POA: Diagnosis not present

## 2017-08-14 DIAGNOSIS — Z23 Encounter for immunization: Secondary | ICD-10-CM | POA: Diagnosis not present

## 2017-08-14 DIAGNOSIS — R269 Unspecified abnormalities of gait and mobility: Secondary | ICD-10-CM | POA: Diagnosis not present

## 2017-08-27 ENCOUNTER — Ambulatory Visit: Payer: Medicare Other | Attending: Orthopedic Surgery | Admitting: Physical Therapy

## 2017-08-27 DIAGNOSIS — M25511 Pain in right shoulder: Secondary | ICD-10-CM | POA: Insufficient documentation

## 2017-08-27 DIAGNOSIS — M25561 Pain in right knee: Secondary | ICD-10-CM | POA: Diagnosis not present

## 2017-08-27 DIAGNOSIS — M62838 Other muscle spasm: Secondary | ICD-10-CM | POA: Diagnosis not present

## 2017-08-27 DIAGNOSIS — R293 Abnormal posture: Secondary | ICD-10-CM | POA: Diagnosis not present

## 2017-08-27 DIAGNOSIS — R29898 Other symptoms and signs involving the musculoskeletal system: Secondary | ICD-10-CM | POA: Insufficient documentation

## 2017-08-27 DIAGNOSIS — R262 Difficulty in walking, not elsewhere classified: Secondary | ICD-10-CM | POA: Diagnosis not present

## 2017-08-27 DIAGNOSIS — R2689 Other abnormalities of gait and mobility: Secondary | ICD-10-CM | POA: Insufficient documentation

## 2017-08-27 DIAGNOSIS — M6281 Muscle weakness (generalized): Secondary | ICD-10-CM | POA: Diagnosis not present

## 2017-08-27 NOTE — Therapy (Signed)
Danny Lara [address]  [place], [place], [address] Phone: [phone]   Fax:  [phone]  Physical Therapy Evaluation  Patient Details  Name: Danny Lara MRN: [id number] Date of Birth: [birth year] Referring Provider: Rod Can, MD  Encounter Date: 08/27/2017      PT End of Session - 08/27/17 1157    Visit Number 1   Number of Visits 12   Date for PT Re-Evaluation 10/10/17   Authorization Type Medicare   PT Start Time 1102   PT Stop Time 1157   PT Time Calculation (min) 55 min   Activity Tolerance Patient tolerated treatment well   Behavior During Therapy [facility] for tasks assessed/performed      Past Medical History:  Diagnosis Date  . Hypertension, essential   . Stroke     Past Surgical History:  Procedure Laterality Date  . APPENDECTOMY    . boil removal Right 1997    behind ear  . EYE SURGERY Right 07-18-14    There were no vitals filed for this visit.       Subjective Assessment - 08/27/17 1108    Subjective In May 2018, pt reports he was attempting to stand up from transport chair while holding onto back of diagonal pew at [employer] when his knee twisted - noted medial pain immediately and some swelling by later that day. Saw MD ~1 month later and states x-rays were negative with MD telling him it was mostly likely a sprained knee. MD recommended cortisone injection which pt decline, but was placed in a patellar knee brace. Pt noting some benefit from brace in relation to pain.   Patient Stated Goals "tighten the muscles"   Currently in Pain? No/denies   Pain Score 0-No pain  up to 6/10   Pain Location Knee   Pain Orientation Right   Pain Descriptors / Indicators Aching  "weak kneed"   Pain Type Acute pain   Pain Onset More than a month ago   Pain Frequency Intermittent   Aggravating Factors  sit to stand transfers; walking   Pain Relieving Factors ibuprofen   Effect of  Pain on Daily Activities makes transfers more difficult; limits walking tolerance            [facility] PT Assessment - 08/27/17 1102      Assessment   Medical Diagnosis R knee pain   Referring Provider Rod Can, MD   Onset Date/Surgical Date --  May 2018   Next MD Visit uncertain   Prior Therapy PT 08/2016 - 11/2016 fro weakness & gait difficulty related to h/o CVA     Balance Screen   Has the patient fallen in the past 6 months No   Has the patient had a decrease in activity level because of a fear of falling?  No   Is the patient reluctant to leave their home because of a fear of falling?  No     Home Environment   Living Environment Private residence   Living Arrangements Spouse/significant other;Children   Available Help at Discharge Family   Type of [place] Access Level entry   [place] - quad;Transport chair     Prior Function   Level of [place] with household mobility with device;Needs assistance with ADLs   Vocation Retired   Leisure mostly sedentary     Observation/Other Assessments   Focus on Therapeutic Outcomes (  FOTO)  knee 28% (72% limitation); predicted 49% (51% limitation)     ROM / Strength   AROM / PROM / Strength AROM;Strength     AROM   Overall AROM  Within functional limits for tasks performed     Strength   Overall Strength Comments L sided weakness for prior CVA in 1999 - wears L AFO    Strength Assessment Site Hip;Knee;Ankle   Right/Left Hip Right;Left   Right Hip Flexion 4-/5   Right Hip Extension 3-/5   Right Hip External Rotation  4-/5  tested in sitting   Right Hip Internal Rotation 4-/5   Right Hip ABduction 3-/5   Left Hip Flexion 3+/5   Left Hip Extension 4-/5   Right/Left Knee Right;Left   Right Knee Flexion 4-/5   Right Knee Extension 4/5   Left Knee Flexion 2/5   Left Knee Extension 4-/5   Right/Left Ankle Right;Left   Right Ankle Dorsiflexion 4-/5   Right  Ankle Inversion 4-/5   Right Ankle Eversion 4-/5   Left Ankle Dorsiflexion 0/5   Left Ankle Plantar Flexion 0/5   Left Ankle Inversion 0/5   Left Ankle Eversion 0/5     Flexibility   Soft Tissue Assessment /Muscle Length yes   Hamstrings mod/severe tightness B   Quadriceps mild/mod tightness R   ITB mod/severe tightness on R   Piriformis WFL - R     Palpation   Patella mobility WNL   Palpation comment significantly increased muscle tension R lateral quads, HS & ITB; minimal to no VMO activation on R quad set            Objective measurements completed on examination: See above findings.                  PT Education - 08/27/17 1156    Education provided Yes   Education Details Pt eval findings & anticipated POC; need for separate referral for R shoulder pain   Person(s) Educated Patient;Child(ren)   Methods Explanation   Comprehension Verbalized understanding          PT Short Term Goals - 08/27/17 1200      PT SHORT TERM GOAL #1   Title Independent with initial HEP    Status New   Target Date 09/17/17           PT Long Term Goals - 08/27/17 1200      PT LONG TERM GOAL #1   Title Independent with onoing HEP as indicated    Status New   Target Date 10/10/17     PT LONG TERM GOAL #2   Title Overall R hip and knee strength >/= 4/5 with improved VMO activation   Status New   Target Date 10/10/17     PT LONG TERM GOAL #3   Title Pt will complete sit <> stand transfers w/o increased R knee pain   Status New   Target Date 10/10/17     PT LONG TERM GOAL #4   Title Ambulate with SBQC demonstrating appropriate sequencing & step pattern w/o limitaiton due to R knee pain   Status New   Target Date 10/10/17                Plan - 08/27/17 1200    Clinical Impression Statement Danny Lara is a 76 y/o male who presents to OP PT for a moderate complexity eval for R medial knee pain following a twisting injury to knee during sit to stand  transfer while rising from transport chair ~4-5 months ago. 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 Pt demonstrated good benefit from prior PT with increased independence with transfers and gait with all goals met except gait speed for prior episode. Pt reports he continues to use his NBQC for household ambulation, but reports very limited community ambulation. Pt reporting R medial knee pain has improved since MD gave him a brace for his knee, but still experiences pain while transferring sit to stand and to a lesser degree when walking. Pt demonstrates moderate to severe tightness and significantly increased muscle tension in R hamstrings, lateral quads and ITB, with minimal VMO activation on quad contraction. R hip and knee strength significantly decreased as compared to testing at completion of last episode (currently 3-/5 to 4-/5 vs 4+/5 to 5/5 previously). Pain and weakness resulting in increased need for UE support/assist during transfers and gait which is likely contributing to pt's concomitant c/o R shoulder pain. Benjimin demonstrates good potential to benefit from skilled PT to address R LE flexibility/muscle tension and strength deficits to allow for increased ease of mobility for improved activity and gait tolerance and return to normal daily activities w/o limitation due to pain. Pt also expressing concerns regarding R shoulder pain and wishing to address this with PT as well - daughter reports upcoming appt scheduled with Dr. Veverly Fells, therefore will await MD referral as indicated for R shoulder pain.   History and Personal Factors relevant to plan of care: CVA with L hemiparesis - ambulates with NBQC & L AFO, flaccid L UE; R shoulder pain   Clinical Presentation Evolving   Clinical Presentation due to: knee pain improving since initial  injury, but contributing to increased R shoulder pain resulting from increased use of R UE to assist with transfers and gait d/t L hemiparesis from prior CVA   Clinical Decision Making Moderate   Rehab Potential Good   Clinical Impairments Affecting Rehab Potential CVA with L hemiparesis - ambulates with NBQC & L AFO, flaccid L UE    PT Frequency 2x / week   PT Duration 6 weeks   PT Treatment/Interventions Patient/family education;ADLs/Self Care Home Management;Therapeutic exercise;Neuromuscular re-education;Manual techniques;Dry needling;Taping;Electrical Stimulation;Cryotherapy;Vasopneumatic Device;Iontophoresis 34m/ml Dexamethasone;Therapeutic activities;Functional mobility training;Gait training;Passive range of motion   Consulted and Agree with Plan of Care Patient;Family member/caregiver   Family Member Consulted daughter & son-in-law      Patient will benefit from skilled therapeutic intervention in order to improve the following deficits and impairments:  Pain, Impaired flexibility, Increased fascial restricitons, Increased muscle spasms, Impaired tone, Decreased strength, Difficulty walking, Decreased balance, Abnormal gait  Visit Diagnosis: Acute pain of right knee  Muscle weakness (generalized)  Other abnormalities of gait and mobility  Difficulty in walking, not elsewhere classified      G-Codes - 111/01/20181131    Functional Assessment Tool Used (Outpatient Only) knee FOTO = 28% (72% limitation)   Functional Limitation Mobility: Walking and moving around   Mobility: Walking and Moving Around Current Status (([id number] At least 60 percent but less than 80 percent impaired, limited or restricted   Mobility: Walking and Moving Around Goal Status [phone] At least 40 percent but less than 60 percent impaired, limited or restricted       Problem List Patient Active Problem List   Diagnosis Date Noted  .  Chest pain 09/14/2014  . HYPERTENSION 05/06/2007  . CEREBROVASCULAR  ACCIDENT, HX OF 05/06/2007    [name], PT, [name] 08/27/2017, 1:46 PM  [facility] [address]  [place], [place], [address] Phone: [phone]   Fax:  [phone]  Name: Jaquavis Felmlee MRN: [id number] Date of Birth: December 11, 1940

## 2017-09-03 ENCOUNTER — Ambulatory Visit: Payer: Medicare Other | Admitting: Physical Therapy

## 2017-09-03 DIAGNOSIS — R293 Abnormal posture: Secondary | ICD-10-CM | POA: Diagnosis not present

## 2017-09-03 DIAGNOSIS — R2689 Other abnormalities of gait and mobility: Secondary | ICD-10-CM

## 2017-09-03 DIAGNOSIS — M25511 Pain in right shoulder: Secondary | ICD-10-CM | POA: Diagnosis not present

## 2017-09-03 DIAGNOSIS — M6281 Muscle weakness (generalized): Secondary | ICD-10-CM | POA: Diagnosis not present

## 2017-09-03 DIAGNOSIS — R262 Difficulty in walking, not elsewhere classified: Secondary | ICD-10-CM | POA: Diagnosis not present

## 2017-09-03 DIAGNOSIS — M25561 Pain in right knee: Secondary | ICD-10-CM | POA: Diagnosis not present

## 2017-09-03 DIAGNOSIS — M7541 Impingement syndrome of right shoulder: Secondary | ICD-10-CM | POA: Diagnosis not present

## 2017-09-03 NOTE — Therapy (Signed)
[facility] [address]  [place], [place], [address] Phone: [phone]   Fax:  [phone]  Physical Therapy Treatment  Patient Details  Name: Danny Lara MRN: [id number] Date of Birth: 07-17-1941 Referring Provider: Rod Can, MD  Encounter Date: 09/03/2017      PT End of Session - 09/03/17 1101    Visit Number 2   Number of Visits 12   Date for PT Re-Evaluation 10/10/17   Authorization Type Medicare   PT Start Time 1101   PT Stop Time 1149   PT Time Calculation (min) 48 min   Activity Tolerance Patient tolerated treatment well   Behavior During Therapy [facility] for tasks assessed/performed      Past Medical History:  Diagnosis Date  . Hypertension, essential   . Stroke     Past Surgical History:  Procedure Laterality Date  . APPENDECTOMY    . boil removal Right 1997    behind ear  . EYE SURGERY Right 07-18-14    There were no vitals filed for this visit.      Subjective Assessment - 09/03/17 1106    Subjective Pt denies any knee pain today, but still experiencing shoulder pain and states he feels like he pulled a muscle in his back. Scheduled to see the MD about his shoulder this afternoon. During therapy session, pt pulled PT aside and stated in hushed tones that he had fallen in the bathroom on Sat, but did not want to report it to his doctor. He denies pain other than bruising, which he has been able to manage with Tylenol and Salon Pas patch.   Patient Stated Goals "tighten the muscles"   Currently in Pain? No/denies   Pain Score 0-No pain   Pain Onset --                         [facility] Adult PT Treatment/Exercise - 09/03/17 1101      Exercises   Exercises Knee/Hip     Knee/Hip Exercises: Stretches   Passive Hamstring Stretch Right;30 seconds;2 reps   Passive Hamstring Stretch Limitations supine with strap; partial assistance from PT   ITB Stretch  Right;30 seconds;2 reps   ITB Stretch Limitations supine with strap; partial assistance from PT     Knee/Hip Exercises: Aerobic   Nustep lvl 4 x 6'     Knee/Hip Exercises: Supine   Short Arc Quad Sets Right;10 reps   Terminal Knee Extension Right;10 reps   Terminal Knee Extension Limitations ball under knee   Hip Adduction Isometric Both;10 reps   Hip Adduction Isometric Limitations 5" ball squeeze, 2 sets   Bridges with Clamshell Both;10 reps  + hip ABD isometric with green TB, 2 sets   Straight Leg Raises Right;10 reps   Straight Leg Raise with External Rotation Right;10 reps   Other Supine Knee/Hip Exercises hooklying alt hip ABD/ER with green TB x10                  PT Short Term Goals - 09/03/17 1108      PT SHORT TERM GOAL #1   Title Independent with initial HEP    Status On-going           PT Long Term Goals - 09/03/17 1108      PT LONG TERM GOAL #1   Title Independent with onoing HEP as indicated    Status On-going  PT LONG TERM GOAL #2   Title Overall R hip and knee strength >/= 4/5 with improved VMO activation   Status On-going     PT LONG TERM GOAL #3   Title Pt will complete sit <> stand transfers w/o increased R knee pain   Status On-going     PT LONG TERM GOAL #4   Title Ambulate with SBQC demonstrating appropriate sequencing & step pattern w/o limitaiton due to R knee pain   Status On-going               Plan - 09/03/17 1109    Clinical Impression Statement Pt reluctantly admitting to fall at home in bathroom on Saturday, but not wanting to notify MD. Pt reports landing on L side (hemiparesis from old CVA) denies any acute injury other than mild tenderness/bruising but able to bear full weight through L LE w/o pain. Fall did not change R knee pain with pt denying any pain in knee today or recently. Initated stretching for tight R LE musculature but pt needing partial assistance from PT due to L hemiparesis. Pt able to perform all  strengthening exercises with close supervision and cueing for proper pacing and hold times. Slight R anteromedial knee pain noted with TKE with small ball, but allieviated with manual medial glide during quad contraction. Deferred issuing HEP today due to need for close supervision/assistance with most exercises. Will await f/u from MD appt this pm to determine if additional PT referral received to address shoulder/back pain.   Rehab Potential Good   Clinical Impairments Affecting Rehab Potential CVA with L hemiparesis - ambulates with NBQC & L AFO, flaccid L UE    PT Treatment/Interventions Patient/family education;ADLs/Self Care Home Management;Therapeutic exercise;Neuromuscular re-education;Manual techniques;Dry needling;Taping;Electrical Stimulation;Cryotherapy;Vasopneumatic Device;Iontophoresis 4mg /ml Dexamethasone;Therapeutic activities;Functional mobility training;Gait training;Passive range of motion   Consulted and Agree with Plan of Care Patient      Patient will benefit from skilled therapeutic intervention in order to improve the following deficits and impairments:  Pain, Impaired flexibility, Increased fascial restricitons, Increased muscle spasms, Impaired tone, Decreased strength, Difficulty walking, Decreased balance, Abnormal gait  Visit Diagnosis: Acute pain of right knee  Muscle weakness (generalized)  Other abnormalities of gait and mobility  Difficulty in walking, not elsewhere classified     Problem List Patient Active Problem List   Diagnosis Date Noted  . Chest pain 09/14/2014  . HYPERTENSION 05/06/2007  . CEREBROVASCULAR ACCIDENT, HX OF 05/06/2007    [name], PT, [name] 09/03/2017, 2:12 PM  [facility] [address]  [place], [place], [address] Phone: [phone]   Fax:  [phone]  Name: Danny Lara MRN: [id number] Date of Birth: [birth year]

## 2017-09-09 ENCOUNTER — Ambulatory Visit: Payer: Medicare Other | Admitting: Physical Therapy

## 2017-09-10 ENCOUNTER — Ambulatory Visit: Payer: Medicare Other | Admitting: Physical Therapy

## 2017-09-11 DIAGNOSIS — G894 Chronic pain syndrome: Secondary | ICD-10-CM | POA: Diagnosis not present

## 2017-09-11 DIAGNOSIS — L309 Dermatitis, unspecified: Secondary | ICD-10-CM | POA: Diagnosis not present

## 2017-09-11 DIAGNOSIS — E559 Vitamin D deficiency, unspecified: Secondary | ICD-10-CM | POA: Diagnosis not present

## 2017-09-11 DIAGNOSIS — I69359 Hemiplegia and hemiparesis following cerebral infarction affecting unspecified side: Secondary | ICD-10-CM | POA: Diagnosis not present

## 2017-09-11 DIAGNOSIS — Z79899 Other long term (current) drug therapy: Secondary | ICD-10-CM | POA: Diagnosis not present

## 2017-09-11 DIAGNOSIS — Z0000 Encounter for general adult medical examination without abnormal findings: Secondary | ICD-10-CM | POA: Diagnosis not present

## 2017-09-11 DIAGNOSIS — Z23 Encounter for immunization: Secondary | ICD-10-CM | POA: Diagnosis not present

## 2017-09-16 ENCOUNTER — Ambulatory Visit: Payer: Medicare Other | Admitting: Physical Therapy

## 2017-09-16 DIAGNOSIS — R293 Abnormal posture: Secondary | ICD-10-CM | POA: Diagnosis not present

## 2017-09-16 DIAGNOSIS — R262 Difficulty in walking, not elsewhere classified: Secondary | ICD-10-CM

## 2017-09-16 DIAGNOSIS — M25511 Pain in right shoulder: Secondary | ICD-10-CM | POA: Diagnosis not present

## 2017-09-16 DIAGNOSIS — M25561 Pain in right knee: Secondary | ICD-10-CM

## 2017-09-16 DIAGNOSIS — M6281 Muscle weakness (generalized): Secondary | ICD-10-CM | POA: Diagnosis not present

## 2017-09-16 DIAGNOSIS — R2689 Other abnormalities of gait and mobility: Secondary | ICD-10-CM

## 2017-09-16 NOTE — Therapy (Signed)
[facility] [address]  [place], [place], [address] Phone: [phone]   Fax:  [phone]  Physical Therapy Treatment  Patient Details  Name: Danny Lara MRN: [id number] Date of Birth: Aug 30, 1941 Referring Provider: Rod Can, MD  Encounter Date: 09/16/2017      PT End of Session - 09/16/17 1100    Visit Number 3   Number of Visits 12   Date for PT Re-Evaluation 10/10/17   Authorization Type Medicare   PT Start Time 1100   PT Stop Time 1152   PT Time Calculation (min) 52 min   Activity Tolerance Patient tolerated treatment well   Behavior During Therapy [facility] for tasks assessed/performed      Past Medical History:  Diagnosis Date  . Hypertension, essential   . Stroke     Past Surgical History:  Procedure Laterality Date  . APPENDECTOMY    . boil removal Right 1997    behind ear  . EYE SURGERY Right 07-18-14    There were no vitals filed for this visit.      Subjective Assessment - 09/16/17 1108    Subjective Pt reports he saw the MD for his shoulder pain and received an injection - pt states MD told him he should get relief in 2-3 weeks and did not order PT at this time.   Patient Stated Goals "tighten the muscles"   Currently in Pain? Yes   Pain Score 0-No pain   Pain Location Knee   Pain Orientation Right   Pain Score 6   Pain Location Shoulder  & lower thoracic spine   Pain Orientation Right   Pain Descriptors / Indicators Other (Comment);Throbbing  "sudden", "jolting", "lingering"   Pain Type Acute pain   Pain Onset More than a month ago  May 2018   Pain Frequency Intermittent   Aggravating Factors  when using R arm   Pain Relieving Factors Salon pas patch, ibuprofen, Voltaren gel/Biofreeze   Effect of Pain on Daily Activities makes moving difficult as pt relies heavily on R UE assist with mobility                         [facility] Adult PT  Treatment/Exercise - 09/16/17 1100      Knee/Hip Exercises: Aerobic   Nustep lvl 4 x 6'     Knee/Hip Exercises: Seated   Long Arc [employer] reps;Strengthening;2 sets  1 set each with TB & wts   Long [employer] Weight 2 lbs.  + hip adduction ball squeeze   Long Arc Quad Limitations looped red TB at ankles - assist needed to stabilize L LE   [employer] 15x5"   Hamstring Curl Right;20 reps;Strengthening   Hamstring Limitations looped red TB at ankles with L foot propped on 9" stool - assist needed to stabilize L LE     Knee/Hip Exercises: Supine   Hip Adduction Isometric Both;15 reps   Hip Adduction Isometric Limitations 5" ball squeeze   Bridges with Clamshell Both;15 reps;Strengthening  + hip ABD isometric with green TB   Straight Leg Raises Right;15 reps;Strengthening   Straight Leg Raises Limitations 2#   Straight Leg Raise with External Rotation Right;15 reps;Strengthening   Straight Leg Raise with External Rotation Limitations 2#   Other Supine Knee/Hip Exercises hooklying alt hip ABD/ER with green TB x15  PT Education - 09/16/17 1152    Education provided Yes   Education Details Initial HEP   Person(s) Educated Patient   Methods Explanation;Demonstration;Handout   Comprehension Verbalized understanding;Returned demonstration;Need further instruction          PT Short Term Goals - 09/03/17 1108      PT SHORT TERM GOAL #1   Title Independent with initial HEP    Status On-going           PT Long Term Goals - 09/03/17 1108      PT LONG TERM GOAL #1   Title Independent with onoing HEP as indicated    Status On-going     PT LONG TERM GOAL #2   Title Overall R hip and knee strength >/= 4/5 with improved VMO activation   Status On-going     PT LONG TERM GOAL #3   Title Pt will complete sit <> stand transfers w/o increased R knee pain   Status On-going     PT LONG TERM GOAL #4   Title Ambulate with SBQC demonstrating appropriate  sequencing & step pattern w/o limitaiton due to R knee pain   Status On-going               Plan - 09/16/17 1203    Clinical Impression Statement Pt continuing to report more issues with R shoulder and mid back pain and denies R knee pain at present, however no PT referral received for either R shoulder or back - son-in-law to check with pt's dtr regarding status with MD in relation to R shoulder or back.. Today's visit focusing on creation of HEP, with exercises leaning toward cuff weight resistance rather than theraband as pt having difficulty stabilizing with L LE due to L hemiparesis. Continued cues need for pacing and keeping track of reps, with need for further HEP review anticipated.   Rehab Potential Good   Clinical Impairments Affecting Rehab Potential CVA with L hemiparesis - ambulates with NBQC & L AFO, flaccid L UE    PT Treatment/Interventions Patient/family education;ADLs/Self Care Home Management;Therapeutic exercise;Neuromuscular re-education;Manual techniques;Dry needling;Taping;Electrical Stimulation;Cryotherapy;Vasopneumatic Device;Iontophoresis 4mg /ml Dexamethasone;Therapeutic activities;Functional mobility training;Gait training;Passive range of motion   Consulted and Agree with Plan of Care Patient   Family Member Consulted son-in-law      Patient will benefit from skilled therapeutic intervention in order to improve the following deficits and impairments:  Pain, Impaired flexibility, Increased fascial restricitons, Increased muscle spasms, Impaired tone, Decreased strength, Difficulty walking, Decreased balance, Abnormal gait  Visit Diagnosis: Acute pain of right knee  Muscle weakness (generalized)  Other abnormalities of gait and mobility  Difficulty in walking, not elsewhere classified     Problem List Patient Active Problem List   Diagnosis Date Noted  . Chest pain 09/14/2014  . HYPERTENSION 05/06/2007  . CEREBROVASCULAR ACCIDENT, HX OF 05/06/2007     [name], PT, [name] 09/16/2017, 12:09 PM  [facility] [address]  [place], [place], [address] Phone: [phone]   Fax:  [phone]  Name: Danny Lara MRN: [id number] Date of Birth: 25-Aug-1941

## 2017-09-17 ENCOUNTER — Ambulatory Visit: Payer: Medicare Other | Admitting: Physical Therapy

## 2017-09-17 DIAGNOSIS — M6281 Muscle weakness (generalized): Secondary | ICD-10-CM

## 2017-09-17 DIAGNOSIS — R2689 Other abnormalities of gait and mobility: Secondary | ICD-10-CM

## 2017-09-17 DIAGNOSIS — R262 Difficulty in walking, not elsewhere classified: Secondary | ICD-10-CM | POA: Diagnosis not present

## 2017-09-17 DIAGNOSIS — M25511 Pain in right shoulder: Secondary | ICD-10-CM | POA: Diagnosis not present

## 2017-09-17 DIAGNOSIS — M25561 Pain in right knee: Secondary | ICD-10-CM | POA: Diagnosis not present

## 2017-09-17 DIAGNOSIS — R293 Abnormal posture: Secondary | ICD-10-CM | POA: Diagnosis not present

## 2017-09-17 NOTE — Therapy (Addendum)
[facility] [address]  [place], [place], [address] Phone: [phone]   Fax:  [phone]  Physical Therapy Treatment  Patient Details  Name: Danny Lara MRN: [id number] Date of Birth: [birth year] Referring Provider: Rod Can, MD  Encounter Date: 09/17/2017      PT End of Session - 09/17/17 1108    Visit Number 4   Number of Visits 12   Date for PT Re-Evaluation 10/10/17   Authorization Type Medicare   PT Start Time 1108   PT Stop Time 1150   PT Time Calculation (min) 42 min   Activity Tolerance Patient tolerated treatment well   Behavior During Therapy [facility] for tasks assessed/performed      Past Medical History:  Diagnosis Date  . Hypertension, essential   . Stroke     Past Surgical History:  Procedure Laterality Date  . APPENDECTOMY    . boil removal Right 1997    behind ear  . EYE SURGERY Right 07-18-14    There were no vitals filed for this visit.      Subjective Assessment - 09/17/17 1115    Subjective Pt's dtr reports PT will need to request orders for shoulder/upper back - pt seeing Dr. Jonn Shingles for these problems.   Patient Stated Goals "tighten the muscles"   Currently in Pain? Yes   Pain Score 0-No pain   Pain Location Knee   Pain Score 6   Pain Location Shoulder   & mid/lower thoracic spine   Pain Orientation Right   Pain Descriptors / Indicators Throbbing   Pain Type Acute pain   Pain Onset --  May 2018                         [facility] Adult PT Treatment/Exercise - 09/17/17 1108      Ambulation/Gait   Ambulation Distance (Feet) 100 Feet   Assistive device Small based quad cane   Gait Pattern Lateral trunk lean to right;Trunk rotated posteriorly on left;Decreased weight shift to left;Decreased hip/knee flexion - left;Poor foot clearance - left;Decreased step length - right;Decreased step length - left;Step-to pattern   Ambulation  Surface Level;Indoor   Gait Comments Verbal, tactile & visual (mirror feedback) cues to center weight over BOS with increased weight shift over L LE and decreased lean into SBQC. Cues for increased hip/knee flexion to promote improved foot clearance and increased step length.     Exercises   Exercises Knee/Hip     Knee/Hip Exercises: Aerobic   Nustep lvl 5 x 6'     Knee/Hip Exercises: Standing   Hip Flexion Right;Left;10 reps;Knee bent   Hip Flexion Limitations UE support on back of chair   Functional Squat 15 reps;3 seconds   Functional Squat Limitations UE support on back of chair; PT guarding L knee     Knee/Hip Exercises: Seated   Long Arc Quad Right;20 reps;Strengthening  1 set each with TB & wts   Long [employer] Weight 2 lbs.  + hip adduction ball squeeze   Marching Left;10 reps   Marching Limitations yellow TB over lower thigh                PT Education - 09/16/17 1152    Education provided Yes   Education Details Initial HEP   Person(s) Educated Patient   Methods Explanation;Demonstration;Handout   Comprehension Verbalized understanding;Returned demonstration;Need further instruction  PT Short Term Goals - 09/03/17 1108      PT SHORT TERM GOAL #1   Title Independent with initial HEP    Status Achieved           PT Long Term Goals - 09/03/17 1108      PT LONG TERM GOAL #1   Title Independent with onoing HEP as indicated    Status On-going     PT LONG TERM GOAL #2   Title Overall R hip and knee strength >/= 4/5 with improved VMO activation   Status On-going     PT LONG TERM GOAL #3   Title Pt will complete sit <> stand transfers w/o increased R knee pain   Status Acheived     PT LONG TERM GOAL #4   Title Ambulate with SBQC demonstrating appropriate sequencing & step pattern w/o limitaiton due to R knee pain   Status On-going               Plan - October 04, 2017 1136    Clinical Impression Statement Orders faxed today to Dr.  Hart Robinsons requesting addition of shoulder/upper back to [place]. Pt remains pain free in R knee, therefore focused on gait training to improve posture and weight shift to L for decreased strain on R knee and shoulder, while improving step length and fluidity of gait. Remaining time focusing on L LE strengthening to improve pt confidence with weight shift to L LE during gait.   Rehab Potential Good   Clinical Impairments Affecting Rehab Potential CVA with L hemiparesis - ambulates with NBQC & L AFO, flaccid L UE    PT Treatment/Interventions Patient/family education;ADLs/Self Care Home Management;Therapeutic exercise;Neuromuscular re-education;Manual techniques;Dry needling;Taping;Electrical Stimulation;Cryotherapy;Vasopneumatic Device;Iontophoresis 65m/ml Dexamethasone;Therapeutic activities;Functional mobility training;Gait training;Passive range of motion   Consulted and Agree with Plan of Care Patient   Family Member Consulted son-in-law      Patient will benefit from skilled therapeutic intervention in order to improve the following deficits and impairments:  Pain, Impaired flexibility, Increased fascial restricitons, Increased muscle spasms, Impaired tone, Decreased strength, Difficulty walking, Decreased balance, Abnormal gait  Visit Diagnosis: Acute pain of right knee  Muscle weakness (generalized)  Other abnormalities of gait and mobility  Difficulty in walking, not elsewhere classified     Problem List Patient Active Problem List   Diagnosis Date Noted  . Chest pain 09/14/2014  . HYPERTENSION 05/06/2007  . CEREBROVASCULAR ACCIDENT, HX OF 05/06/2007    JPercival Spanish PT, [name] 1November 10, 2018 1:27 PM  [facility] [place] [address]Phone: [phone]  Fax:  [phone] Name: FDiana ArmijoMRN: [id number] of Birth: 3April 13, 1942 PHYSICAL THERAPY DISCHARGE  SUMMARY  Visits from Start of Care: 4  Current functional level related to goals / functional outcomes:   Pt requesting to shift focus of PT to R shoulder and back pain rather than R knee as he feels that knee is no longer an issue, but R shoulder and back pain are currently limiting his daily activities.   Remaining deficits:   As above.   Education / Equipment:   HEP   G-Codes - 1November 10, 2018   Functional Assessment Tool Used (Outpatient Only) Clinical judgment    Functional Limitation Mobility: Walking and moving around   Mobility: Walking and Moving Around Goal (([id number] At least 40 percent but less than 60 percent impaired, limited or restricted   Mobility: Walking and Moving Around  Discharge  Status [phone]) At least 40 percent but less than 60 percent impaired, limited or restricted    Plan: Patient agrees to discharge.  Patient goals were partially met. Patient is being discharged due to the patient's request.  ?????   [name], PT, [name] 09/23/17, 1:09 PM  [facility] [address]  [place], [place], [address] Phone: [phone]   Fax:  [phone]

## 2017-09-23 ENCOUNTER — Ambulatory Visit: Payer: Medicare Other | Admitting: Physical Therapy

## 2017-09-23 DIAGNOSIS — R2689 Other abnormalities of gait and mobility: Secondary | ICD-10-CM

## 2017-09-23 DIAGNOSIS — R29898 Other symptoms and signs involving the musculoskeletal system: Secondary | ICD-10-CM

## 2017-09-23 DIAGNOSIS — R293 Abnormal posture: Secondary | ICD-10-CM | POA: Diagnosis not present

## 2017-09-23 DIAGNOSIS — M6281 Muscle weakness (generalized): Secondary | ICD-10-CM | POA: Diagnosis not present

## 2017-09-23 DIAGNOSIS — R262 Difficulty in walking, not elsewhere classified: Secondary | ICD-10-CM | POA: Diagnosis not present

## 2017-09-23 DIAGNOSIS — M62838 Other muscle spasm: Secondary | ICD-10-CM

## 2017-09-23 DIAGNOSIS — M25511 Pain in right shoulder: Secondary | ICD-10-CM

## 2017-09-23 DIAGNOSIS — M25561 Pain in right knee: Secondary | ICD-10-CM | POA: Diagnosis not present

## 2017-09-23 NOTE — Therapy (Signed)
[facility] [address]  [place], [place], [address] Phone: [phone]   Fax:  [phone]  Physical Therapy Evaluation  Patient Details  Name: Courtland Reas MRN: [id number] Date of Birth: 08/28/1941 Referring Provider: R. Hart Robinsons, MD  Encounter Date: 09/23/2017      PT End of Session - 09/23/17 1105    Visit Number 1   Number of Visits 12   Date for PT Re-Evaluation 11/07/17   Authorization Type Medicare   PT Start Time 1105   PT Stop Time 1159   PT Time Calculation (min) 54 min   Activity Tolerance Patient tolerated treatment well   Behavior During Therapy WFL for tasks assessed/performed      Past Medical History:  Diagnosis Date  . Hypertension, essential   . Stroke     Past Surgical History:  Procedure Laterality Date  . APPENDECTOMY    . boil removal Right 1997    behind ear  . EYE SURGERY Right 07-18-14    There were no vitals filed for this visit.       Subjective Assessment - 09/23/17 1121    Subjective In May 2018, pt reports he was attempting to stand up from transport chair while holding onto back of diagonal pew at [employer] when his knee twisted causing him to have to steady himself with his R UE, resulting in strain to R shoulder and mid/upper back. Pt states pain further exacerbated when attempting to perform AAROM for L hemiparetic UE.   Patient Stated Goals "make the pain go away"   Currently in Pain? Yes   Pain Score 5    Pain Location Shoulder   Pain Orientation Right;Upper;Posterior   Pain Onset More than a month ago   Pain Frequency Intermittent   Aggravating Factors  extending arm out in front or reaching overhead   Pain Relieving Factors ibuprofen, Salon pas patch, Voltaren gel/Biofreeze   Effect of Pain on Daily Activities "anything where I have to use my R arm is difficult"   Multiple Pain Sites Yes   Pain Score --  6-7/10   Pain Location Back    Pain Orientation Right;Upper;Mid;Lower   Pain Descriptors / Indicators Dull   Pain Type Acute pain   Pain Onset More than a month ago  May 2018   Pain Frequency Intermittent   Aggravating Factors  bending forward   Pain Relieving Factors ibuprofen, Salon pas patch, Voltaren gel/Biofreeze   Effect of Pain on Daily Activities difficulty with lifting or reaching with R arm, turning faucet on/off            [facility] PT Assessment - 09/23/17 1105      Assessment   Medical Diagnosis R shoulder & back pain   Referring Provider R. Hart Robinsons, MD   Onset Date/Surgical Date --  May 2018   Prior Therapy PT 08/2016 - 11/2016 fro weakness & gait difficulty related to h/o CVA; 4 PT visits earlier this month for R knee pain     Balance Screen   Has the patient fallen in the past 6 months No   Has the patient had a decrease in activity level because of a fear of falling?  No   Is the patient reluctant to leave their home because of a fear of falling?  No     Home Environment   Living Environment Private residence   Living Arrangements Spouse/significant other;Children   Available Help at Discharge  Family   Type of Home House   Home Access Level entry   Home Layout One level   [place] - quad;Transport chair     Prior Function   Level of Independence Independent with household mobility with device;Needs assistance with ADLs   Vocation Retired   Leisure mostly sedentary     Posture/Postural Control   Posture/Postural Control Postural limitations   Postural Limitations Rounded Shoulders;Forward head   Posture Comments flaccid hemiparesis on L     ROM / Strength   AROM / PROM / Strength AROM;PROM;Strength     AROM   Overall AROM  Deficits;Due to pain   Overall AROM Comments pain with R shoulder flexion & abduction above shoulder height; flaccid hemiparesis of L UE from prior CVA   AROM Assessment Site Shoulder   Right/Left Shoulder Right   Right Shoulder Flexion 125  Degrees   Right Shoulder ABduction 103 Degrees   Right Shoulder Internal Rotation --  FIR to L2   Right Shoulder External Rotation --  FER to C5     PROM   PROM Assessment Site Shoulder   Right/Left Shoulder Right   Right Shoulder Flexion 151 Degrees   Right Shoulder ABduction 144 Degrees   Right Shoulder Internal Rotation 84 Degrees   Right Shoulder External Rotation 82 Degrees     Strength   Overall Strength Comments pain with all resisted motion   Strength Assessment Site Shoulder   Right/Left Shoulder Right   Right Shoulder Flexion 3-/5   Right Shoulder ABduction 3-/5   Right Shoulder Internal Rotation 4/5   Right Shoulder External Rotation 4/5     Palpation   Palpation comment increased muscle tension & ttp over R UT, LS, rhomboids, lats, supraspinatus & infraspinatus     Ambulation/Gait   Gait Pattern Lateral trunk lean to right;Trunk rotated posteriorly on left;Decreased weight shift to left;Decreased hip/knee flexion - left;Poor foot clearance - left;Decreased step length - right;Decreased step length - left;Step-to pattern  heavy lean on quad cane            Objective measurements completed on examination: See above findings.          [facility] Adult PT Treatment/Exercise - 09/23/17 1105      Exercises   Exercises Shoulder     Shoulder Exercises: Seated   Retraction Both;10 reps  5" hold   Retraction Limitations verbal & tactile cues for scap retraction + depression, avoiding shoulder elevation/shrug   Other Seated Exercises B backward shoulder rolls x5     Shoulder Exercises: Stretch   Other Shoulder Stretches seated R UT & LS stretches with R hand anchored on edge of seat x30 sec each                PT Education - 09/23/17 1159    Education provided Yes   Education Details PT eval findings, postural awareness, & initial HEP   Person(s) Educated Patient   Methods Explanation;Demonstration;Handout;Verbal cues;Tactile cues   Comprehension  Verbalized understanding;Returned demonstration;Verbal cues required;Tactile cues required;Need further instruction          PT Short Term Goals - 09/23/17 1159      PT SHORT TERM GOAL #1   Title Independent with initial HEP    Status New   Target Date 10/10/17           PT Long Term Goals - 09/23/17 1159      PT LONG TERM GOAL #1   Title Independent with  ongoing HEP as indicated    Status New   Target Date 11/07/17     PT LONG TERM GOAL #2   Title Pt will demonstrate neutral spine and shoulder posture in sitting with cues </= 25% of time   Status New   Target Date 11/07/17     PT LONG TERM GOAL #3   Title Pt will demonstrate R shoulder ROM WFL w/o limitation due to R shoulder or back pain    Status New   Target Date 11/07/17     PT LONG TERM GOAL #4   Title R shoulder strength >/= 4/5 w/o limitation due to pain   Status New   Target Date 11/07/17     PT LONG TERM GOAL #5   Title Ambulate with [facility] demonstrating appropriate sequencing & step pattern to decrease R UE strain   Status New   Target Date 11/07/17                Plan - 09/23/17 1159    Clinical Impression Statement Knoxx is a 76 y/o male who presents to OP PT for a moderate complexity eval for R shoulder pain following a twisting injury during sit to stand transfer while rising from transport chair in May 2018, further exacerbated when attempting to perform AAROM for L hemiparetic UE. 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 Pt reports current pain primarily in R upper shoulder and scapular region extending into R mid back with pain up to 7-1/06 with certain movements and activities. Pt demonstrates significant postural abnormalities, in part due to L hemiparesis from CVA, with pt currently  demonstrating forward head and rounded shoulder posture with trunk shift to R and decreased lumbar lordosis with tendency for sacral sitting in unsupported sitting. In standing, pt demonstrates excessive weight shift to R with heavy lean on SBQC resulting in shoulder elevation in addition to above forward head and rounded shoulder positioning. R shoulder AROM limited as compared to PROM with pain noted with overhead ROM in flexion & abduction. R shoulder strength significantly limited in flexion & abduction with pain reported for all resisted motion of R shoulder. Recent R knee pain and LE hemiparesis from prior CVA also likely contributing to need for increased reliance on R UE support/assist during transfers and gait which is likely a factor in pt's R shoulder pain. Jancarlos demonstrates good potential to benefit from skilled PT to address postural awareness, R shoulder/scapular flexibility/muscle tension, ROM and strength deficits to allow for increased ease of mobility for improved activity and gait tolerance and return to normal daily activities w/o limitation due to pain.   History and Personal Factors relevant to plan of care: CVA with L hemiparesis - ambulates with NBQC & L AFO, flaccid L UE; new L calf pain   Clinical Presentation Evolving   Clinical Presentation due to: ongoing R shoulder and back pain since May 2018; medically complex with L hemiparesis from prior CVA   Clinical Decision Making Moderate   Rehab Potential Good   Clinical Impairments Affecting Rehab Potential CVA with L hemiparesis - ambulates with NBQC & L AFO, flaccid L UE    PT Frequency 2x / week   PT Duration 6 weeks   PT Treatment/Interventions Patient/family education;ADLs/Self Care Home Management;Neuromuscular re-education;Manual techniques;Dry needling;Taping;Electrical  Stimulation;Cryotherapy;Vasopneumatic Device;Iontophoresis 4mg /ml Dexamethasone;Passive range of motion;Moist Heat;Therapeutic exercise;Therapeutic  activities;Functional mobility training;Gait training   Consulted and Agree with Plan of Care Patient;Family member/caregiver   Family Member Consulted son-in-law      Patient will benefit from skilled therapeutic intervention in order to improve the following deficits and impairments:  Pain, Impaired flexibility, Increased fascial restricitons, Increased muscle spasms, Impaired tone, Decreased strength, Impaired UE functional use, Decreased mobility, Difficulty walking, Abnormal gait  Visit Diagnosis: Acute pain of right shoulder  Abnormal posture  Other muscle spasm  Other symptoms and signs involving the musculoskeletal system  Other abnormalities of gait and mobility      G-Codes - Oct 02, 2017 1158    Functional Assessment Tool Used (Outpatient Only) Clinical judgment   Functional Limitation Changing and maintaining body position   Changing and Maintaining Body Position Current Status (U2725) At least 60 percent but less than 80 percent impaired, limited or restricted   Changing and Maintaining Body Position Goal Status (D6644) At least 40 percent but less than 60 percent impaired, limited or restricted       Problem List Patient Active Problem List   Diagnosis Date Noted  . Chest pain 09/14/2014  . HYPERTENSION 05/06/2007  . CEREBROVASCULAR ACCIDENT, HX OF 05/06/2007    [name], PT, [name] 2017/10/02, 1:55 PM  [facility] [place], [place], [address] Phone: [phone]   Fax:  [phone]  Name: Aiken Withem MRN: [id number] Date of Birth: September 29, 1941

## 2017-09-25 ENCOUNTER — Ambulatory Visit: Payer: Medicare Other | Attending: Specialist | Admitting: Physical Therapy

## 2017-09-25 DIAGNOSIS — R2689 Other abnormalities of gait and mobility: Secondary | ICD-10-CM

## 2017-09-25 DIAGNOSIS — M25511 Pain in right shoulder: Secondary | ICD-10-CM

## 2017-09-25 DIAGNOSIS — R293 Abnormal posture: Secondary | ICD-10-CM

## 2017-09-25 DIAGNOSIS — M62838 Other muscle spasm: Secondary | ICD-10-CM

## 2017-09-25 DIAGNOSIS — R29898 Other symptoms and signs involving the musculoskeletal system: Secondary | ICD-10-CM | POA: Diagnosis not present

## 2017-09-25 NOTE — Therapy (Signed)
[facility] [address]  [place], [place], [address] Phone: [phone]   Fax:  [phone]  Physical Therapy Treatment  Patient Details  Name: Danny Lara MRN: [id number] Date of Birth: 1941-11-10 Referring Provider: R. Hart Robinsons, MD  Encounter Date: 09/25/2017      PT End of Session - 09/25/17 1102    Visit Number 2   Number of Visits 12   Date for PT Re-Evaluation 11/07/17   Authorization Type Medicare   PT Start Time 1102   PT Stop Time 1148   PT Time Calculation (min) 46 min   Activity Tolerance Patient tolerated treatment well   Behavior During Therapy WFL for tasks assessed/performed      Past Medical History:  Diagnosis Date  . Hypertension, essential   . Stroke     Past Surgical History:  Procedure Laterality Date  . APPENDECTOMY    . boil removal Right 1997    behind ear  . EYE SURGERY Right 07-18-14    There were no vitals filed for this visit.      Subjective Assessment - 09/25/17 1107    Subjective Pt with no new c/o today.   Patient Stated Goals "make the pain go away"   Currently in Pain? Yes   Pain Score 3    Pain Location Shoulder   Pain Orientation Right;Upper;Posterior   Pain Descriptors / Indicators Constant  "steady"   Pain Onset More than a month ago  May 2018                         [facility] Adult PT Treatment/Exercise - 09/25/17 1102      Exercises   Exercises Shoulder     Shoulder Exercises: Seated   Extension Right;15 reps;Theraband;Strengthening   Theraband Level (Shoulder Extension) Level 3 (Green)   Retraction Both;10 reps  5" hold   Retraction Limitations verbal & tactile cues for scap retraction + depression, avoiding shoulder elevation/shrug and posterior trunk lean   Row Right;15 reps;Theraband;Strengthening   Theraband Level (Shoulder Row) Level 3 (Green)   Row Limitations cues for posture & controlled motion   External Rotation Right;15 reps;Theraband;Strengthening   Theraband Level (Shoulder External Rotation) Level 2 (Red)   External Rotation Limitations neutral shoulder with towel under elbow   Internal Rotation Right;15 reps;Theraband;Strengthening   Theraband Level (Shoulder Internal Rotation) Level 2 (Red)   Internal Rotation Limitations neutral shoulder with towel under elbow   Flexion Right;AAROM;15 reps   Flexion Limitations wall slides with cues for posture & avoid rapid movement or "bounce" at top of ROM   Abduction Right;AAROM;15 reps   ABduction Limitations scaption wall slides with cues for posture & avoid rapid movement or "bounce" at top of ROM     Shoulder Exercises: Stretch   Other Shoulder Stretches seated R UT & LS stretches with R hand anchored on edge of seat x30 sec each                PT Education - 09/25/17 1147    Education provided Yes   Education Details HEP addition   Person(s) Educated Patient   Methods Explanation;Demonstration;Handout;Verbal cues;Tactile cues   Comprehension Verbalized understanding;Returned demonstration;Verbal cues required;Tactile cues required;Need further instruction          PT Short Term Goals - 09/25/17 1113      PT SHORT TERM GOAL #1   Title Independent with initial HEP  Status On-going           PT Long Term Goals - 09/25/17 1114      PT LONG TERM GOAL #1   Title Independent with ongoing HEP as indicated    Status On-going     PT LONG TERM GOAL #2   Title Pt will demonstrate neutral spine and shoulder posture in sitting with cues </= 25% of time   Status On-going     PT LONG TERM GOAL #3   Title Pt will demonstrate R shoulder ROM WFL w/o limitation due to R shoulder or back pain    Status On-going     PT LONG TERM GOAL #4   Title R shoulder strength >/= 4/5 w/o limitation due to pain   Status On-going     PT LONG TERM GOAL #5   Title Ambulate with SBQC demonstrating appropriate sequencing & step  pattern to decrease R UE strain   Status On-going               Plan - 09/25/17 1114    Clinical Impression Statement Initial HEP reviewed with pt requring cues for postural correction, positioning and hold times focusing on static hold and avoiding "bouncing" during stretches. Attempted wall ladder for R shoulder AAROM, but between pt limited postural control and arthritic fingers pt having difficulty acheiving good ROM, therefore switched to seated wall slides in flexion & abduction with pt better able to perform these. Pt also requiring close monitoring for posture and technique during theraband strengthening activities.   Rehab Potential Good   Clinical Impairments Affecting Rehab Potential CVA with L hemiparesis - ambulates with NBQC & L AFO, flaccid L UE    PT Frequency 2x / week   PT Duration 6 weeks   PT Treatment/Interventions Patient/family education;ADLs/Self Care Home Management;Neuromuscular re-education;Manual techniques;Dry needling;Taping;Electrical Stimulation;Cryotherapy;Vasopneumatic Device;Iontophoresis 4mg /ml Dexamethasone;Passive range of motion;Moist Heat;Therapeutic exercise;Therapeutic activities;Functional mobility training;Gait training   Consulted and Agree with Plan of Care Patient;Family member/caregiver   Family Member Consulted son-in-law      Patient will benefit from skilled therapeutic intervention in order to improve the following deficits and impairments:  Pain, Impaired flexibility, Increased fascial restricitons, Increased muscle spasms, Impaired tone, Decreased strength, Impaired UE functional use, Decreased mobility, Difficulty walking, Abnormal gait  Visit Diagnosis: Acute pain of right shoulder  Abnormal posture  Other muscle spasm  Other symptoms and signs involving the musculoskeletal system  Other abnormalities of gait and mobility     Problem List Patient Active Problem List   Diagnosis Date Noted  . Chest pain 09/14/2014  .  HYPERTENSION 05/06/2007  . CEREBROVASCULAR ACCIDENT, HX OF 05/06/2007    [name], PT, [name] 09/25/2017, 12:45 PM  [facility] [address]  [place], [place], [address] Phone: [phone]   Fax:  [phone]  Name: Quince Santana MRN: [id number] Date of Birth: 22-Jan-1941

## 2017-09-30 ENCOUNTER — Ambulatory Visit: Payer: Medicare Other | Admitting: Physical Therapy

## 2017-09-30 ENCOUNTER — Encounter: Payer: Self-pay | Admitting: Physical Therapy

## 2017-09-30 DIAGNOSIS — R2689 Other abnormalities of gait and mobility: Secondary | ICD-10-CM | POA: Diagnosis not present

## 2017-09-30 DIAGNOSIS — R29898 Other symptoms and signs involving the musculoskeletal system: Secondary | ICD-10-CM

## 2017-09-30 DIAGNOSIS — M62838 Other muscle spasm: Secondary | ICD-10-CM

## 2017-09-30 DIAGNOSIS — R293 Abnormal posture: Secondary | ICD-10-CM

## 2017-09-30 DIAGNOSIS — M25511 Pain in right shoulder: Secondary | ICD-10-CM | POA: Diagnosis not present

## 2017-09-30 NOTE — Therapy (Signed)
[facility] [address]  [place], [place], [address] Phone: [phone]   Fax:  [phone]  Physical Therapy Treatment  Patient Details  Name: Danny Lara MRN: [id number] Date of Birth: [birth year] Referring Provider: R. Hart Robinsons, MD   Encounter Date: 09/30/2017  PT End of Session - 09/30/17 1100    Visit Number  3    Number of Visits  12    Date for PT Re-Evaluation  11/07/17    Authorization Type  Medicare    PT Start Time  1100    PT Stop Time  1149    PT Time Calculation (min)  49 min    Activity Tolerance  Patient tolerated treatment well    Behavior During Therapy  WFL for tasks assessed/performed       Past Medical History:  Diagnosis Date  . Hypertension, essential   . Stroke [facility])     Past Surgical History:  Procedure Laterality Date  . APPENDECTOMY    . boil removal Right 1997    behind ear  . EYE SURGERY Right 07-18-14    There were no vitals filed for this visit.  Subjective Assessment - 09/30/17 1107    Subjective  Pt reports no issues with HEP exercises    Patient Stated Goals  "make the pain go away"    Currently in Pain?  Yes    Pain Score  3     Pain Location  Shoulder    Pain Orientation  Right    Pain Descriptors / Indicators  -- "vague"   "vague"   Pain Type  Acute pain    Pain Frequency  Intermittent    Multiple Pain Sites  No    Pain Onset  More than a month ago May 2018   May 2018                     [facility] Adult PT Treatment/Exercise - 09/30/17 1100      Exercises   Exercises  Shoulder      Knee/Hip Exercises: Aerobic   Nustep  lvl 5 x 6' (B LE & R UE)      Shoulder Exercises: Supine   Protraction  Right;10 reps;Weights;Strengthening    Protraction Weight (lbs)  3    Protraction Limitations  VC & TC's to avoid shoulder hiking    Other Supine Exercises  R shoulder small circles at 90 dg flexion CW/CCW 3# x10      Shoulder  Exercises: Seated   Extension  Right;15 reps;Theraband;Strengthening    Theraband Level (Shoulder Extension)  Level 3 (Green)    Extension Limitations  + scap retraction    Row  Right;15 reps;Theraband;Strengthening    Theraband Level (Shoulder Row)  Level 3 (Green)    Row Limitations  cues for posture & controlled motion    External Rotation  Right;15 reps;Theraband;Strengthening    Theraband Level (Shoulder External Rotation)  Level 2 (Red)    External Rotation Limitations  neutral shoulder with towel under elbow    Internal Rotation  Right;15 reps;Theraband;Strengthening    Theraband Level (Shoulder Internal Rotation)  Level 2 (Red)    Internal Rotation Limitations  neutral shoulder with towel under elbow    Flexion  Right;AAROM;15 reps    Flexion Limitations  wall slides with cues for posture & avoid rapid movement or "bounce" at top of ROM    Abduction  Right;AAROM;15 reps    ABduction  Limitations  scaption wall slides with cues for posture & avoid rapid movement or "bounce" at top of ROM      Shoulder Exercises: ROM/Strengthening   Rhythmic Stabilization, Supine  R shoulder at 90 dg flexion 2 x 15"      Manual Therapy   Manual Therapy  Soft tissue mobilization;Myofascial release    Soft tissue mobilization  R pecs, UT & posterior capusle    Myofascial Release  TPR R pec major & teres group               PT Short Term Goals - 09/25/17 1113      PT SHORT TERM GOAL #1   Title  Independent with initial HEP     Status  On-going        PT Long Term Goals - 09/25/17 1114      PT LONG TERM GOAL #1   Title  Independent with ongoing HEP as indicated     Status  On-going      PT LONG TERM GOAL #2   Title  Pt will demonstrate neutral spine and shoulder posture in sitting with cues </= 25% of time    Status  On-going      PT LONG TERM GOAL #3   Title  Pt will demonstrate R shoulder ROM WFL w/o limitation due to R shoulder or back pain     Status  On-going      PT  LONG TERM GOAL #4   Title  R shoulder strength >/= 4/5 w/o limitation due to pain    Status  On-going      PT LONG TERM GOAL #5   Title  Ambulate with SBQC demonstrating appropriate sequencing & step pattern to decrease R UE strain    Status  On-going            Plan - 09/30/17 1100    Clinical Impression Statement  Pt denies any issues with initial HEP and able to perform HEP exercises with only min cueing for trunk posture. Pt demonstrating better posture and scapular activation with cues during theraband scapular stabilization/strengthening and RTC strengthening exercises but difficult to determine way for pt to replicate set-up at home due to L hemiparesis. Pt continues to demonstrate heavy lean to R on [facility] with gait which is likely still contributing increased pain and muscle tension in R shoulder but pt resistant to gait training efforts to increase weight shift to L as pt states he feels more like he will fall when he does this.     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, flaccid L UE     PT Frequency  2x / week    PT Duration  6 weeks    PT Treatment/Interventions  Patient/family education;ADLs/Self Care Home Management;Neuromuscular re-education;Manual techniques;Dry needling;Taping;Electrical Stimulation;Cryotherapy;Vasopneumatic Device;Iontophoresis 4mg /ml Dexamethasone;Passive range of motion;Moist Heat;Therapeutic exercise;Therapeutic activities;Functional mobility training;Gait training    Consulted and Agree with Plan of Care  Patient       Patient will benefit from skilled therapeutic intervention in order to improve the following deficits and impairments:  Pain, Impaired flexibility, Increased fascial restricitons, Increased muscle spasms, Impaired tone, Decreased strength, Impaired UE functional use, Decreased mobility, Difficulty walking, Abnormal gait  Visit Diagnosis: Acute pain  of right shoulder  Abnormal posture  Other muscle spasm  Other symptoms and signs involving the musculoskeletal system  Other abnormalities of gait  and mobility     Problem List Patient Active Problem List   Diagnosis Date Noted  . Chest pain 09/14/2014  . HYPERTENSION 05/06/2007  . CEREBROVASCULAR ACCIDENT, HX OF 05/06/2007    [name], PT, [name] 09/30/2017, 12:03 PM  [facility] [address]  [place], [place], [address] Phone: [phone]   Fax:  [phone]  Name: Danny Lara MRN: [id number] Date of Birth: 1941-09-04

## 2017-10-02 ENCOUNTER — Ambulatory Visit: Payer: Medicare Other | Admitting: Physical Therapy

## 2017-10-02 ENCOUNTER — Encounter: Payer: Self-pay | Admitting: Physical Therapy

## 2017-10-02 DIAGNOSIS — M62838 Other muscle spasm: Secondary | ICD-10-CM | POA: Diagnosis not present

## 2017-10-02 DIAGNOSIS — R29898 Other symptoms and signs involving the musculoskeletal system: Secondary | ICD-10-CM | POA: Diagnosis not present

## 2017-10-02 DIAGNOSIS — M25511 Pain in right shoulder: Secondary | ICD-10-CM | POA: Diagnosis not present

## 2017-10-02 DIAGNOSIS — R293 Abnormal posture: Secondary | ICD-10-CM | POA: Diagnosis not present

## 2017-10-02 DIAGNOSIS — R2689 Other abnormalities of gait and mobility: Secondary | ICD-10-CM

## 2017-10-02 NOTE — Therapy (Signed)
[facility] [address]  [place], [place], [address] Phone: [phone]   Fax:  [phone]  Physical Therapy Treatment  Patient Details  Name: Philip Kotlyar MRN: [id number] Date of Birth: 1941/08/24 Referring Provider: R. Hart Robinsons, MD   Encounter Date: 10/02/2017  PT End of Session - 10/02/17 1105    Visit Number  4    Number of Visits  12    Date for PT Re-Evaluation  11/07/17    Authorization Type  Medicare    PT Start Time  1105    PT Stop Time  1149    PT Time Calculation (min)  44 min    Activity Tolerance  Patient tolerated treatment well    Behavior During Therapy  WFL for tasks assessed/performed       Past Medical History:  Diagnosis Date  . Hypertension, essential   . Stroke [facility])     Past Surgical History:  Procedure Laterality Date  . APPENDECTOMY    . boil removal Right 1997    behind ear  . EYE SURGERY Right 07-18-14    There were no vitals filed for this visit.  Subjective Assessment - 10/02/17 1109    Subjective  Pt reporting R shoulder pain is getting to the point where "I can forget it".    Patient Stated Goals  "make the pain go away"    Currently in Pain?  Yes    Pain Score  2     Pain Location  Shoulder    Pain Orientation  Right;Upper    Pain Descriptors / Indicators  -- "vanishing"   "vanishing"   Pain Type  Acute pain    Pain Onset  More than a month ago May 2018   May 2018                     [facility] Adult PT Treatment/Exercise - 10/02/17 1105      Exercises   Exercises  Shoulder      Shoulder Exercises: Supine   Protraction  Right;15 reps;Weights;Strengthening    Protraction Weight (lbs)  4    Horizontal ABduction  Right;10 reps;Theraband;Strengthening    Theraband Level (Shoulder Horizontal ABduction)  Level 2 (Red)    External Rotation  Right;10 reps;Theraband;Strengthening    Theraband Level (Shoulder External Rotation)   Level 2 (Red)    Other Supine Exercises  R shoulder small circles at 90 dg flexion CW/CCW 3# x10    Other Supine Exercises  R shoulder extension & D2 flexion with red TB x10 each      Shoulder Exercises: Sidelying   Other Sidelying Exercises  R open book stretch 10x5"    Other Sidelying Exercises  R scap retraction + horizontal ABD to vertical 5x3"      Shoulder Exercises: Stretch   Other Shoulder Stretches  seated R UT & LS stretches with R hand anchored on edge of seat x30 sec each      Manual Therapy   Manual Therapy  Soft tissue mobilization;Myofascial release;Scapular mobilization;Joint mobilization;Passive ROM    Manual therapy comments  pt supine & L sidelying    Joint Mobilization  R shoulder - grade II-III inf & A/P mobs    Soft tissue mobilization  R pecs, UT, rhomboids & posterior capusle    Myofascial Release  TPR R pec major, UT & teres group    Scapular Mobilization  R scapula - all directions  Passive ROM  R shoulder flexion + contract/relax               PT Short Term Goals - 09/25/17 1113      PT SHORT TERM GOAL #1   Title  Independent with initial HEP     Status  On-going        PT Long Term Goals - 09/25/17 1114      PT LONG TERM GOAL #1   Title  Independent with ongoing HEP as indicated     Status  On-going      PT LONG TERM GOAL #2   Title  Pt will demonstrate neutral spine and shoulder posture in sitting with cues </= 25% of time    Status  On-going      PT LONG TERM GOAL #3   Title  Pt will demonstrate R shoulder ROM WFL w/o limitation due to R shoulder or back pain     Status  On-going      PT LONG TERM GOAL #4   Title  R shoulder strength >/= 4/5 w/o limitation due to pain    Status  On-going      PT LONG TERM GOAL #5   Title  Ambulate with SBQC demonstrating appropriate sequencing & step pattern to decrease R UE strain    Status  On-going            Plan - 10/02/17 1114    Clinical Impression Statement  Pt reporting R  shoulder pain is improving to the point where he "can forget about it". Continued muscle tightness t/o R shoulder complex still limiting overhead motion but responding to manual therapy with improving ROM and decreasing pain. Pt still requring cues for proper technique with exercises avoiding "jerking" motions and maintaining slow steady pace with appropriate pauses during strengthening exercises.    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, flaccid L UE     PT Frequency  2x / week    PT Duration  6 weeks    PT Treatment/Interventions  Patient/family education;ADLs/Self Care Home Management;Neuromuscular re-education;Manual techniques;Dry needling;Taping;Electrical Stimulation;Cryotherapy;Vasopneumatic Device;Iontophoresis 4mg /ml Dexamethasone;Passive range of motion;Moist Heat;Therapeutic exercise;Therapeutic activities;Functional mobility training;Gait training    Consulted and Agree with Plan of Care  Patient       Patient will benefit from skilled therapeutic intervention in order to improve the following deficits and impairments:  Pain, Impaired flexibility, Increased fascial restricitons, Increased muscle spasms, Impaired tone, Decreased strength, Impaired UE functional use, Decreased mobility, Difficulty walking, Abnormal gait  Visit Diagnosis: Acute pain of right shoulder  Abnormal posture  Other muscle spasm  Other symptoms and signs involving the musculoskeletal system  Other abnormalities of gait and mobility     Problem List Patient Active Problem List   Diagnosis Date Noted  . Chest pain 09/14/2014  . HYPERTENSION 05/06/2007  . CEREBROVASCULAR ACCIDENT, HX OF 05/06/2007    [name], PT, [name] 10/02/2017, 12:07 PM  [facility] [address]  [place], [place], [address] Phone: [phone]   Fax:  [phone]  Name:  Grayden Burley MRN: [id number] Date of Birth: 07/02/1941

## 2017-10-07 ENCOUNTER — Ambulatory Visit: Payer: Medicare Other | Admitting: Physical Therapy

## 2017-10-07 ENCOUNTER — Encounter: Payer: Self-pay | Admitting: Physical Therapy

## 2017-10-07 DIAGNOSIS — M25511 Pain in right shoulder: Secondary | ICD-10-CM | POA: Diagnosis not present

## 2017-10-07 DIAGNOSIS — R2689 Other abnormalities of gait and mobility: Secondary | ICD-10-CM

## 2017-10-07 DIAGNOSIS — M62838 Other muscle spasm: Secondary | ICD-10-CM | POA: Diagnosis not present

## 2017-10-07 DIAGNOSIS — R293 Abnormal posture: Secondary | ICD-10-CM | POA: Diagnosis not present

## 2017-10-07 DIAGNOSIS — R29898 Other symptoms and signs involving the musculoskeletal system: Secondary | ICD-10-CM | POA: Diagnosis not present

## 2017-10-07 NOTE — Therapy (Signed)
[facility] [address]  [place], [place], [address] Phone: [phone]   Fax:  [phone]  Physical Therapy Treatment  Patient Details  Name: Danny Lara MRN: [id number] Date of Birth: 11-15-1941 Referring Provider: R. Hart Robinsons, MD   Encounter Date: 10/07/2017  PT End of Session - 10/07/17 1103    Visit Number  5    Number of Visits  12    Date for PT Re-Evaluation  11/07/17    Authorization Type  Medicare    PT Start Time  1103    PT Stop Time  1150    PT Time Calculation (min)  47 min    Activity Tolerance  Patient tolerated treatment well    Behavior During Therapy  WFL for tasks assessed/performed       Past Medical History:  Diagnosis Date  . Hypertension, essential   . Stroke [facility])     Past Surgical History:  Procedure Laterality Date  . APPENDECTOMY    . boil removal Right 1997    behind ear  . EYE SURGERY Right 07-18-14    There were no vitals filed for this visit.  Subjective Assessment - 10/07/17 1107    Subjective  Pt reporting shoulder feeling better today, but still having pain in upper back near shoulder blades and slightly lower.    Patient Stated Goals  "make the pain go away"    Currently in Pain?  Yes    Pain Score  5     Pain Location  Shoulder    Pain Orientation  Right;Upper    Pain Descriptors / Indicators  -- "warm"    Pain Type  Acute pain    Pain Frequency  Intermittent    Pain Score  6    Pain Location  Back    Pain Orientation  Right;Upper    Pain Descriptors / Indicators  -- "inside" / "deep"    Pain Type  Acute pain    Pain Frequency  Intermittent                      [facility] Adult PT Treatment/Exercise - 10/07/17 1103      Exercises   Exercises  Shoulder      Lumbar Exercises: Stretches   Quadruped Mid Back Stretch  30 seconds;2 reps    Quadruped Mid Back Stretch Limitations  seated prayer stretch - center & to L - over  green Pball 2x30" each      Knee/Hip Exercises: Aerobic   Nustep  lvl 6 x 6' (B LE & R UE)      Shoulder Exercises: Seated   Row  Right;15 reps;Theraband;Strengthening    Theraband Level (Shoulder Row)  Level 3 (Green)    Protraction  Right;5 reps;Theraband    Theraband Level (Shoulder Protraction)  Level 2 (Red)    Protraction Limitations  B pallof press - limited reps d/y onset of R shoulder pain      Shoulder Exercises: Sidelying   Other Sidelying Exercises  R open book stretch 10x5"      Shoulder Exercises: Stretch   Other Shoulder Stretches  seated R UT & LS stretches with R hand anchored on edge of seat x30 sec each    Other Shoulder Stretches  R latissimus/QL stretch in sitting with lateral trunk lean to L 2x30"      Manual Therapy   Manual Therapy  Taping    Kinesiotex  Inhibit Muscle      Kinesiotix   Inhibit Muscle   R QL - 30% stretch in "V" pattern - 1 strip along R lumbar paraspinals, 2nd strip angling to lower ribs             PT Education - 10/07/17 1145    Education provided  Yes    Education Details  Role of kinesiotaping & wearing instructions    Person(s) Educated  Patient;Other (comment) son-in-law    Methods  Explanation    Comprehension  Verbalized understanding       PT Short Term Goals - 10/07/17 1111      PT SHORT TERM GOAL #1   Title  Independent with initial HEP     Status  Achieved        PT Long Term Goals - 09/25/17 1114      PT LONG TERM GOAL #1   Title  Independent with ongoing HEP as indicated     Status  On-going      PT LONG TERM GOAL #2   Title  Pt will demonstrate neutral spine and shoulder posture in sitting with cues </= 25% of time    Status  On-going      PT LONG TERM GOAL #3   Title  Pt will demonstrate R shoulder ROM WFL w/o limitation due to R shoulder or back pain     Status  On-going      PT LONG TERM GOAL #4   Title  R shoulder strength >/= 4/5 w/o limitation due to pain    Status  On-going      PT  LONG TERM GOAL #5   Title  Ambulate with SBQC demonstrating appropriate sequencing & step pattern to decrease R UE strain    Status  On-going            Plan - 10/07/17 1112    Clinical Impression Statement  Pr reporting R shoulder pain better today, although pain reports inconsistent with this. Pt's primary c/o today is R periscapular pain and R lateral low back pain, primarily in lats & QL. Targeted stretching to these muscles as well as R UT and LS followed by core/scapular strengthening/stabilization. Treatment concluded with trial of taping to R QL - will assess response at next viist.    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, flaccid L UE     PT Frequency  2x / week    PT Duration  6 weeks    PT Treatment/Interventions  Patient/family education;ADLs/Self Care Home Management;Neuromuscular re-education;Manual techniques;Dry needling;Taping;Electrical Stimulation;Cryotherapy;Vasopneumatic Device;Iontophoresis 4mg /ml Dexamethasone;Passive range of motion;Moist Heat;Therapeutic exercise;Therapeutic activities;Functional mobility training;Gait training    Consulted and Agree with Plan of Care  Patient       Patient will benefit from skilled therapeutic intervention in order to improve the following deficits and impairments:  Pain, Impaired flexibility, Increased fascial restricitons, Increased muscle spasms, Impaired tone, Decreased strength, Impaired UE functional use, Decreased mobility, Difficulty walking, Abnormal gait  Visit Diagnosis: Acute pain of right shoulder  Abnormal posture  Other muscle spasm  Other symptoms and signs involving the musculoskeletal system  Other abnormalities of gait and mobility     Problem List Patient Active Problem List   Diagnosis Date Noted  . Chest pain 09/14/2014  . HYPERTENSION 05/06/2007  . CEREBROVASCULAR ACCIDENT, HX OF 05/06/2007    [name], PT, [name]  10/07/2017, 12:23 PM  [facility] [address]  [employer] [place], [place], [address] Phone: [phone]   Fax:  [phone]  Name: Danny Lara MRN: [id number] Date of Birth: August 26, 1941

## 2017-10-09 ENCOUNTER — Encounter: Payer: Self-pay | Admitting: Physical Therapy

## 2017-10-09 ENCOUNTER — Ambulatory Visit: Payer: Medicare Other | Admitting: Physical Therapy

## 2017-10-09 DIAGNOSIS — M25511 Pain in right shoulder: Secondary | ICD-10-CM

## 2017-10-09 DIAGNOSIS — M62838 Other muscle spasm: Secondary | ICD-10-CM

## 2017-10-09 DIAGNOSIS — R2689 Other abnormalities of gait and mobility: Secondary | ICD-10-CM

## 2017-10-09 DIAGNOSIS — R293 Abnormal posture: Secondary | ICD-10-CM

## 2017-10-09 DIAGNOSIS — R29898 Other symptoms and signs involving the musculoskeletal system: Secondary | ICD-10-CM | POA: Diagnosis not present

## 2017-10-09 NOTE — Therapy (Signed)
[facility] [address]  [place], [place], [address] Phone: [phone]   Fax:  [phone]  Physical Therapy Treatment  Patient Details  Name: Danny Lara MRN: [id number] Date of Birth: 18-Feb-1941 Referring Provider: R. Hart Robinsons, MD   Encounter Date: 10/09/2017  PT End of Session - 10/09/17 1100    Visit Number  6    Number of Visits  12    Date for PT Re-Evaluation  11/07/17    Authorization Type  Medicare    PT Start Time  1100    PT Stop Time  1151    PT Time Calculation (min)  51 min    Activity Tolerance  Patient tolerated treatment well    Behavior During Therapy  WFL for tasks assessed/performed       Past Medical History:  Diagnosis Date  . Hypertension, essential   . Stroke [facility])     Past Surgical History:  Procedure Laterality Date  . APPENDECTOMY    . boil removal Right 1997    behind ear  . EYE SURGERY Right 07-18-14    There were no vitals filed for this visit.  Subjective Assessment - 10/09/17 1108    Subjective  Pt thinks taping may be helping with back pain.    Patient Stated Goals  "make the pain go away"    Pain Score  5     Pain Location  Shoulder    Pain Orientation  Right;Upper    Pain Score  3    Pain Location  Back    Pain Orientation  Right;Upper;Lower                      [facility] Adult PT Treatment/Exercise - 10/09/17 1100      Exercises   Exercises  Shoulder      Knee/Hip Exercises: Aerobic   Nustep  lvl 6 x 6' (B LE & R UE)      Shoulder Exercises: Supine   Protraction  Right;15 reps;Weights;Strengthening    Protraction Weight (lbs)  4    Horizontal ABduction  Right;15 reps;Theraband;Strengthening    Theraband Level (Shoulder Horizontal ABduction)  Level 2 (Red)    Other Supine Exercises  R shoulder small circles at 90 dg flexion CW/CCW 3# x10    Other Supine Exercises  R shoulder D1/D2 extension & D2 flexion with red TB  x10 each      Shoulder Exercises: Stretch   Other Shoulder Stretches  R 3 way pec stretch with R arm hanging over edge of mat table      Manual Therapy   Manual Therapy  Joint mobilization;Scapular mobilization;Taping;Soft tissue mobilization;Myofascial release    Joint Mobilization  R shoulder - grade II-III inf & A/P mobs    Soft tissue mobilization  R pecs, UT, rhomboids & posterior capusle    Myofascial Release  TPR R pec major, UT & teres group    Scapular Mobilization  R scapula - all directions    Kinesiotex  Inhibit Muscle      Kinesiotix   Inhibit Muscle   R QL - 30% stretch in "V" pattern - 1 strip along R thoracolumbar paraspinals, 2nd strip angling to lower ribs               PT Short Term Goals - 10/07/17 1111      PT SHORT TERM GOAL #1   Title  Independent with initial HEP  Status  Achieved        PT Long Term Goals - 09/25/17 1114      PT LONG TERM GOAL #1   Title  Independent with ongoing HEP as indicated     Status  On-going      PT LONG TERM GOAL #2   Title  Pt will demonstrate neutral spine and shoulder posture in sitting with cues </= 25% of time    Status  On-going      PT LONG TERM GOAL #3   Title  Pt will demonstrate R shoulder ROM WFL w/o limitation due to R shoulder or back pain     Status  On-going      PT LONG TERM GOAL #4   Title  R shoulder strength >/= 4/5 w/o limitation due to pain    Status  On-going      PT LONG TERM GOAL #5   Title  Ambulate with SBQC demonstrating appropriate sequencing & step pattern to decrease R UE strain    Status  On-going            Plan - 10/09/17 1201    Clinical Impression Statement  Pt noting some benefit from taping and requesting that medial strip be extended into thoracic paraspinal, therefore this modification made on reapplication today. Pt continues to demonstrate increased muscle tension with tender taut bands in R pec and posterior capsule, with further STM and stretching directed  at these issues, followed by R shoulder/scapular strengthening. Pt requiring frequent cueing and requests for feedback on tolerance for exercises as he tends to "push through" w/o regard for pain unless closely monitored.    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, flaccid L UE     PT Frequency  2x / week    PT Duration  6 weeks    PT Treatment/Interventions  Patient/family education;ADLs/Self Care Home Management;Neuromuscular re-education;Manual techniques;Dry needling;Taping;Electrical Stimulation;Cryotherapy;Vasopneumatic Device;Iontophoresis 4mg /ml Dexamethasone;Passive range of motion;Moist Heat;Therapeutic exercise;Therapeutic activities;Functional mobility training;Gait training    Consulted and Agree with Plan of Care  Patient       Patient will benefit from skilled therapeutic intervention in order to improve the following deficits and impairments:  Pain, Impaired flexibility, Increased fascial restricitons, Increased muscle spasms, Impaired tone, Decreased strength, Impaired UE functional use, Decreased mobility, Difficulty walking, Abnormal gait  Visit Diagnosis: Acute pain of right shoulder  Abnormal posture  Other muscle spasm  Other symptoms and signs involving the musculoskeletal system  Other abnormalities of gait and mobility     Problem List Patient Active Problem List   Diagnosis Date Noted  . Chest pain 09/14/2014  . HYPERTENSION 05/06/2007  . CEREBROVASCULAR ACCIDENT, HX OF 05/06/2007    [name], PT, [name] 10/09/2017, 12:58 PM  [facility] [address]  [place], [place], [address] Phone: [phone]   Fax:  [phone]  Name: Danny Lara MRN: [id number] Date of Birth: [birth year]

## 2017-10-14 ENCOUNTER — Encounter: Payer: Self-pay | Admitting: Physical Therapy

## 2017-10-14 ENCOUNTER — Ambulatory Visit: Payer: Medicare Other | Admitting: Physical Therapy

## 2017-10-14 DIAGNOSIS — R2689 Other abnormalities of gait and mobility: Secondary | ICD-10-CM

## 2017-10-14 DIAGNOSIS — R293 Abnormal posture: Secondary | ICD-10-CM | POA: Diagnosis not present

## 2017-10-14 DIAGNOSIS — M62838 Other muscle spasm: Secondary | ICD-10-CM | POA: Diagnosis not present

## 2017-10-14 DIAGNOSIS — M25511 Pain in right shoulder: Secondary | ICD-10-CM

## 2017-10-14 DIAGNOSIS — R29898 Other symptoms and signs involving the musculoskeletal system: Secondary | ICD-10-CM | POA: Diagnosis not present

## 2017-10-14 NOTE — Therapy (Signed)
[facility] [address]  [place], [place], [address] Phone: [phone]   Fax:  [phone]  Physical Therapy Treatment  Patient Details  Name: Danny Lara MRN: [id number] Date of Birth: 09-12-1941 Referring Provider: R. Hart Robinsons, MD   Encounter Date: 10/14/2017  PT End of Session - 10/14/17 1100    Visit Number  7    Number of Visits  12    Date for PT Re-Evaluation  11/07/17    Authorization Type  Medicare    PT Start Time  1100    PT Stop Time  1148    PT Time Calculation (min)  48 min    Activity Tolerance  Patient tolerated treatment well    Behavior During Therapy  WFL for tasks assessed/performed       Past Medical History:  Diagnosis Date  . Hypertension, essential   . Stroke [facility])     Past Surgical History:  Procedure Laterality Date  . APPENDECTOMY    . boil removal Right 1997    behind ear  . EYE SURGERY Right 07-18-14    There were no vitals filed for this visit.  Subjective Assessment - 10/14/17 1105    Subjective  Pt reporting shoulder pain seems better.    Patient Stated Goals  "make the pain go away"    Currently in Pain?  No/denies    Pain Score  0-No pain    Pain Location  Shoulder    Pain Orientation  Right    Pain Descriptors / Indicators  Heaviness;Tightness "but not painful"    Multiple Pain Sites  No                      [facility] Adult PT Treatment/Exercise - 10/14/17 1110      Exercises   Exercises  Shoulder      Knee/Hip Exercises: Aerobic   Nustep  lvl 6 x 6' (B LE & R UE)      Shoulder Exercises: Seated   Extension  Right;15 reps;Theraband;Strengthening    Theraband Level (Shoulder Extension)  Level 3 (Green)    Extension Limitations  + scap retraction with extension to neutral    Row  Right;15 reps;Theraband;Strengthening    Theraband Level (Shoulder Row)  Level 3 (Green)    Row Limitations  cues for posture (avoiding  posterior trunk lean & shoulder hiking) & controlled motion      Manual Therapy   Manual Therapy  Soft tissue mobilization;Myofascial release;Scapular mobilization    Manual therapy comments  pt seated with trunk support from PT    Soft tissue mobilization  R pecs, UT, LS, rhomboids & posterior capsule    Myofascial Release  TPR R teres group & rhomboids/medial subscapularis    Scapular Mobilization  R scapula - all directions             PT Education - 10/14/17 1148    Education provided  Yes    Education Details  HEP update - pec/bicep stretch & scapular strengthening    Person(s) Educated  Patient;Other (comment) son-in-law    Methods  Explanation;Demonstration;Handout    Comprehension  Verbalized understanding;Returned demonstration;Need further instruction       PT Short Term Goals - 10/07/17 1111      PT SHORT TERM GOAL #1   Title  Independent with initial HEP     Status  Achieved        PT Long  Term Goals - 09/25/17 1114      PT LONG TERM GOAL #1   Title  Independent with ongoing HEP as indicated     Status  On-going      PT LONG TERM GOAL #2   Title  Pt will demonstrate neutral spine and shoulder posture in sitting with cues </= 25% of time    Status  On-going      PT LONG TERM GOAL #3   Title  Pt will demonstrate R shoulder ROM WFL w/o limitation due to R shoulder or back pain     Status  On-going      PT LONG TERM GOAL #4   Title  R shoulder strength >/= 4/5 w/o limitation due to pain    Status  On-going      PT LONG TERM GOAL #5   Title  Ambulate with SBQC demonstrating appropriate sequencing & step pattern to decrease R UE strain    Status  On-going            Plan - 10/14/17 1110    Clinical Impression Statement  Pt noting improvement in R shoulder and back pain, noting pain now more intermittent (none today) but still feels sensation of heaviness and tightness in R UE esp with overhead reaching. Continued focus on stretching and manual  therapy to reduce muscle tension, followed by scapular strengthening/stabilization. HEP updated to include R pec/bicep stretch as well as scapular exercises.    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, flaccid L UE     PT Frequency  2x / week    PT Duration  6 weeks    PT Treatment/Interventions  Patient/family education;ADLs/Self Care Home Management;Neuromuscular re-education;Manual techniques;Dry needling;Taping;Electrical Stimulation;Cryotherapy;Vasopneumatic Device;Iontophoresis 4mg /ml Dexamethasone;Passive range of motion;Moist Heat;Therapeutic exercise;Therapeutic activities;Functional mobility training;Gait training    Consulted and Agree with Plan of Care  Patient       Patient will benefit from skilled therapeutic intervention in order to improve the following deficits and impairments:  Pain, Impaired flexibility, Increased fascial restricitons, Increased muscle spasms, Impaired tone, Decreased strength, Impaired UE functional use, Decreased mobility, Difficulty walking, Abnormal gait  Visit Diagnosis: Acute pain of right shoulder  Abnormal posture  Other muscle spasm  Other symptoms and signs involving the musculoskeletal system  Other abnormalities of gait and mobility     Problem List Patient Active Problem List   Diagnosis Date Noted  . Chest pain 09/14/2014  . HYPERTENSION 05/06/2007  . CEREBROVASCULAR ACCIDENT, HX OF 05/06/2007    [name], PT, [name] 10/14/2017, 12:07 PM  [facility] [address]  [place], [place], [address] Phone: [phone]   Fax:  [phone]  Name: Katrell Milhorn MRN: [id number] Date of Birth: 1941/08/22

## 2017-10-15 ENCOUNTER — Encounter: Payer: Self-pay | Admitting: Physical Therapy

## 2017-10-15 ENCOUNTER — Ambulatory Visit: Payer: Medicare Other | Admitting: Physical Therapy

## 2017-10-15 DIAGNOSIS — R2689 Other abnormalities of gait and mobility: Secondary | ICD-10-CM

## 2017-10-15 DIAGNOSIS — M62838 Other muscle spasm: Secondary | ICD-10-CM

## 2017-10-15 DIAGNOSIS — R293 Abnormal posture: Secondary | ICD-10-CM | POA: Diagnosis not present

## 2017-10-15 DIAGNOSIS — M25511 Pain in right shoulder: Secondary | ICD-10-CM | POA: Diagnosis not present

## 2017-10-15 DIAGNOSIS — R29898 Other symptoms and signs involving the musculoskeletal system: Secondary | ICD-10-CM

## 2017-10-15 NOTE — Therapy (Signed)
[facility] [address]  [place], [place], [address] Phone: [phone]   Fax:  [phone]  Physical Therapy Treatment  Patient Details  Name: Danny Lara MRN: [id number] Date of Birth: [birth year] Referring Provider: R. Hart Robinsons, MD   Encounter Date: 10/15/2017  PT End of Session - 10/15/17 1104    Visit Number  8    Number of Visits  12    Date for PT Re-Evaluation  11/07/17    Authorization Type  Medicare    PT Start Time  1104    PT Stop Time  1153    PT Time Calculation (min)  49 min    Activity Tolerance  Patient tolerated treatment well    Behavior During Therapy  WFL for tasks assessed/performed       Past Medical History:  Diagnosis Date  . Hypertension, essential   . Stroke [facility])     Past Surgical History:  Procedure Laterality Date  . APPENDECTOMY    . boil removal Right 1997    behind ear  . EYE SURGERY Right 07-18-14    There were no vitals filed for this visit.  Subjective Assessment - 10/15/17 1110    Patient Stated Goals  "make the pain go away"    Currently in Pain?  Yes    Pain Score  7     Pain Location  Shoulder    Pain Orientation  Right;Lateral    Pain Descriptors / Indicators  Tightness;Sharp    Pain Type  Acute pain    Pain Onset  More than a month ago    Pain Frequency  Intermittent                      [facility] Adult PT Treatment/Exercise - 10/15/17 1104      Ambulation/Gait   Gait Comments  Pt remains resitant to attempts at gait training to shift weight off R UE due to fear of falling from L LE weakness, but able to convince pt to try lowering height of SBQC one notch to reduce shoulder hike while walking.      Exercises   Exercises  Shoulder      Knee/Hip Exercises: Aerobic   Nustep  lvl 6 x 6' (B LE & R UE)      Shoulder Exercises: Prone   Extension  Right;15 reps    Extension Limitations  "I" - seated facing backward on  folding chair leaning torso over back of chair    Horizontal ABduction 1  Right;10 reps    Horizontal ABduction 1 Limitations  "T" - seated facing backward on folding chair leaning torso over back of chair    Horizontal ABduction 2  Right;5 reps    Horizontal ABduction 2 Limitations  "Y" - seated facing backward on folding chair leaning torso over back of chair      Shoulder Exercises: Stretch   Other Shoulder Stretches  R 3 way pec stretch with R arm hanging over edge of mat table      Modalities   Modalities  Iontophoresis      Iontophoresis   Type of Iontophoresis  Dexamethasone    Location  R anterolateral shoulder    Dose  80 mA-min, 1.0 mL    Time  4-6 hr patch (#1 of 6)      Manual Therapy   Manual Therapy  Joint mobilization;Soft tissue mobilization;Myofascial release    Joint Mobilization  R shoulder - grade II-III inf & A/P mobs    Soft tissue mobilization  R pecs, ant & lat deltoid    Myofascial Release  TPR R pec major & ant deltoid             PT Education - 10/14/17 1148    Education provided  Yes    Education Details  HEP update - pec/bicep stretch & scapular strengthening    Person(s) Educated  Patient;Other (comment) son-in-law    Methods  Explanation;Demonstration;Handout    Comprehension  Verbalized understanding;Returned demonstration;Need further instruction       PT Short Term Goals - 10/07/17 1111      PT SHORT TERM GOAL #1   Title  Independent with initial HEP     Status  Achieved        PT Long Term Goals - 09/25/17 1114      PT LONG TERM GOAL #1   Title  Independent with ongoing HEP as indicated     Status  On-going      PT LONG TERM GOAL #2   Title  Pt will demonstrate neutral spine and shoulder posture in sitting with cues </= 25% of time    Status  On-going      PT LONG TERM GOAL #3   Title  Pt will demonstrate R shoulder ROM WFL w/o limitation due to R shoulder or back pain     Status  On-going      PT LONG TERM GOAL #4    Title  R shoulder strength >/= 4/5 w/o limitation due to pain    Status  On-going      PT LONG TERM GOAL #5   Title  Ambulate with SBQC demonstrating appropriate sequencing & step pattern to decrease R UE strain    Status  On-going            Plan - 10/15/17 1114    Clinical Impression Statement  Pt reporting increased anterolateral R shoulder pain today with increased muscle tension/tightness and taut bands noted in lateral pec major and anterior and lateral deltoid muscles, therefore manual therapy focusing on these areas today. Pt continues to demonstrate significant scapular weakness with tendency for protracted shoulder, but scapular strengthening limited by inability to use L UE due to hemiparesis and pt reluctance to recruit assistance from family, stating "they are all busy". Able to modify prone I's, T's and Y's using folding chair with pt able to demonstrate "I" w/o much difficulty, but noting icreased weakness/heaviness with attempts at T's & Y's. Pt noting pain improved at end of session as compared to on arrival but persiting in anterolateral shoulder, therefore initiated trial of ionto patch and will assess response on next visit. Pt remains resitant to attempts at gait training to shift weight off R UE due to fear of falling from L LE weakness, but able to convince pt to try lowering height of SBQC one notch to reduce shoulder hike while walking.    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, flaccid L UE     PT Frequency  2x / week    PT Duration  6 weeks    PT Treatment/Interventions  Patient/family education;ADLs/Self Care Home Management;Neuromuscular re-education;Manual techniques;Dry needling;Taping;Electrical Stimulation;Cryotherapy;Vasopneumatic Device;Iontophoresis 4mg /ml Dexamethasone;Passive range of motion;Moist Heat;Therapeutic exercise;Therapeutic activities;Functional mobility  training;Gait training    Consulted and Agree with Plan of Care  Patient  Patient will benefit from skilled therapeutic intervention in order to improve the following deficits and impairments:  Pain, Impaired flexibility, Increased fascial restricitons, Increased muscle spasms, Impaired tone, Decreased strength, Impaired UE functional use, Decreased mobility, Difficulty walking, Abnormal gait  Visit Diagnosis: Acute pain of right shoulder  Abnormal posture  Other muscle spasm  Other symptoms and signs involving the musculoskeletal system  Other abnormalities of gait and mobility     Problem List Patient Active Problem List   Diagnosis Date Noted  . Chest pain 09/14/2014  . HYPERTENSION 05/06/2007  . CEREBROVASCULAR ACCIDENT, HX OF 05/06/2007    [name], PT, [name] 10/15/2017, 12:15 PM  [facility] [address]  [place], [place], [address] Phone: [phone]   Fax:  [phone]  Name: Danny Lara MRN: [id number] Date of Birth: 06/10/1941

## 2017-10-15 NOTE — Patient Instructions (Signed)

## 2017-10-21 ENCOUNTER — Encounter: Payer: Self-pay | Admitting: Physical Therapy

## 2017-10-21 ENCOUNTER — Ambulatory Visit: Payer: Medicare Other | Admitting: Physical Therapy

## 2017-10-21 DIAGNOSIS — R293 Abnormal posture: Secondary | ICD-10-CM

## 2017-10-21 DIAGNOSIS — M25511 Pain in right shoulder: Secondary | ICD-10-CM

## 2017-10-21 DIAGNOSIS — R2689 Other abnormalities of gait and mobility: Secondary | ICD-10-CM

## 2017-10-21 DIAGNOSIS — R29898 Other symptoms and signs involving the musculoskeletal system: Secondary | ICD-10-CM | POA: Diagnosis not present

## 2017-10-21 DIAGNOSIS — M62838 Other muscle spasm: Secondary | ICD-10-CM | POA: Diagnosis not present

## 2017-10-21 NOTE — Therapy (Signed)
[facility] [address]  [place], [place], [address] Phone: [phone]   Fax:  [phone]  Physical Therapy Treatment  Patient Details  Name: Danny Lara MRN: [id number] Date of Birth: [birth year] Referring Provider: R. Hart Robinsons, MD   Encounter Date: 10/21/2017  PT End of Session - 10/21/17 1105    Visit Number  9    Number of Visits  12    Date for PT Re-Evaluation  11/07/17    Authorization Type  Medicare    PT Start Time  1105    PT Stop Time  1151    PT Time Calculation (min)  46 min    Activity Tolerance  Patient tolerated treatment well    Behavior During Therapy  WFL for tasks assessed/performed       Past Medical History:  Diagnosis Date  . Hypertension, essential   . Stroke [facility])     Past Surgical History:  Procedure Laterality Date  . APPENDECTOMY    . boil removal Right 1997    behind ear  . EYE SURGERY Right 07-18-14    There were no vitals filed for this visit.  Subjective Assessment - 10/21/17 1112    Subjective  Pt denies pain today. Pt reports limited ability to perform latest HEP addition due to need for another person assist to secure band.    Patient Stated Goals  "make the pain go away"    Currently in Pain?  No/denies    Pain Score  0-No pain    Pain Onset  More than a month ago                      [facility] Adult PT Treatment/Exercise - 10/21/17 1105      Exercises   Exercises  Shoulder      Knee/Hip Exercises: Aerobic   Nustep  lvl 6 x 6' (B LE & R UE)      Shoulder Exercises: Seated   Extension  Right;15 reps;Theraband;Strengthening    Theraband Level (Shoulder Extension)  Level 3 (Green)    Extension Limitations  + scap retraction with extension to neutral    Row  Right;15 reps;Theraband;Strengthening    Theraband Level (Shoulder Row)  Level 3 (Green)    Row Limitations  cues for posture (avoiding posterior trunk lean & shoulder  hiking) & controlled motion    Flexion  Right;AAROM;AROM;15 reps    Flexion Limitations  PT guidance to activate scapular stabilizers and avoid shoulder hike while promoting proper glenohumeral rhythm    Abduction  Right;AAROM;AROM;15 reps    ABduction Limitations  scaption with PT guidance to activate scapular stabilizers and avoid shoulder hike while promoting proper glenohumeral rhythm      Shoulder Exercises: Prone   Retraction  Right;15 reps;Weights;Strengthening    Retraction Weight (lbs)  2    Retraction Limitations  Row - seated leaning forward on chair with bolster (pt to complete leaning fwd over kitchen table at home)    Extension  Right;15 reps;Weights;Strengthening    Extension Weight (lbs)  1    Extension Limitations  "I" - seated leaning forward on chair with bolster     Horizontal ABduction 1  Right;15 reps;Weights;Strengthening    Horizontal ABduction 1 Weight (lbs)  1    Horizontal ABduction 1 Limitations  "T" - seated leaning forward on chair with bolster     Horizontal ABduction 2  Right;10 reps;Strengthening    Horizontal  ABduction 2 Limitations  "Y" - seated leaning forward on chair with bolster              PT Education - 10/21/17 1150    Education provided  Yes    Education Details  HEP update - prones rows, I's,T's, & Y's    Person(s) Educated  Patient;Caregiver(s) son-in-law    Methods  Explanation;Demonstration;Handout    Comprehension  Verbalized understanding;Returned demonstration;Need further instruction       PT Short Term Goals - 10/07/17 1111      PT SHORT TERM GOAL #1   Title  Independent with initial HEP     Status  Achieved        PT Long Term Goals - 09/25/17 1114      PT LONG TERM GOAL #1   Title  Independent with ongoing HEP as indicated     Status  On-going      PT LONG TERM GOAL #2   Title  Pt will demonstrate neutral spine and shoulder posture in sitting with cues </= 25% of time    Status  On-going      PT LONG TERM  GOAL #3   Title  Pt will demonstrate R shoulder ROM WFL w/o limitation due to R shoulder or back pain     Status  On-going      PT LONG TERM GOAL #4   Title  R shoulder strength >/= 4/5 w/o limitation due to pain    Status  On-going      PT LONG TERM GOAL #5   Title  Ambulate with SBQC demonstrating appropriate sequencing & step pattern to decrease R UE strain    Status  On-going            Plan - 10/21/17 1115    Clinical Impression Statement  Pt reporting limited ability to complete scapular strengthening exercises with TB at home due to needing assist to stabilize band - discussed alterantive options for anchoring band and provided alternative seated prone (over edge of table) scapular strengthening exercises for HEP. Remainder of session focusing on good posture, scapular engagement and proper glenohumeral rhythm with overhead elevation in flexion & scaption.    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, flaccid L UE     PT Frequency  2x / week    PT Duration  6 weeks    PT Treatment/Interventions  Patient/family education;ADLs/Self Care Home Management;Neuromuscular re-education;Manual techniques;Dry needling;Taping;Electrical Stimulation;Cryotherapy;Vasopneumatic Device;Iontophoresis 4mg /ml Dexamethasone;Passive range of motion;Moist Heat;Therapeutic exercise;Therapeutic activities;Functional mobility training;Gait training    PT Next Visit Plan  10th visit PN & G-code    Consulted and Agree with Plan of Care  Patient       Patient will benefit from skilled therapeutic intervention in order to improve the following deficits and impairments:  Pain, Impaired flexibility, Increased fascial restricitons, Increased muscle spasms, Impaired tone, Decreased strength, Impaired UE functional use, Decreased mobility, Difficulty walking, Abnormal gait  Visit Diagnosis: Acute pain of right  shoulder  Abnormal posture  Other muscle spasm  Other symptoms and signs involving the musculoskeletal system  Other abnormalities of gait and mobility     Problem List Patient Active Problem List   Diagnosis Date Noted  . Chest pain 09/14/2014  . HYPERTENSION 05/06/2007  . CEREBROVASCULAR ACCIDENT, HX OF 05/06/2007    [name], PT, [name] 10/21/2017, 12:18 PM  [facility] [address]  [place], [place], [address] Phone: [phone]   Fax:  [phone]  Name: Danny Lara MRN: [id number] Date of Birth: 1941-05-16

## 2017-10-23 ENCOUNTER — Encounter: Payer: Self-pay | Admitting: Physical Therapy

## 2017-10-23 ENCOUNTER — Ambulatory Visit: Payer: Medicare Other | Admitting: Physical Therapy

## 2017-10-23 DIAGNOSIS — R29898 Other symptoms and signs involving the musculoskeletal system: Secondary | ICD-10-CM | POA: Diagnosis not present

## 2017-10-23 DIAGNOSIS — M25511 Pain in right shoulder: Secondary | ICD-10-CM | POA: Diagnosis not present

## 2017-10-23 DIAGNOSIS — M62838 Other muscle spasm: Secondary | ICD-10-CM

## 2017-10-23 DIAGNOSIS — R2689 Other abnormalities of gait and mobility: Secondary | ICD-10-CM | POA: Diagnosis not present

## 2017-10-23 DIAGNOSIS — R293 Abnormal posture: Secondary | ICD-10-CM

## 2017-10-23 NOTE — Therapy (Addendum)
[facility] [address]  [place], [place], [address] Phone: [phone]   Fax:  [phone]  Physical Therapy Treatment  Patient Details  Name: Danny Lara MRN: [id number] Date of Birth: 1941/03/20 Referring Provider: R. Hart Robinsons, MD   Encounter Date: 10/23/2017  PT End of Session - 10/23/17 1105    Visit Number  10    Number of Visits  12    Date for PT Re-Evaluation  11/07/17    Authorization Type  Medicare    PT Start Time  1105    PT Stop Time  1150    PT Time Calculation (min)  45 min    Activity Tolerance  Patient tolerated treatment well    Behavior During Therapy  [facility] for tasks assessed/performed       Past Medical History:  Diagnosis Date  . Hypertension, essential   . Stroke [facility])     Past Surgical History:  Procedure Laterality Date  . APPENDECTOMY    . boil removal Right 1997    behind ear  . EYE SURGERY Right 07-18-14    There were no vitals filed for this visit.  Subjective Assessment - 10/23/17 1108    Subjective  Pt reporting pain more intermittent at this point, noting times where he does not notice any pain.    Patient Stated Goals  "make the pain go away"    Currently in Pain?  Yes    Pain Score  3     Pain Location  Shoulder    Pain Orientation  Right;Anterior;Lateral;Upper    Pain Descriptors / Indicators  Heaviness;Tightness    Pain Type  Acute pain    Pain Onset  More than a month ago    Pain Frequency  Intermittent         [facility] PT Assessment - 10/23/17 1105      Assessment   Medical Diagnosis  R shoulder & back pain    Referring Provider  R. Hart Robinsons, MD    Onset Date/Surgical Date  -- May 2018      AROM   Right Shoulder Flexion  142 Degrees    Right Shoulder ABduction  135 Degrees    Right Shoulder Internal Rotation  -- FIR to L1    Right Shoulder External Rotation  -- FER to C5      PROM   Right Shoulder Flexion  156 Degrees     Right Shoulder ABduction  146 Degrees    Right Shoulder Internal Rotation  78 Degrees    Right Shoulder External Rotation  88 Degrees      Strength   Right Shoulder Flexion  3+/5    Right Shoulder ABduction  3+/5    Right Shoulder Internal Rotation  4/5    Right Shoulder External Rotation  4/5                  [facility] Adult PT Treatment/Exercise - 10/23/17 1105      Exercises   Exercises  Shoulder      Shoulder Exercises: Supine   Flexion  Left;10 reps;Weights;Strengthening 2 sets    Shoulder Flexion Weight (lbs)  2      Shoulder Exercises: Sidelying   External Rotation  Right;15 reps;Weights;Strengthening    External Rotation Weight (lbs)  2    External Rotation Limitations  towel under elbow as prompt to maintain neutral shoulder + cues for scap retraction    ABduction  Right;15 reps;Weights;Strengthening    ABduction Weight (lbs)  1    ABduction Limitations  20-120 dg      Manual Therapy   Manual Therapy  Joint mobilization;Soft tissue mobilization;Myofascial release    Joint Mobilization  R shoulder - grade II-III inf & A/P mobs    Soft tissue mobilization  R pecs, ant & lat deltoid, posterior capsule    Myofascial Release  TPR R pec major, ant deltoid & teres group               PT Short Term Goals - 10/07/17 1111      PT SHORT TERM GOAL #1   Title  Independent with initial HEP     Status  Achieved        PT Long Term Goals - 30-Oct-2017 1113      PT LONG TERM GOAL #1   Title  Independent with ongoing HEP as indicated     Status  On-going      PT LONG TERM GOAL #2   Title  Pt will demonstrate neutral spine and shoulder posture in sitting with cues </= 25% of time    Status  On-going      PT LONG TERM GOAL #3   Title  Pt will demonstrate R shoulder ROM WFL w/o limitation due to R shoulder or back pain     Status  On-going      PT LONG TERM GOAL #4   Title  R shoulder strength >/= 4/5 w/o limitation due to pain    Status  On-going       PT LONG TERM GOAL #5   Title  Ambulate with SBQC demonstrating appropriate sequencing & step pattern to decrease R UE strain    Status  On-going            Plan - October 30, 2017 1202    Clinical Impression Statement  Pride reporting improvement in R shoulder pain, noting pain now more intermittent, but still feeling tightness and heaviness in R shoulder. R shoulder flexion & abduction AROM improving but still limited relative to available PROM. Strength improving but remains limited especially in overhead ranges. Pt will benefit from continued PT to promote restoration of functional ROM and strength in R shoulder.    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, flaccid L UE     PT Frequency  2x / week    PT Duration  6 weeks    PT Treatment/Interventions  Patient/family education;ADLs/Self Care Home Management;Neuromuscular re-education;Manual techniques;Dry needling;Taping;Electrical Stimulation;Cryotherapy;Vasopneumatic Device;Iontophoresis 4mg /ml Dexamethasone;Passive range of motion;Moist Heat;Therapeutic exercise;Therapeutic activities;Functional mobility training;Gait training    Consulted and Agree with Plan of Care  Patient       Patient will benefit from skilled therapeutic intervention in order to improve the following deficits and impairments:  Pain, Impaired flexibility, Increased fascial restricitons, Increased muscle spasms, Impaired tone, Decreased strength, Impaired UE functional use, Decreased mobility, Difficulty walking, Abnormal gait  Visit Diagnosis: Acute pain of right shoulder  Abnormal posture  Other muscle spasm  Other symptoms and signs involving the musculoskeletal system  Other abnormalities of gait and mobility   G-Codes - Oct 30, 2017 1155    Functional Assessment Tool Used (Outpatient Only)  Clinical judgment    Functional Limitation  Changing and maintaining body  position    Mobility: Walking and Moving Around Current Status (U0454)  At least 40 percent but less  than 60 percent impaired, limited or restricted    Mobility: Walking and Moving Around Goal Status [phone])  At least 40 percent but less than 60 percent impaired, limited or restricted       Problem List Patient Active Problem List   Diagnosis Date Noted  . Chest pain 09/14/2014  . HYPERTENSION 05/06/2007  . CEREBROVASCULAR ACCIDENT, HX OF 05/06/2007    [name], PT, [name] 10/23/2017, 1:00 PM  [facility] [address]  [place], [place], [address] Phone: [phone]   Fax:  [phone]  Name: Danny Lara MRN: [id number] Date of Birth: 11-11-1941

## 2017-10-28 DIAGNOSIS — G8114 Spastic hemiplegia affecting left nondominant side: Secondary | ICD-10-CM | POA: Diagnosis not present

## 2017-10-28 DIAGNOSIS — L299 Pruritus, unspecified: Secondary | ICD-10-CM | POA: Diagnosis not present

## 2017-10-30 ENCOUNTER — Encounter: Payer: Self-pay | Admitting: Physical Therapy

## 2017-10-30 ENCOUNTER — Ambulatory Visit: Payer: Medicare Other | Attending: Specialist | Admitting: Physical Therapy

## 2017-10-30 DIAGNOSIS — M62838 Other muscle spasm: Secondary | ICD-10-CM | POA: Diagnosis not present

## 2017-10-30 DIAGNOSIS — M25511 Pain in right shoulder: Secondary | ICD-10-CM | POA: Diagnosis not present

## 2017-10-30 DIAGNOSIS — R293 Abnormal posture: Secondary | ICD-10-CM | POA: Insufficient documentation

## 2017-10-30 DIAGNOSIS — R29898 Other symptoms and signs involving the musculoskeletal system: Secondary | ICD-10-CM

## 2017-10-30 DIAGNOSIS — R2689 Other abnormalities of gait and mobility: Secondary | ICD-10-CM | POA: Diagnosis not present

## 2017-10-30 NOTE — Therapy (Signed)
[facility] [address]  [place], [place], [address] Phone: [phone]   Fax:  [phone]  Physical Therapy Treatment  Patient Details  Name: Danny Lara MRN: [id number] Date of Birth: 10/04/1941 Referring Provider: R. Hart Robinsons, MD   Encounter Date: 10/30/2017  PT End of Session - 10/30/17 1445    Visit Number  11    Number of Visits  12    Date for PT Re-Evaluation  11/07/17    Authorization Type  Medicare    PT Start Time  1445    PT Stop Time  1528    PT Time Calculation (min)  43 min    Activity Tolerance  Patient tolerated treatment well    Behavior During Therapy  [facility] for tasks assessed/performed       Past Medical History:  Diagnosis Date  . Hypertension, essential   . Stroke [facility])     Past Surgical History:  Procedure Laterality Date  . APPENDECTOMY    . boil removal Right 1997    behind ear  . EYE SURGERY Right 07-18-14    There were no vitals filed for this visit.  Subjective Assessment - 10/30/17 1452    Subjective  Pt denies pain or other concerns today.    Patient Stated Goals  "make the pain go away"    Currently in Pain?  No/denies    Pain Score  0-No pain    Pain Onset  More than a month ago                      [facility] Adult PT Treatment/Exercise - 10/30/17 1445      Exercises   Exercises  Shoulder      Knee/Hip Exercises: Aerobic   Nustep  lvl 6 x 6' (B LE & R UE)      Shoulder Exercises: Supine   Protraction  Right;15 reps;Weights;Strengthening    Protraction Weight (lbs)  4    External Rotation  Right;15 reps;Weights;Strengthening    External Rotation Limitations  arm supported on pilllow at ~80 abduction/scaption    Internal Rotation  Right;15 reps;Weights;Strengthening    Internal Rotation Limitations  arm supported on pilllow at ~80 abduction/scaption    Flexion  Right;15 reps;Weights;Strengthening    Shoulder Flexion Weight  (lbs)  2    Other Supine Exercises  R shoulder small circles at 90 dg flexion CW/CCW 3# x15      Manual Therapy   Manual Therapy  Joint mobilization;Soft tissue mobilization;Myofascial release    Joint Mobilization  R shoulder - grade II-III inf & A/P mobs    Soft tissue mobilization  R pecs, ant & lat deltoid, posterior capsule    Myofascial Release  TPR R pec major, ant deltoid & teres group               PT Short Term Goals - 10/07/17 1111      PT SHORT TERM GOAL #1   Title  Independent with initial HEP     Status  Achieved        PT Long Term Goals - 10/23/17 1113      PT LONG TERM GOAL #1   Title  Independent with ongoing HEP as indicated     Status  On-going      PT LONG TERM GOAL #2   Title  Pt will demonstrate neutral spine and shoulder posture in sitting with cues </= 25%  of time    Status  On-going      PT LONG TERM GOAL #3   Title  Pt will demonstrate R shoulder ROM WFL w/o limitation due to R shoulder or back pain     Status  On-going      PT LONG TERM GOAL #4   Title  R shoulder strength >/= 4/5 w/o limitation due to pain    Status  On-going      PT LONG TERM GOAL #5   Title  Ambulate with SBQC demonstrating appropriate sequencing & step pattern to decrease R UE strain    Status  On-going            Plan - 10/30/17 1453    Clinical Impression Statement  Pt denying pain today and able to tolerate progression of strengthening with fatigue noted but no increased pain. Manual therapy addressed continued increased muscle tension in pecs and posterio capsule. Anticipate recert next visit for continued strengthening for imrpoved functional use of R arm.    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, flaccid L UE     PT Frequency  2x / week    PT Duration  6 weeks    PT Treatment/Interventions  Patient/family education;ADLs/Self Care Home Management;Neuromuscular  re-education;Manual techniques;Dry needling;Taping;Electrical Stimulation;Cryotherapy;Vasopneumatic Device;Iontophoresis 4mg /ml Dexamethasone;Passive range of motion;Moist Heat;Therapeutic exercise;Therapeutic activities;Functional mobility training;Gait training    Consulted and Agree with Plan of Care  Patient       Patient will benefit from skilled therapeutic intervention in order to improve the following deficits and impairments:  Pain, Impaired flexibility, Increased fascial restricitons, Increased muscle spasms, Impaired tone, Decreased strength, Impaired UE functional use, Decreased mobility, Difficulty walking, Abnormal gait  Visit Diagnosis: Acute pain of right shoulder  Abnormal posture  Other muscle spasm  Other symptoms and signs involving the musculoskeletal system  Other abnormalities of gait and mobility     Problem List Patient Active Problem List   Diagnosis Date Noted  . Chest pain 09/14/2014  . HYPERTENSION 05/06/2007  . CEREBROVASCULAR ACCIDENT, HX OF 05/06/2007    [name], PT, [name] 10/30/2017, 4:35 PM  [facility] [address]  [place], [place], [address] Phone: [phone]   Fax:  [phone]  Name: Danny Lara MRN: [id number] Date of Birth: 1941-11-01

## 2017-11-04 ENCOUNTER — Ambulatory Visit: Payer: Medicare Other | Admitting: Physical Therapy

## 2017-11-06 ENCOUNTER — Encounter: Payer: Self-pay | Admitting: Physical Therapy

## 2017-11-06 ENCOUNTER — Ambulatory Visit: Payer: Medicare Other | Admitting: Physical Therapy

## 2017-11-06 DIAGNOSIS — M62838 Other muscle spasm: Secondary | ICD-10-CM

## 2017-11-06 DIAGNOSIS — R293 Abnormal posture: Secondary | ICD-10-CM

## 2017-11-06 DIAGNOSIS — R2689 Other abnormalities of gait and mobility: Secondary | ICD-10-CM | POA: Diagnosis not present

## 2017-11-06 DIAGNOSIS — M25511 Pain in right shoulder: Secondary | ICD-10-CM | POA: Diagnosis not present

## 2017-11-06 DIAGNOSIS — R29898 Other symptoms and signs involving the musculoskeletal system: Secondary | ICD-10-CM

## 2017-11-06 NOTE — Therapy (Signed)
[facility] [address]  [place], [place], [address] Phone: [phone]   Fax:  [phone]  Physical Therapy Treatment  Patient Details  Name: Danny Lara MRN: [id number] Date of Birth: 04-Jun-1941 Referring Provider: R. Hart Robinsons, MD   Encounter Date: 11/06/2017  PT End of Session - 11/06/17 1101    Visit Number  12    Number of Visits  20    Date for PT Re-Evaluation  12/12/17    Authorization Type  Medicare    PT Start Time  1101    PT Stop Time  1147    PT Time Calculation (min)  46 min    Activity Tolerance  Patient tolerated treatment well    Behavior During Therapy  [facility] for tasks assessed/performed       Past Medical History:  Diagnosis Date  . Hypertension, essential   . Stroke [facility])     Past Surgical History:  Procedure Laterality Date  . APPENDECTOMY    . boil removal Right 1997    behind ear  . EYE SURGERY Right 07-18-14    There were no vitals filed for this visit.  Subjective Assessment - 11/06/17 1106    Subjective  Pt reports no R shoulder pain pain when pain meds in his system, but up to 7/10 w/o pain meds.    Patient Stated Goals  "make the pain go away"    Currently in Pain?  No/denies    Pain Score  0-No pain up to 7/10 w/o pain meds    Pain Onset  More than a month ago         [facility] PT Assessment - 11/06/17 1101      Assessment   Medical Diagnosis  R shoulder & back pain    Referring Provider  R. Hart Robinsons, MD    Onset Date/Surgical Date  -- May 2018      Prior Function   Level of Independence  Independent with household mobility with device;Needs assistance with ADLs    Vocation  Retired    Leisure  mostly sedentary      AROM   Right Shoulder Flexion  145 Degrees    Right Shoulder ABduction  -- pt unable w/o significant substitution today    Right Shoulder Internal Rotation  -- FIR to L1    Right Shoulder External Rotation  -- FER to C5       PROM   Right Shoulder Flexion  156 Degrees    Right Shoulder ABduction  146 Degrees    Right Shoulder Internal Rotation  78 Degrees    Right Shoulder External Rotation  88 Degrees      Strength   Right Shoulder Flexion  3+/5    Right Shoulder ABduction  3-/5    Right Shoulder Internal Rotation  4/5    Right Shoulder External Rotation  4/5                  [facility] Adult PT Treatment/Exercise - 11/06/17 1101      Exercises   Exercises  Shoulder      Knee/Hip Exercises: Aerobic   Nustep  lvl 6 x 6' (B LE & R UE)      Shoulder Exercises: Supine   Flexion  Right;15 reps;Weights;Strengthening    Shoulder Flexion Weight (lbs)  2 1st 5 reps only    Flexion Limitations  trunk elevated ~30 dg with wedge & pillows; increased verbal &  tactile cueing necessary to avoid shoulder hiking and protraction      Shoulder Exercises: Prone   Retraction  Right;15 reps;Weights;Strengthening    Retraction Weight (lbs)  2    Retraction Limitations  Row - seated leaning forward on chair with bolster (pt to complete leaning fwd over kitchen table at home)    Extension  Right;15 reps;Weights;Strengthening    Extension Weight (lbs)  1    Extension Limitations  "I" - seated leaning forward on chair with bolster       Iontophoresis   Location  deferred today d/t pt reporting of itching on last application requiring him to remove the patch before the full treatment time      Manual Therapy   Manual Therapy  Joint mobilization;Soft tissue mobilization;Myofascial release;Passive ROM    Joint Mobilization  R shoulder - grade II-III inf & A/P mobs    Soft tissue mobilization  R pecs, biceps, ant & lat deltoid, posterior capsule    Myofascial Release  TPR R pec major & biceps    Passive ROM  R shoulder flexion & abduction                PT Short Term Goals - 10/07/17 1111      PT SHORT TERM GOAL #1   Title  Independent with initial HEP     Status  Achieved        PT Long Term  Goals - 11/06/17 1212      PT LONG TERM GOAL #1   Title  Independent with ongoing HEP as indicated     Status  On-going    Target Date  12/12/17      PT LONG TERM GOAL #2   Title  Pt will demonstrate neutral spine and shoulder posture in sitting with cues </= 25% of time    Status  On-going    Target Date  12/12/17      PT LONG TERM GOAL #3   Title  Pt will demonstrate R shoulder ROM WFL w/o limitation due to R shoulder or back pain     Status  On-going    Target Date  12/12/17      PT LONG TERM GOAL #4   Title  R shoulder strength >/= 4/5 w/o limitation due to pain    Status  On-going    Target Date  12/12/17      PT LONG TERM GOAL #5   Title  Ambulate with [facility] demonstrating appropriate sequencing & step pattern to decrease R UE strain    Status  On-going    Target Date  12/12/17            Plan - 11/06/17 1108    Clinical Impression Statement  Danny Lara had demonstrated good progress with PT thus far with decreasing pain, improving postural control and R shoulder ROM; however returns to PT today reporting increased R shoulder pain up to 7/10 when not taking pain meds and demonstrating increased difficulty with AROM both in sitting and reclined position with increased substitution and abnomrla movement patterns depsite repeated verbal and tactile cues. Significant increased tightness and ttp noted in anterior shoulder today which had previously been resolving; esp pecs, proximal biceps and anterior deltoid. Plan had been for recert to focus on continued strengthening for improved functional use of R arm, and will proceed with recert for additional 4 weeks at 2x/wk frequency for strengthening as well as further postural training and ROM progression. Reinforced need for  daily completion of HEP, esp scapular strengthening exercises, as pt admitting to limited compliance recently.    Rehab Potential  Good    Clinical Impairments Affecting Rehab Potential  CVA with L hemiparesis -  ambulates with NBQC & L AFO, flaccid L UE     PT Frequency  2x / week    PT Duration  4 weeks    PT Treatment/Interventions  Patient/family education;ADLs/Self Care Home Management;Neuromuscular re-education;Manual techniques;Dry needling;Taping;Electrical Stimulation;Cryotherapy;Vasopneumatic Device;Iontophoresis 4mg /ml Dexamethasone;Passive range of motion;Moist Heat;Therapeutic exercise;Therapeutic activities;Functional mobility training;Gait training    Consulted and Agree with Plan of Care  Patient       Patient will benefit from skilled therapeutic intervention in order to improve the following deficits and impairments:  Pain, Impaired flexibility, Increased fascial restricitons, Increased muscle spasms, Impaired tone, Decreased strength, Impaired UE functional use, Decreased mobility, Difficulty walking, Abnormal gait  Visit Diagnosis: Acute pain of right shoulder - Plan: PT plan of care cert/re-cert  Abnormal posture - Plan: PT plan of care cert/re-cert  Other muscle spasm - Plan: PT plan of care cert/re-cert  Other symptoms and signs involving the musculoskeletal system - Plan: PT plan of care cert/re-cert  Other abnormalities of gait and mobility - Plan: PT plan of care cert/re-cert     Problem List Patient Active Problem List   Diagnosis Date Noted  . Chest pain 09/14/2014  . HYPERTENSION 05/06/2007  . CEREBROVASCULAR ACCIDENT, HX OF 05/06/2007    [name], PT, [name] 11/06/2017, 12:21 PM  [facility] [address]  [place], [place], [address] Phone: [phone]   Fax:  [phone]  Name: Danny Lara MRN: [id number] Date of Birth: Oct 08, 1941

## 2017-11-11 ENCOUNTER — Ambulatory Visit: Payer: Medicare Other | Admitting: Physical Therapy

## 2017-11-11 DIAGNOSIS — R29898 Other symptoms and signs involving the musculoskeletal system: Secondary | ICD-10-CM | POA: Diagnosis not present

## 2017-11-11 DIAGNOSIS — R293 Abnormal posture: Secondary | ICD-10-CM

## 2017-11-11 DIAGNOSIS — R2689 Other abnormalities of gait and mobility: Secondary | ICD-10-CM | POA: Diagnosis not present

## 2017-11-11 DIAGNOSIS — M25511 Pain in right shoulder: Secondary | ICD-10-CM

## 2017-11-11 DIAGNOSIS — M62838 Other muscle spasm: Secondary | ICD-10-CM | POA: Diagnosis not present

## 2017-11-11 NOTE — Therapy (Signed)
[facility] [address]  [place], [place], [address] Phone: [phone]   Fax:  [phone]  Physical Therapy Treatment  Patient Details  Name: Danny Lara MRN: [id number] Date of Birth: 03/24/1941 Referring Provider: R. Hart Robinsons, MD   Encounter Date: 11/11/2017  PT End of Session - 11/11/17 1105    Visit Number  13    Number of Visits  20    Date for PT Re-Evaluation  12/12/17    Authorization Type  Medicare    PT Start Time  1105    PT Stop Time  1145    PT Time Calculation (min)  40 min    Activity Tolerance  Patient tolerated treatment well    Behavior During Therapy  WFL for tasks assessed/performed       Past Medical History:  Diagnosis Date  . Hypertension, essential   . Stroke [facility])     Past Surgical History:  Procedure Laterality Date  . APPENDECTOMY    . boil removal Right 1997    behind ear  . EYE SURGERY Right 07-18-14    There were no vitals filed for this visit.  Subjective Assessment - 11/11/17 1109    Subjective  Pt reports shoulder feels "tight & heavy", noting difficulty lifting a coffee mug.    Patient Stated Goals  "make the pain go away"    Pain Score  0-No pain    Pain Location  Shoulder    Pain Orientation  Right    Pain Descriptors / Indicators  Tightness;Heaviness    Multiple Pain Sites  No                      [facility] Adult PT Treatment/Exercise - 11/11/17 1105      Ambulation/Gait   Ambulation/Gait Assistance  5: Supervision;4: Min guard    Ambulation/Gait Assistance Details  Verbal & tactile cues/facilitation for upright posture & decreased trunk lean into SBQC to reduce shoulder strain.    Ambulation Distance (Feet)  90 Feet    Assistive device  Small based quad cane    Gait Pattern  Lateral trunk lean to right;Trunk rotated posteriorly on left;Decreased weight shift to left;Decreased hip/knee flexion - left;Poor foot clearance -  left;Decreased step length - right;Decreased step length - left;Step-to pattern heavy lean on quad cane    Ambulation Surface  Level;Indoor    Gait Comments  Pt able to partially correct posture and weight shift but demostrating tendency to return to old habits when cueing withheld.      Exercises   Exercises  Shoulder      Knee/Hip Exercises: Aerobic   Nustep  lvl 6 x 6' (B LE & R UE)      Shoulder Exercises: Seated   Extension  Right;15 reps;Theraband;Strengthening    Theraband Level (Shoulder Extension)  Level 2 (Red)    Extension Limitations  + scap retraction with extension to neutral    Row  Right;15 reps;Theraband;Strengthening    Theraband Level (Shoulder Row)  Level 3 (Green)    Row Limitations  cues for posture (avoiding posterior trunk lean & shoulder hiking) & controlled motion      Shoulder Exercises: Prone   Retraction  Right;15 reps;Weights;Strengthening    Retraction Weight (lbs)  2    Retraction Limitations  Row - seated leaning forward     Extension  Right;15 reps;Weights;Strengthening    Extension Weight (lbs)  2  Extension Limitations  "I" - seated leaning forward     Horizontal ABduction 1  Right;15 reps;AAROM    Horizontal ABduction 1 Limitations  "T" - seated leaning forward (VC & TC to stay forward to better isolate scap retraction)      Shoulder Exercises: Stretch   Other Shoulder Stretches  seated R UT & LS stretches with R hand anchored on edge of seat 2 x 30 sec each    Other Shoulder Stretches  seated R pec stretch with shoulder extended & torso rotation to L 2 x 30"               PT Short Term Goals - 10/07/17 1111      PT SHORT TERM GOAL #1   Title  Independent with initial HEP     Status  Achieved        PT Long Term Goals - 11/06/17 1212      PT LONG TERM GOAL #1   Title  Independent with ongoing HEP as indicated     Status  On-going    Target Date  12/12/17      PT LONG TERM GOAL #2   Title  Pt will demonstrate neutral  spine and shoulder posture in sitting with cues </= 25% of time    Status  On-going    Target Date  12/12/17      PT LONG TERM GOAL #3   Title  Pt will demonstrate R shoulder ROM WFL w/o limitation due to R shoulder or back pain     Status  On-going    Target Date  12/12/17      PT LONG TERM GOAL #4   Title  R shoulder strength >/= 4/5 w/o limitation due to pain    Status  On-going    Target Date  12/12/17      PT LONG TERM GOAL #5   Title  Ambulate with [facility] demonstrating appropriate sequencing & step pattern to decrease R UE strain    Status  On-going    Target Date  12/12/17            Plan - 11/11/17 1112    Clinical Impression Statement  Pt continues to report increased tightness and heaviness in R shoulder/arm but lessening pain today. Still noting limitation with functional use of R UE with activities such as lifting coffee mug. Able to convince pt to work on adjusting posture with gait resulting in decreased strain on R shoulder, but anticipate limited carryover as pt tending to revert to "normal" gait pattern when cueing discontinued. Pt admitting to only completing portions of HEP that he can remember, therefore reviewed stretches and scapular stabilization/strengthening exercises, reinforcing need for consistent compliance at home.    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, flaccid L UE     PT Treatment/Interventions  Patient/family education;ADLs/Self Care Home Management;Neuromuscular re-education;Manual techniques;Dry needling;Taping;Electrical Stimulation;Cryotherapy;Vasopneumatic Device;Iontophoresis 4mg /ml Dexamethasone;Passive range of motion;Moist Heat;Therapeutic exercise;Therapeutic activities;Functional mobility training;Gait training    Consulted and Agree with Plan of Care  Patient       Patient will benefit from skilled therapeutic intervention in order to improve the  following deficits and impairments:  Pain, Impaired flexibility, Increased fascial restricitons, Increased muscle spasms, Impaired tone, Decreased strength, Impaired UE functional use, Decreased mobility, Difficulty walking, Abnormal gait  Visit Diagnosis: Acute pain of right shoulder  Abnormal posture  Other  muscle spasm  Other symptoms and signs involving the musculoskeletal system  Other abnormalities of gait and mobility     Problem List Patient Active Problem List   Diagnosis Date Noted  . Chest pain 09/14/2014  . HYPERTENSION 05/06/2007  . CEREBROVASCULAR ACCIDENT, HX OF 05/06/2007    [name], PT, [name] 11/11/2017, 12:16 PM  [facility] [address]  [place], [place], [address] Phone: [phone]   Fax:  [phone]  Name: Danny Lara MRN: [id number] Date of Birth: Sep 28, 1941

## 2017-11-13 ENCOUNTER — Encounter: Payer: Self-pay | Admitting: Physical Therapy

## 2017-11-13 ENCOUNTER — Ambulatory Visit: Payer: Medicare Other | Admitting: Physical Therapy

## 2017-11-13 DIAGNOSIS — R2689 Other abnormalities of gait and mobility: Secondary | ICD-10-CM | POA: Diagnosis not present

## 2017-11-13 DIAGNOSIS — R29898 Other symptoms and signs involving the musculoskeletal system: Secondary | ICD-10-CM | POA: Diagnosis not present

## 2017-11-13 DIAGNOSIS — M62838 Other muscle spasm: Secondary | ICD-10-CM

## 2017-11-13 DIAGNOSIS — R293 Abnormal posture: Secondary | ICD-10-CM

## 2017-11-13 DIAGNOSIS — M25511 Pain in right shoulder: Secondary | ICD-10-CM

## 2017-11-13 NOTE — Therapy (Signed)
[facility] [address]  [place], [place], [address] Phone: [phone]   Fax:  [phone]  Physical Therapy Treatment  Patient Details  Name: Danny Lara MRN: [id number] Date of Birth: 09-04-1941 Referring Provider: R. Hart Robinsons, MD   Encounter Date: 11/13/2017  PT End of Session - 11/13/17 1101    Visit Number  14    Number of Visits  20    Date for PT Re-Evaluation  12/12/17    Authorization Type  Medicare    PT Start Time  1101    PT Stop Time  1149    PT Time Calculation (min)  48 min    Activity Tolerance  Patient tolerated treatment well    Behavior During Therapy  [facility] for tasks assessed/performed       Past Medical History:  Diagnosis Date  . Hypertension, essential   . Stroke [facility])     Past Surgical History:  Procedure Laterality Date  . APPENDECTOMY    . boil removal Right 1997    behind ear  . EYE SURGERY Right 07-18-14    There were no vitals filed for this visit.  Subjective Assessment - 11/13/17 1110    Subjective  Pt reporting pain more in back of shoulder & upper arm today.    Patient Stated Goals  "make the pain go away"    Currently in Pain?  Yes    Pain Score  6     Pain Location  Shoulder & upper arm    Pain Orientation  Right;Posterior    Pain Type  Acute pain    Pain Frequency  Intermittent                      [facility] Adult PT Treatment/Exercise - 11/13/17 1101      Exercises   Exercises  Shoulder      Knee/Hip Exercises: Aerobic   Nustep  lvl 6 x 6' (B LE & R UE)      Shoulder Exercises: Supine   Flexion  Right;10 reps;Strengthening;Weights 2 sets    Shoulder Flexion Weight (lbs)  2    Flexion Limitations  2nd set with trunk elevated ~30 dg with wedge & pillows; verbal & tactile cueing necessary to avoid shoulder hiking and protraction      Shoulder Exercises: Sidelying   ABduction  Right;15 reps    ABduction Limitations   40-120 dg, tacile cueing/facilitation for scapulohumeral rhythm      Shoulder Exercises: Isometric Strengthening   Extension  Supine;5X5"    Extension Limitations  cues for scap retraction      Manual Therapy   Manual Therapy  Joint mobilization;Soft tissue mobilization;Myofascial release;Passive ROM;Scapular mobilization    Manual therapy comments  pt supine & sidelying    Joint Mobilization  R shoulder - grade II-III inf & A/P mobs    Soft tissue mobilization  R pecs, biceps, deltoids, triceps & lat deltoid, posterior capsule    Myofascial Release  TPR R pec major, supraspinatus, teres group & lats    Scapular Mobilization  R scapular mobs followed by scapular PNF    Passive ROM  R shoulder flexion & abduction                PT Short Term Goals - 10/07/17 1111      PT SHORT TERM GOAL #1   Title  Independent with initial HEP     Status  Achieved        PT Long Term Goals - 11/06/17 1212      PT LONG TERM GOAL #1   Title  Independent with ongoing HEP as indicated     Status  On-going    Target Date  12/12/17      PT LONG TERM GOAL #2   Title  Pt will demonstrate neutral spine and shoulder posture in sitting with cues </= 25% of time    Status  On-going    Target Date  12/12/17      PT LONG TERM GOAL #3   Title  Pt will demonstrate R shoulder ROM WFL w/o limitation due to R shoulder or back pain     Status  On-going    Target Date  12/12/17      PT LONG TERM GOAL #4   Title  R shoulder strength >/= 4/5 w/o limitation due to pain    Status  On-going    Target Date  12/12/17      PT LONG TERM GOAL #5   Title  Ambulate with [facility] demonstrating appropriate sequencing & step pattern to decrease R UE strain    Status  On-going    Target Date  12/12/17            Plan - 11/13/17 1151    Clinical Impression Statement  Pt reporting pain more in posterior shoulder and triceps today with continued feeling of heaviness when attempting to lift or use arm during  daily tasks. Increased muscle tension noted in UT and RTC, esp supraspintus & teres group - responded well to manual therapy with pt noting arm felt "lighter" following this. Shoulder flexion ROM painfree following manual therapy, but pt continues to require cues/facilitation for proper glenohumeral rhythm with scaption/abduction.    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, flaccid L UE     PT Treatment/Interventions  Patient/family education;ADLs/Self Care Home Management;Neuromuscular re-education;Manual techniques;Dry needling;Taping;Electrical Stimulation;Cryotherapy;Vasopneumatic Device;Iontophoresis 4mg /ml Dexamethasone;Passive range of motion;Moist Heat;Therapeutic exercise;Therapeutic activities;Functional mobility training;Gait training    Consulted and Agree with Plan of Care  Patient       Patient will benefit from skilled therapeutic intervention in order to improve the following deficits and impairments:  Pain, Impaired flexibility, Increased fascial restricitons, Increased muscle spasms, Impaired tone, Decreased strength, Impaired UE functional use, Decreased mobility, Difficulty walking, Abnormal gait  Visit Diagnosis: Acute pain of right shoulder  Abnormal posture  Other muscle spasm  Other symptoms and signs involving the musculoskeletal system  Other abnormalities of gait and mobility     Problem List Patient Active Problem List   Diagnosis Date Noted  . Chest pain 09/14/2014  . HYPERTENSION 05/06/2007  . CEREBROVASCULAR ACCIDENT, HX OF 05/06/2007    [name], PT, [name] 11/13/2017, 12:23 PM  [facility] [address]  [place], [place], [address] Phone: [phone]   Fax:  [phone]  Name: Danny Lara MRN: [id number] Date of Birth: 05-21-1941

## 2017-11-20 ENCOUNTER — Ambulatory Visit: Payer: Medicare Other

## 2017-11-20 DIAGNOSIS — M25511 Pain in right shoulder: Secondary | ICD-10-CM | POA: Diagnosis not present

## 2017-11-20 DIAGNOSIS — R2689 Other abnormalities of gait and mobility: Secondary | ICD-10-CM | POA: Diagnosis not present

## 2017-11-20 DIAGNOSIS — R29898 Other symptoms and signs involving the musculoskeletal system: Secondary | ICD-10-CM | POA: Diagnosis not present

## 2017-11-20 DIAGNOSIS — M62838 Other muscle spasm: Secondary | ICD-10-CM | POA: Diagnosis not present

## 2017-11-20 DIAGNOSIS — R293 Abnormal posture: Secondary | ICD-10-CM

## 2017-11-20 NOTE — Therapy (Signed)
[facility] [address]  [place], [place], [address] Phone: [phone]   Fax:  [phone]  Physical Therapy Treatment  Patient Details  Name: Danny Lara MRN: [id number] Date of Birth: [birth year] Referring Provider: R. Hart Robinsons, MD   Encounter Date: 11/20/2017  PT End of Session - 11/20/17 1112    Visit Number  15    Number of Visits  20    Date for PT Re-Evaluation  12/12/17    Authorization Type  Medicare    PT Start Time  1105    PT Stop Time  1155    PT Time Calculation (min)  50 min    Activity Tolerance  Patient tolerated treatment well    Behavior During Therapy  [facility] for tasks assessed/performed       Past Medical History:  Diagnosis Date  . Hypertension, essential   . Stroke [facility])     Past Surgical History:  Procedure Laterality Date  . APPENDECTOMY    . boil removal Right 1997    behind ear  . EYE SURGERY Right 07-18-14    There were no vitals filed for this visit.  Subjective Assessment - 11/20/17 1115    Subjective  Pt. reporting R shoulder "feeling better" today.      Patient Stated Goals  "make the pain go away"    Currently in Pain?  No/denies    Pain Score  0-No pain    Multiple Pain Sites  No                      [facility] Adult PT Treatment/Exercise - 11/20/17 1112      Ambulation/Gait   Ambulation/Gait  Yes    Ambulation/Gait Assistance  5: Supervision;4: Min guard    Ambulation/Gait Assistance Details  verbal/TC for upright posture and explanation for need to reduce excessive wt. shift into R SBQC     Ambulation Distance (Feet)  90 Feet    Assistive device  Small based quad cane    Gait Pattern  Lateral trunk lean to right;Trunk rotated posteriorly on left;Decreased weight shift to left;Decreased hip/knee flexion - left;Poor foot clearance - left;Decreased step length - right;Decreased step length - left;Step-to pattern    Ambulation  Surface  Level;Indoor    Gait Comments  Carryover x 5 steps then returns to old pattern which worsens with fatigue       Knee/Hip Exercises: Aerobic   Nustep  lvl 6 x 6' (B LE & R UE)      Shoulder Exercises: Seated   Extension  Right;15 reps;Theraband;Strengthening    Theraband Level (Shoulder Extension)  Level 2 (Red)    Extension Limitations  + scap retraction with extension to neutral    Row  Right;15 reps;Theraband;Strengthening    Theraband Level (Shoulder Row)  Level 3 (Green)    Row Limitations  cues for posture (avoiding posterior trunk lean & shoulder hiking) & controlled motion    External Rotation  15 reps;Right;Theraband    Theraband Level (Shoulder External Rotation)  Level 2 (Red)    Flexion  Right;AAROM;10 reps mirror feedback to help pt. avoid shoulder "hike"    Flexion Limitations  therapist guidance to activate scapular stabilizers and avoid shoulder hike while promoting proper glenohumeral rhythm    Abduction  Right;AAROM;10 reps mirror feedback to help pt. prevent shoulder "hike"     ABduction Limitations  scaption with therapist guidance to activate scapular stabilizers and  avoid shoulder hike while promoting proper glenohumeral rhythm      Shoulder Exercises: Sidelying   External Rotation  Right;15 reps;Weights;Strengthening    External Rotation Weight (lbs)  2    External Rotation Limitations  TC for scapular retraction     ABduction  Right;15 reps    ABduction Limitations  40-120 dg, tacile cueing/facilitation for scapulohumeral rhythm      Manual Therapy   Manual Therapy  Soft tissue mobilization    Manual therapy comments  seated     Soft tissue mobilization  R pecs, biceps, deltoids, triceps & lat deltoid, posterior capsule    Myofascial Release  TPR to R UT, Supraspinatus                PT Short Term Goals - 10/07/17 1111      PT SHORT TERM GOAL #1   Title  Independent with initial HEP     Status  Achieved        PT Long Term Goals -  11/06/17 1212      PT LONG TERM GOAL #1   Title  Independent with ongoing HEP as indicated     Status  On-going    Target Date  12/12/17      PT LONG TERM GOAL #2   Title  Pt will demonstrate neutral spine and shoulder posture in sitting with cues </= 25% of time    Status  On-going    Target Date  12/12/17      PT LONG TERM GOAL #3   Title  Pt will demonstrate R shoulder ROM WFL w/o limitation due to R shoulder or back pain     Status  On-going    Target Date  12/12/17      PT LONG TERM GOAL #4   Title  R shoulder strength >/= 4/5 w/o limitation due to pain    Status  On-going    Target Date  12/12/17      PT LONG TERM GOAL #5   Title  Ambulate with [facility] demonstrating appropriate sequencing & step pattern to decrease R UE strain    Status  On-going    Target Date  12/12/17            Plan - 11/20/17 1113    Clinical Impression Statement  Pt. reporting, "R shoulder is feeling better today".  Some reports of "heaviness" with gentle strengthening activities today however pt. denying pain throughout therex.  Still quick to revert back to "normal" walking pattern even despite cueing to avoid excessive wt. bearing with R UE through [facility] with gait training.  Pt. admitting to partial compliance with HEP today and encouraged to perform full HEP with explanation of need for upright posture when sitting and need for scapular strengthening exercises at home.  Will continue to progress at pt. able in coming visits.      PT Treatment/Interventions  Patient/family education;ADLs/Self Care Home Management;Neuromuscular re-education;Manual techniques;Dry needling;Taping;Electrical Stimulation;Cryotherapy;Vasopneumatic Device;Iontophoresis 4mg /ml Dexamethasone;Passive range of motion;Moist Heat;Therapeutic exercise;Therapeutic activities;Functional mobility training;Gait training       Patient will benefit from skilled therapeutic intervention in order to improve the following deficits and  impairments:  Pain, Impaired flexibility, Increased fascial restricitons, Increased muscle spasms, Impaired tone, Decreased strength, Impaired UE functional use, Decreased mobility, Difficulty walking, Abnormal gait  Visit Diagnosis: Acute pain of right shoulder  Abnormal posture  Other muscle spasm  Other symptoms and signs involving the musculoskeletal system  Other abnormalities of gait and mobility  Problem List Patient Active Problem List   Diagnosis Date Noted  . Chest pain 09/14/2014  . HYPERTENSION 05/06/2007  . CEREBROVASCULAR ACCIDENT, HX OF 05/06/2007    Bess Harvest, PTA 11/20/17 12:20 PM  [facility] [address]  [place], [place], [address] Phone: [phone]   Fax:  [phone]  Name: Danny Lara MRN: [id number] Date of Birth: September 20, 1941

## 2017-11-24 ENCOUNTER — Encounter: Payer: Self-pay | Admitting: Physical Therapy

## 2017-11-24 ENCOUNTER — Ambulatory Visit: Payer: Medicare Other | Admitting: Physical Therapy

## 2017-11-24 DIAGNOSIS — R29898 Other symptoms and signs involving the musculoskeletal system: Secondary | ICD-10-CM

## 2017-11-24 DIAGNOSIS — M62838 Other muscle spasm: Secondary | ICD-10-CM | POA: Diagnosis not present

## 2017-11-24 DIAGNOSIS — R293 Abnormal posture: Secondary | ICD-10-CM

## 2017-11-24 DIAGNOSIS — R2689 Other abnormalities of gait and mobility: Secondary | ICD-10-CM | POA: Diagnosis not present

## 2017-11-24 DIAGNOSIS — M25511 Pain in right shoulder: Secondary | ICD-10-CM | POA: Diagnosis not present

## 2017-11-24 NOTE — Therapy (Signed)
[facility] [address]  [place], [place], [address] Phone: [phone]   Fax:  [phone]  Physical Therapy Treatment  Patient Details  Name: Danny Lara MRN: [id number] Date of Birth: August 14, 1941 Referring Provider: R. Hart Robinsons, MD   Encounter Date: 11/24/2017  PT End of Session - 11/24/17 1108    Visit Number  16    Number of Visits  20    Date for PT Re-Evaluation  12/12/17    Authorization Type  Medicare    PT Start Time  1108 pt arrived late    PT Stop Time  1147    PT Time Calculation (min)  39 min    Activity Tolerance  Patient tolerated treatment well    Behavior During Therapy  WFL for tasks assessed/performed       Past Medical History:  Diagnosis Date  . Hypertension, essential   . Stroke [facility])     Past Surgical History:  Procedure Laterality Date  . APPENDECTOMY    . boil removal Right 1997    behind ear  . EYE SURGERY Right 07-18-14    There were no vitals filed for this visit.  Subjective Assessment - 11/24/17 1117    Subjective  Pt denies pain today but states R arm feels "heavy". Notes difficulty combing his hair.    Patient Stated Goals  "make the pain go away"    Currently in Pain?  No/denies    Pain Score  0-No pain                      [facility] Adult PT Treatment/Exercise - 11/24/17 1108      Exercises   Exercises  Shoulder      Knee/Hip Exercises: Aerobic   Nustep  lvl 6 x 6' (B LE & R UE)      Shoulder Exercises: Seated   Retraction  Both;20 reps    Retraction Limitations  5" hold - verbal & tactile cues for scap retraction + depression, avoiding shoulder elevation/shrug and posterior trunk lean    Flexion  Right;AAROM;10 reps    Flexion Limitations  wall slides with washcloth - PT guidance for posture and scapulohumeral rhythm    Abduction  Right;AAROM;10 reps    ABduction Limitations  scaption wall slides with washcloth - PT  guidance for posture and scapulohumeral rhythm      Shoulder Exercises: Stretch   Other Shoulder Stretches  3 way seated R pec stretch with shoulder extended & torso rotation to L 2 x 30"      Manual Therapy   Manual Therapy  Soft tissue mobilization    Soft tissue mobilization  R pecs, posterior capsule               PT Short Term Goals - 10/07/17 1111      PT SHORT TERM GOAL #1   Title  Independent with initial HEP     Status  Achieved        PT Long Term Goals - 11/06/17 1212      PT LONG TERM GOAL #1   Title  Independent with ongoing HEP as indicated     Status  On-going    Target Date  12/12/17      PT LONG TERM GOAL #2   Title  Pt will demonstrate neutral spine and shoulder posture in sitting with cues </= 25% of time    Status  On-going  Target Date  12/12/17      PT LONG TERM GOAL #3   Title  Pt will demonstrate R shoulder ROM WFL w/o limitation due to R shoulder or back pain     Status  On-going    Target Date  12/12/17      PT LONG TERM GOAL #4   Title  R shoulder strength >/= 4/5 w/o limitation due to pain    Status  On-going    Target Date  12/12/17      PT LONG TERM GOAL #5   Title  Ambulate with [facility] demonstrating appropriate sequencing & step pattern to decrease R UE strain    Status  On-going    Target Date  12/12/17            Plan - 11/24/17 1119    Clinical Impression Statement  Pt continues to report sensation of "heaviness" in R shoulder and arm limiting functional use of R arm with ADLs. Pt continues to demonstrate poor awareness of posture and and shoulder alignment including during attempts to elevate R arm overhead, with extensive verbal & tactile cueing and faciliation necessary to achieve desire movement pattens despite repeated instruction. Given continued weakness and substitution, question possibility of RTC tear. Pt also continues to report selective complaince with HEP, therefore asked pt to brin prior HEP handouts with  him to next session for review & prioritization.    Rehab Potential  Good    PT Treatment/Interventions  Patient/family education;ADLs/Self Care Home Management;Neuromuscular re-education;Manual techniques;Dry needling;Taping;Electrical Stimulation;Cryotherapy;Vasopneumatic Device;Iontophoresis 4mg /ml Dexamethasone;Passive range of motion;Moist Heat;Therapeutic exercise;Therapeutic activities;Functional mobility training;Gait training    Consulted and Agree with Plan of Care  Patient       Patient will benefit from skilled therapeutic intervention in order to improve the following deficits and impairments:  Pain, Impaired flexibility, Increased fascial restricitons, Increased muscle spasms, Impaired tone, Decreased strength, Impaired UE functional use, Decreased mobility, Difficulty walking, Abnormal gait  Visit Diagnosis: Acute pain of right shoulder  Abnormal posture  Other muscle spasm  Other symptoms and signs involving the musculoskeletal system  Other abnormalities of gait and mobility     Problem List Patient Active Problem List   Diagnosis Date Noted  . Chest pain 09/14/2014  . HYPERTENSION 05/06/2007  . CEREBROVASCULAR ACCIDENT, HX OF 05/06/2007    [name], PT, [name] 11/24/2017, 12:10 PM  [facility] [address]  [place], [place], [address] Phone: [phone]   Fax:  [phone]  Name: Danny Lara MRN: [id number] Date of Birth: 1941-04-19

## 2017-11-27 ENCOUNTER — Encounter: Payer: Self-pay | Admitting: Physical Therapy

## 2017-11-27 ENCOUNTER — Ambulatory Visit: Payer: Medicare Other | Attending: Specialist | Admitting: Physical Therapy

## 2017-11-27 DIAGNOSIS — R293 Abnormal posture: Secondary | ICD-10-CM | POA: Insufficient documentation

## 2017-11-27 DIAGNOSIS — M25511 Pain in right shoulder: Secondary | ICD-10-CM | POA: Diagnosis not present

## 2017-11-27 DIAGNOSIS — M62838 Other muscle spasm: Secondary | ICD-10-CM | POA: Diagnosis not present

## 2017-11-27 DIAGNOSIS — R29898 Other symptoms and signs involving the musculoskeletal system: Secondary | ICD-10-CM | POA: Insufficient documentation

## 2017-11-27 DIAGNOSIS — R2689 Other abnormalities of gait and mobility: Secondary | ICD-10-CM | POA: Diagnosis not present

## 2017-11-27 NOTE — Therapy (Signed)
[facility] [address]  [place], [place], [address] Phone: [phone]   Fax:  [phone]  Physical Therapy Treatment  Patient Details  Name: Danny Lara MRN: [id number] Date of Birth: 1941-09-06 Referring Provider: R. Hart Robinsons, MD   Encounter Date: 11/27/2017  PT End of Session - 11/27/17 1103    Visit Number  17    Number of Visits  20    Date for PT Re-Evaluation  12/12/17    Authorization Type  Medicare    PT Start Time  1103    PT Stop Time  1150    PT Time Calculation (min)  47 min    Activity Tolerance  Patient tolerated treatment well    Behavior During Therapy  WFL for tasks assessed/performed       Past Medical History:  Diagnosis Date  . Hypertension, essential   . Stroke [facility])     Past Surgical History:  Procedure Laterality Date  . APPENDECTOMY    . boil removal Right 1997    behind ear  . EYE SURGERY Right 07-18-14    There were no vitals filed for this visit.  Subjective Assessment - 11/27/17 1115    Subjective  Pt reporting more "heaviness" & "tightness" than pain recently, with this limiting reaching and overhead activities.    Patient Stated Goals  "make the pain go away"    Currently in Pain?  No/denies                      [facility] Adult PT Treatment/Exercise - 11/27/17 1103      Exercises   Exercises  Shoulder      Knee/Hip Exercises: Aerobic   Nustep  lvl 6 x 6' (B LE & R UE)      Shoulder Exercises: Supine   Protraction  Right;15 reps;Weights;Strengthening    Protraction Weight (lbs)  3    Protraction Limitations  trunk elevated ~30 dg with wedge & pillows    Flexion  Right;15 reps;Weights;Strengthening    Shoulder Flexion Weight (lbs)  2    Flexion Limitations  trunk elevated ~30 dg with wedge & pillows; verbal & tactile cueing necessary to avoid shoulder hiking and protraction    Other Supine Exercises  R shoulder small circles at  ~100 dg flexion CW/CCW 2# x15; trunk elevated ~30 dg with wedge & pillows      Shoulder Exercises: ROM/Strengthening   Rhythmic Stabilization, Supine  R shoulder at ~100 dg flexion 2# - 3 x 20"      Manual Therapy   Manual Therapy  Soft tissue mobilization;Passive ROM;Joint mobilization    Joint Mobilization  R shoulder - grade II-III inf & A/P mobs    Soft tissue mobilization  R pecs, biceps, deltoids, triceps & lat deltoid, posterior capsule    Passive ROM  R shoulder flexion & abduction, R pec stretch over edge of mat table               PT Short Term Goals - 10/07/17 1111      PT SHORT TERM GOAL #1   Title  Independent with initial HEP     Status  Achieved        PT Long Term Goals - 11/06/17 1212      PT LONG TERM GOAL #1   Title  Independent with ongoing HEP as indicated     Status  On-going    Target  Date  12/12/17      PT LONG TERM GOAL #2   Title  Pt will demonstrate neutral spine and shoulder posture in sitting with cues </= 25% of time    Status  On-going    Target Date  12/12/17      PT LONG TERM GOAL #3   Title  Pt will demonstrate R shoulder ROM WFL w/o limitation due to R shoulder or back pain     Status  On-going    Target Date  12/12/17      PT LONG TERM GOAL #4   Title  R shoulder strength >/= 4/5 w/o limitation due to pain    Status  On-going    Target Date  12/12/17      PT LONG TERM GOAL #5   Title  Ambulate with [facility] demonstrating appropriate sequencing & step pattern to decrease R UE strain    Status  On-going    Target Date  12/12/17            Plan - 11/27/17 1151    Clinical Impression Statement  Pt denies recent R shoulder pain but still notes limited functional use of R UE due to c/o heaviness & tightness throughout R shoulder complex. Continued tightness present in pecs and posterior capsule, exacerbated by poor postural awareness and increased foward lateral lean on SBQC when ambulating despite repeated cueing and  facilitation for proper alignment. Manual therapy addressing muscular tightness with pt noting shoulder feels "looser and lighter" by end of session, but still struggling with strengthening exercises.    Rehab Potential  Good    PT Treatment/Interventions  Patient/family education;ADLs/Self Care Home Management;Neuromuscular re-education;Manual techniques;Dry needling;Taping;Electrical Stimulation;Cryotherapy;Vasopneumatic Device;Iontophoresis 4mg /ml Dexamethasone;Passive range of motion;Moist Heat;Therapeutic exercise;Therapeutic activities;Functional mobility training;Gait training    Consulted and Agree with Plan of Care  Patient       Patient will benefit from skilled therapeutic intervention in order to improve the following deficits and impairments:  Pain, Impaired flexibility, Increased fascial restricitons, Increased muscle spasms, Impaired tone, Decreased strength, Impaired UE functional use, Decreased mobility, Difficulty walking, Abnormal gait  Visit Diagnosis: Acute pain of right shoulder  Abnormal posture  Other muscle spasm  Other symptoms and signs involving the musculoskeletal system  Other abnormalities of gait and mobility     Problem List Patient Active Problem List   Diagnosis Date Noted  . Chest pain 09/14/2014  . HYPERTENSION 05/06/2007  . CEREBROVASCULAR ACCIDENT, HX OF 05/06/2007    [name], PT, [name] 11/27/2017, 12:22 PM  [facility] [address]  [place], [place], [address] Phone: [phone]   Fax:  [phone]  Name: Danny Lara MRN: [id number] Date of Birth: 1941-09-08

## 2017-12-02 ENCOUNTER — Ambulatory Visit: Payer: Medicare Other | Admitting: Physical Therapy

## 2017-12-04 ENCOUNTER — Encounter: Payer: Self-pay | Admitting: Physical Therapy

## 2017-12-04 ENCOUNTER — Ambulatory Visit: Payer: Medicare Other | Admitting: Physical Therapy

## 2017-12-04 DIAGNOSIS — R29898 Other symptoms and signs involving the musculoskeletal system: Secondary | ICD-10-CM | POA: Diagnosis not present

## 2017-12-04 DIAGNOSIS — R2689 Other abnormalities of gait and mobility: Secondary | ICD-10-CM | POA: Diagnosis not present

## 2017-12-04 DIAGNOSIS — M25511 Pain in right shoulder: Secondary | ICD-10-CM | POA: Diagnosis not present

## 2017-12-04 DIAGNOSIS — M62838 Other muscle spasm: Secondary | ICD-10-CM

## 2017-12-04 DIAGNOSIS — R293 Abnormal posture: Secondary | ICD-10-CM

## 2017-12-04 NOTE — Therapy (Signed)
[facility] [address]  [place], [place], [address] Phone: [phone]   Fax:  [phone]  Physical Therapy Treatment  Patient Details  Name: Danny Lara MRN: [id number] Date of Birth: [birth year] Referring Provider: R. Hart Robinsons, MD   Encounter Date: 12/04/2017  PT End of Session - 12/04/17 1101    Visit Number  18    Number of Visits  20    Date for PT Re-Evaluation  12/12/17    Authorization Type  Medicare    PT Start Time  1101    PT Stop Time  1158    PT Time Calculation (min)  57 min    Activity Tolerance  Patient tolerated treatment well    Behavior During Therapy  WFL for tasks assessed/performed       Past Medical History:  Diagnosis Date  . Hypertension, essential   . Stroke [facility])     Past Surgical History:  Procedure Laterality Date  . APPENDECTOMY    . boil removal Right 1997    behind ear  . EYE SURGERY Right 07-18-14    There were no vitals filed for this visit.  Subjective Assessment - 12/04/17 1111    Subjective  Pt continues to report R shoulder feels "heavy & tight".    Patient Stated Goals  "make the pain go away"    Currently in Pain?  No/denies    Pain Score  0-No pain                      [facility] Adult PT Treatment/Exercise - 12/04/17 1101      Exercises   Exercises  Shoulder      Shoulder Exercises: Seated   Other Seated Exercises  R shoulder retraction & depression with red TB x15 - VC/TC for desired movement pattern    Other Seated Exercises  R posterior shoulder rolls with emphasis on retraction & depression with red TB x10      Shoulder Exercises: Prone   Retraction  Right;15 reps;Weights;Strengthening    Retraction Weight (lbs)  3    Retraction Limitations  Row - seated leaning forward     Extension  Right;15 reps;Weights;Strengthening    Extension Weight (lbs)  2    Extension Limitations  "I" - seated leaning forward      Horizontal ABduction 1  Right;15 reps;AAROM    Horizontal ABduction 1 Limitations  "T" - seated leaning forward (VC & TC to stay forward to better isolate scap retraction)      Shoulder Exercises: ROM/Strengthening   UBE (Upper Arm Bike)  L 1.0 fwd/back x 2.5 min each (R UE only)      Manual Therapy   Manual Therapy  Joint mobilization;Soft tissue mobilization;Myofascial release;Passive ROM    Joint Mobilization  R shoulder - grade II-III inf & A/P mobs    Soft tissue mobilization  R pecs, posterior capsule, UT & LS    Myofascial Release  TPR to R pec major, UT & LS    Passive ROM  R shoulder flexion & abduction                PT Short Term Goals - 10/07/17 1111      PT SHORT TERM GOAL #1   Title  Independent with initial HEP     Status  Achieved        PT Long Term Goals - 11/06/17 1212  PT LONG TERM GOAL #1   Title  Independent with ongoing HEP as indicated     Status  On-going    Target Date  12/12/17      PT LONG TERM GOAL #2   Title  Pt will demonstrate neutral spine and shoulder posture in sitting with cues </= 25% of time    Status  On-going    Target Date  12/12/17      PT LONG TERM GOAL #3   Title  Pt will demonstrate R shoulder ROM WFL w/o limitation due to R shoulder or back pain     Status  On-going    Target Date  12/12/17      PT LONG TERM GOAL #4   Title  R shoulder strength >/= 4/5 w/o limitation due to pain    Status  On-going    Target Date  12/12/17      PT LONG TERM GOAL #5   Title  Ambulate with [facility] demonstrating appropriate sequencing & step pattern to decrease R UE strain    Status  On-going    Target Date  12/12/17            Plan - 12/04/17 1115    Clinical Impression Statement  Pt reports continued limitations in functional use of R UE due to sensation of R arm heaviness & tightness. Pt continues to demonstrate poor awareness of neutral shoudler posture/alignment and attempts to use excessive substitution to accomplish  movements despite repeated tactile, visual and verbal cues and facilitation. Pt appears to be reaching a plateau with progress with PT at this time, therefore discussed status with pt and will plan to review/update HEP on next visit in prep for discharge.    Rehab Potential  Good    PT Treatment/Interventions  Patient/family education;ADLs/Self Care Home Management;Neuromuscular re-education;Manual techniques;Dry needling;Taping;Electrical Stimulation;Cryotherapy;Vasopneumatic Device;Iontophoresis 4mg /ml Dexamethasone;Passive range of motion;Moist Heat;Therapeutic exercise;Therapeutic activities;Functional mobility training;Gait training    Consulted and Agree with Plan of Care  Patient       Patient will benefit from skilled therapeutic intervention in order to improve the following deficits and impairments:  Pain, Impaired flexibility, Increased fascial restricitons, Increased muscle spasms, Impaired tone, Decreased strength, Impaired UE functional use, Decreased mobility, Difficulty walking, Abnormal gait  Visit Diagnosis: Acute pain of right shoulder  Abnormal posture  Other muscle spasm  Other symptoms and signs involving the musculoskeletal system  Other abnormalities of gait and mobility     Problem List Patient Active Problem List   Diagnosis Date Noted  . Chest pain 09/14/2014  . HYPERTENSION 05/06/2007  . CEREBROVASCULAR ACCIDENT, HX OF 05/06/2007    [name], PT, [name] 12/04/2017, 12:39 PM  [facility] [address]  [place], [place], [address] Phone: [phone]   Fax:  [phone]  Name: Danny Lara MRN: [id number] Date of Birth: 09/11/1941

## 2017-12-09 ENCOUNTER — Ambulatory Visit: Payer: Medicare Other | Admitting: Physical Therapy

## 2017-12-09 ENCOUNTER — Encounter: Payer: Self-pay | Admitting: Physical Therapy

## 2017-12-09 DIAGNOSIS — R29898 Other symptoms and signs involving the musculoskeletal system: Secondary | ICD-10-CM | POA: Diagnosis not present

## 2017-12-09 DIAGNOSIS — M62838 Other muscle spasm: Secondary | ICD-10-CM

## 2017-12-09 DIAGNOSIS — R2689 Other abnormalities of gait and mobility: Secondary | ICD-10-CM

## 2017-12-09 DIAGNOSIS — R293 Abnormal posture: Secondary | ICD-10-CM | POA: Diagnosis not present

## 2017-12-09 DIAGNOSIS — M25511 Pain in right shoulder: Secondary | ICD-10-CM

## 2017-12-09 NOTE — Therapy (Signed)
[facility] [address]  [place], [place], [address] Phone: [phone]   Fax:  [phone]  Physical Therapy Treatment  Patient Details  Name: Jasraj Lappe MRN: [id number] Date of Birth: Sep 28, 1941 Referring Provider: R. Hart Robinsons, MD   Encounter Date: 12/09/2017  PT End of Session - 12/09/17 1101    Visit Number  19    Number of Visits  20    Date for PT Re-Evaluation  12/12/17    Authorization Type  Medicare    PT Start Time  1101    PT Stop Time  1148    PT Time Calculation (min)  47 min    Activity Tolerance  Patient tolerated treatment well    Behavior During Therapy  WFL for tasks assessed/performed       Past Medical History:  Diagnosis Date  . Hypertension, essential   . Stroke [facility])     Past Surgical History:  Procedure Laterality Date  . APPENDECTOMY    . boil removal Right 1997    behind ear  . EYE SURGERY Right 07-18-14    There were no vitals filed for this visit.  Subjective Assessment - 12/09/17 1106    Subjective  Pt denying pain, but continued c/o tightness & heaviness.    Patient Stated Goals  "make the pain go away"    Currently in Pain?  No/denies                      [facility] Adult PT Treatment/Exercise - 12/09/17 1101      Exercises   Exercises  Shoulder      Shoulder Exercises: Seated   Flexion  Right;AAROM;15 reps    Flexion Limitations  wall slides with cues for posture & avoid rapid movement and pause for stretch at top of motion    Abduction  Right;AAROM;15 reps    ABduction Limitations  scaption wall slides with cues for posture & avoiding arm position into shoulder extension      Shoulder Exercises: Prone   Retraction  Right;15 reps;Weights;Strengthening    Retraction Weight (lbs)  3    Retraction Limitations  Row - seated leaning forward     Extension  Right;15 reps;Weights;Strengthening    Extension Weight (lbs)  2     Extension Limitations  "I" - seated leaning forward     Horizontal ABduction 1  Right;15 reps;AAROM    Horizontal ABduction 1 Limitations  "T" - seated leaning forward (VC & TC to stay forward to better isolate scap retraction)      Shoulder Exercises: ROM/Strengthening   UBE (Upper Arm Bike)  L 1.0 fwd/back x 2.5 min each (R UE only)      Shoulder Exercises: Stretch   Other Shoulder Stretches  seated R pec stretch with shoulder extended & torso rotation to L 2 x 30"               PT Short Term Goals - 10/07/17 1111      PT SHORT TERM GOAL #1   Title  Independent with initial HEP     Status  Achieved        PT Long Term Goals - 11/06/17 1212      PT LONG TERM GOAL #1   Title  Independent with ongoing HEP as indicated     Status  On-going    Target Date  12/12/17      PT LONG TERM  GOAL #2   Title  Pt will demonstrate neutral spine and shoulder posture in sitting with cues </= 25% of time    Status  On-going    Target Date  12/12/17      PT LONG TERM GOAL #3   Title  Pt will demonstrate R shoulder ROM WFL w/o limitation due to R shoulder or back pain     Status  On-going    Target Date  12/12/17      PT LONG TERM GOAL #4   Title  R shoulder strength >/= 4/5 w/o limitation due to pain    Status  On-going    Target Date  12/12/17      PT LONG TERM GOAL #5   Title  Ambulate with [facility] demonstrating appropriate sequencing & step pattern to decrease R UE strain    Status  On-going    Target Date  12/12/17            Plan - 12/09/17 1108    Clinical Impression Statement  Marion continues to c/o R arm heaviness & tightness, but denies pain. HEP reviewed in anticipation of upcmoing discharge, with pt continuing to require verbal and tactile cueing for neutral spine and shoulder posture along with scapular engagement to reduce risk for impingement. Pt was able better to demonstrate proper gait sequencing today w/o cues, but still demonstrating increased lateral  shift to R with increased weight bearing on SBQC. Anticipate transition to HEP as of next visit due pt plateauing with progress with PT.    Rehab Potential  Good    PT Treatment/Interventions  Patient/family education;ADLs/Self Care Home Management;Neuromuscular re-education;Manual techniques;Dry needling;Taping;Electrical Stimulation;Cryotherapy;Vasopneumatic Device;Iontophoresis 4mg /ml Dexamethasone;Passive range of motion;Moist Heat;Therapeutic exercise;Therapeutic activities;Functional mobility training;Gait training    Consulted and Agree with Plan of Care  Patient       Patient will benefit from skilled therapeutic intervention in order to improve the following deficits and impairments:  Pain, Impaired flexibility, Increased fascial restricitons, Increased muscle spasms, Impaired tone, Decreased strength, Impaired UE functional use, Decreased mobility, Difficulty walking, Abnormal gait  Visit Diagnosis: Acute pain of right shoulder  Abnormal posture  Other muscle spasm  Other symptoms and signs involving the musculoskeletal system  Other abnormalities of gait and mobility     Problem List Patient Active Problem List   Diagnosis Date Noted  . Chest pain 09/14/2014  . HYPERTENSION 05/06/2007  . CEREBROVASCULAR ACCIDENT, HX OF 05/06/2007    [name], PT, [name] 12/09/2017, 12:17 PM  [facility] [address]  [place], [place], [address] Phone: [phone]   Fax:  [phone]  Name: Jaqwan Wieber MRN: [id number] Date of Birth: 04/20/1941

## 2017-12-11 ENCOUNTER — Encounter: Payer: Self-pay | Admitting: Physical Therapy

## 2017-12-11 ENCOUNTER — Ambulatory Visit: Payer: Medicare Other | Admitting: Physical Therapy

## 2017-12-11 DIAGNOSIS — R2689 Other abnormalities of gait and mobility: Secondary | ICD-10-CM

## 2017-12-11 DIAGNOSIS — M62838 Other muscle spasm: Secondary | ICD-10-CM

## 2017-12-11 DIAGNOSIS — R293 Abnormal posture: Secondary | ICD-10-CM | POA: Diagnosis not present

## 2017-12-11 DIAGNOSIS — R29898 Other symptoms and signs involving the musculoskeletal system: Secondary | ICD-10-CM | POA: Diagnosis not present

## 2017-12-11 DIAGNOSIS — M25511 Pain in right shoulder: Secondary | ICD-10-CM | POA: Diagnosis not present

## 2017-12-11 NOTE — Therapy (Addendum)
[facility] [address]  [place], [place], [address] Phone: [phone]   Fax:  [phone]  Physical Therapy Treatment  Patient Details  Name: Danny Lara MRN: [id number] Date of Birth: 1941-07-20 Referring Provider: R. Hart Robinsons, MD   Encounter Date: 12/11/2017  PT End of Session - 12/11/17 1100    Visit Number  20    Number of Visits  20    Date for PT Re-Evaluation  12/12/17    Authorization Type  Medicare    PT Start Time  1100    PT Stop Time  1149    PT Time Calculation (min)  49 min    Activity Tolerance  Patient tolerated treatment well    Behavior During Therapy  WFL for tasks assessed/performed       Past Medical History:  Diagnosis Date  . Hypertension, essential   . Stroke [facility])     Past Surgical History:  Procedure Laterality Date  . APPENDECTOMY    . boil removal Right 1997    behind ear  . EYE SURGERY Right 07-18-14    There were no vitals filed for this visit.  Subjective Assessment - 12/11/17 1104    Patient Stated Goals  "make the pain go away"    Currently in Pain?  No/denies    Pain Score  0-No pain         [facility] PT Assessment - 12/11/17 1100      Assessment   Medical Diagnosis  R shoulder & back pain    Referring Provider  R. Hart Robinsons, MD    Onset Date/Surgical Date  -- May 2018      AROM   Right Shoulder Flexion  81 Degrees 132 dg w/ cues/facilitation necessary to avoid substitution    Right Shoulder ABduction  79 Degrees 135 dg w/ cues/facilitation necessary to avoid substitution    Right Shoulder Internal Rotation  -- FIR to L1    Right Shoulder External Rotation  -- FER to C5      Strength   Right Shoulder Flexion  3-/5    Right Shoulder ABduction  3-/5    Right Shoulder Internal Rotation  4+/5    Right Shoulder External Rotation  4/5                  [facility] Adult PT Treatment/Exercise - 12/11/17 1100       Ambulation/Gait   Ambulation/Gait Assistance  5: Supervision    Ambulation/Gait Assistance Details  review of verbal/TC for upright posture and explanation for need to reduce excessive wt. shift into R SBQC     Ambulation Distance (Feet)  90 Feet    Assistive device  Small based quad cane    Gait Pattern  Lateral trunk lean to right;Trunk rotated posteriorly on left;Decreased weight shift to left;Decreased hip/knee flexion - left;Poor foot clearance - left;Decreased step length - right;Decreased step length - left;Step-to pattern    Ambulation Surface  Level;Indoor    Gait Comments  mirror feedback for postural alignment and avoiding excessive weight shift to R      Exercises   Exercises  Shoulder      Shoulder Exercises: Seated   Flexion  Right;AAROM;AROM;10 reps    Flexion Limitations  mirror feedback + verbal/tactile cues to avoid shoulder hike    Abduction  Right;AAROM;AROM;10 reps    ABduction Limitations  mirror feedback + verbal/tactile cues to avoid shoulder hike  Shoulder Exercises: ROM/Strengthening   UBE (Upper Arm Bike)  L 1.0 fwd/back x 2 min each (R UE only)               PT Short Term Goals - 10/07/17 1111      PT SHORT TERM GOAL #1   Title  Independent with initial HEP     Status  Achieved        PT Long Term Goals - 12/11/17 1105      PT LONG TERM GOAL #1   Title  Independent with ongoing HEP as indicated     Status  Achieved      PT LONG TERM GOAL #2   Title  Pt will demonstrate neutral spine and shoulder posture in sitting with cues </= 25% of time    Status  Achieved      PT LONG TERM GOAL #3   Title  Pt will demonstrate R shoulder ROM WFL w/o limitation due to R shoulder or back pain     Status  Not Met      PT LONG TERM GOAL #4   Title  R shoulder strength >/= 4/5 w/o limitation due to pain    Status  Partially Met      PT LONG TERM GOAL #5   Title  Ambulate with SBQC demonstrating appropriate sequencing & step pattern to decrease R  UE strain    Status  Achieved pt aware of correct gait pattern and can demonstrate proper pattern, but reverts to old habits w/o cues            Plan - 12/11/17 1105    Clinical Impression Statement  Danny Lara initially demonstrated good progress with PT with R UE ROM & strength, but more recently has been complaining of increased heaviness and tightness in R UE/shoulder. PROM remains WFL with mild/moderate pec and posterior capsule tightness persisting, but AROM more significantly limited with pronounced substitution present. Weakness and substitution patterns suggestive of impingement syndrome vs RTC tendinopathy, but pt appears to have reached a plateau with progress with PT. Pt aware of this and feels confident with recommended ongoing HEP, therefore will place pt on 30-day hold at this time, with pt to f/u with MD at his preference.    Rehab Potential  Good    PT Treatment/Interventions  Patient/family education;ADLs/Self Care Home Management;Neuromuscular re-education;Manual techniques;Dry needling;Taping;Electrical Stimulation;Cryotherapy;Vasopneumatic Device;Iontophoresis 19m/ml Dexamethasone;Passive range of motion;Moist Heat;Therapeutic exercise;Therapeutic activities;Functional mobility training;Gait training    PT Next Visit Plan  30 day hold    Consulted and Agree with Plan of Care  Patient       Patient will benefit from skilled therapeutic intervention in order to improve the following deficits and impairments:  Pain, Impaired flexibility, Increased fascial restricitons, Increased muscle spasms, Impaired tone, Decreased strength, Impaired UE functional use, Decreased mobility, Difficulty walking, Abnormal gait  Visit Diagnosis: Acute pain of right shoulder  Abnormal posture  Other muscle spasm  Other symptoms and signs involving the musculoskeletal system  Other abnormalities of gait and mobility     Problem List Patient Active Problem List   Diagnosis Date Noted   . Chest pain 09/14/2014  . HYPERTENSION 05/06/2007  . CEREBROVASCULAR ACCIDENT, HX OF 05/06/2007    JPercival Spanish PT, [name] 12/11/2017, 1:36 PM  [facility] [place] [address]Phone: [phone]  Fax:  [phone] Name: Danny OsmondMRN: [id number] of Birth:  1940-12-31   PHYSICAL THERAPY DISCHARGE SUMMARY  Visits from Start of Care: 20  Current functional level related to goals / functional outcomes:   Refer to above clinical impression for status as of last visit on 12/11/17. Pt was placed on hold for 30 days and has not needed to return to PT, therefore will proceed with discharge from PT for this episode.   Remaining deficits:  As above.   Education / Equipment:   HEP  Plan: Patient agrees to discharge.  Patient goals were partially met. Patient is being discharged due to lack of progress.  ?????     [name], PT, [name] 01/22/18, 3:08 PM  [facility] [address]  [place], [place], [address] Phone: [phone]   Fax:  [phone]

## 2017-12-16 ENCOUNTER — Ambulatory Visit: Payer: Medicare Other | Admitting: Physical Therapy

## 2017-12-18 ENCOUNTER — Ambulatory Visit: Payer: Medicare Other | Admitting: Physical Therapy

## 2017-12-23 ENCOUNTER — Ambulatory Visit: Payer: Medicare Other | Admitting: Physical Therapy

## 2017-12-25 ENCOUNTER — Ambulatory Visit: Payer: Medicare Other | Admitting: Physical Therapy

## 2018-01-22 DIAGNOSIS — M2550 Pain in unspecified joint: Secondary | ICD-10-CM | POA: Diagnosis not present

## 2018-01-22 DIAGNOSIS — M25511 Pain in right shoulder: Secondary | ICD-10-CM | POA: Diagnosis not present

## 2018-01-22 DIAGNOSIS — M150 Primary generalized (osteo)arthritis: Secondary | ICD-10-CM | POA: Diagnosis not present

## 2018-01-22 DIAGNOSIS — Z6823 Body mass index (BMI) 23.0-23.9, adult: Secondary | ICD-10-CM | POA: Diagnosis not present

## 2018-01-27 DIAGNOSIS — M25511 Pain in right shoulder: Secondary | ICD-10-CM | POA: Diagnosis not present

## 2018-01-27 DIAGNOSIS — M7541 Impingement syndrome of right shoulder: Secondary | ICD-10-CM | POA: Diagnosis not present

## 2018-03-03 DIAGNOSIS — L2081 Atopic neurodermatitis: Secondary | ICD-10-CM | POA: Diagnosis not present

## 2018-03-03 DIAGNOSIS — I69359 Hemiplegia and hemiparesis following cerebral infarction affecting unspecified side: Secondary | ICD-10-CM | POA: Diagnosis not present

## 2018-03-03 DIAGNOSIS — M7122 Synovial cyst of popliteal space [Baker], left knee: Secondary | ICD-10-CM | POA: Diagnosis not present

## 2018-03-03 DIAGNOSIS — M7672 Peroneal tendinitis, left leg: Secondary | ICD-10-CM | POA: Diagnosis not present

## 2018-05-09 ENCOUNTER — Other Ambulatory Visit: Payer: Self-pay

## 2018-05-09 ENCOUNTER — Encounter: Payer: Self-pay | Admitting: Emergency Medicine

## 2018-05-09 ENCOUNTER — Emergency Department (INDEPENDENT_AMBULATORY_CARE_PROVIDER_SITE_OTHER)
Admission: EM | Admit: 2018-05-09 | Discharge: 2018-05-09 | Disposition: A | Discharge status: Home / Self Care | Payer: Medicare Other | Source: Home / Self Care | Attending: Family Medicine | Admitting: Family Medicine

## 2018-05-09 DIAGNOSIS — M25562 Pain in left knee: Secondary | ICD-10-CM

## 2018-05-09 DIAGNOSIS — H00015 Hordeolum externum left lower eyelid: Secondary | ICD-10-CM

## 2018-05-09 DIAGNOSIS — M25561 Pain in right knee: Secondary | ICD-10-CM

## 2018-05-09 DIAGNOSIS — B029 Zoster without complications: Secondary | ICD-10-CM | POA: Diagnosis not present

## 2018-05-09 MED ORDER — VALACYCLOVIR HCL 1 G PO TABS: 1000.0000 mg | ORAL_TABLET | Freq: Three times a day (TID) | ORAL | 0 refills | Status: AC

## 2018-05-09 NOTE — ED Triage Notes (Signed)
Patient in w/c due to previous CVA. States painful rash on right side chest/trunk for past 4 days; also pain in right thigh and left knee; also possible stye left lower eye lid.

## 2018-05-09 NOTE — Discharge Instructions (Signed)
°  Please take the medication as prescribed. Please follow up with your family doctor in 4-5 days if not improving, sooner if worsening.

## 2018-05-09 NOTE — ED Provider Notes (Signed)
[name]    CSN: [id number] Arrival date & time: 05/09/18  1244     History   Chief Complaint Chief Complaint  Patient presents with  . Rash  . Leg Pain  . Eye Problem    HPI Danny Lara is a 77 y.o. male.   HPI  Danny Lara is a 77 y.o. male presenting to UC with c/o 2 days of an itching, burning, blistering rash on his Right side and Right upper chest.  Associated pain started 4 days ago, then the rash appeared 2 days ago.  He reports noticing clear fluid draining from it. No new soaps, lotions or medications. He has had chicken pox when he was younger. He is also c/o leg soreness and believes it is due to his arthritis.  He has not taken any Tylenol or Motrin but he does take gabapentin, 100mg  TID for over 1 year. He is not sure it is helping anymore.  Pt c/o unrelated left lower eyelid soreness and mild redness that started this morning.  No change in vision. No discharge.    Past Medical History:  Diagnosis Date  . Hypertension, essential   . Stroke [facility])     Patient Active Problem List   Diagnosis Date Noted  . Chest pain 09/14/2014  . HYPERTENSION 05/06/2007  . CEREBROVASCULAR ACCIDENT, HX OF 05/06/2007    Past Surgical History:  Procedure Laterality Date  . APPENDECTOMY    . boil removal Right 1997    behind ear  . EYE SURGERY Right 07-18-14       Home Medications    Prior to Admission medications   Medication Sig Start Date End Date Taking? Authorizing Provider  gabapentin (NEURONTIN) 100 MG capsule Take 100 mg by mouth 3 (three) times daily.   Yes [provider]  amLODipine (NORVASC) 10 MG tablet Take 10 mg by mouth daily.    [provider]  aspirin 81 MG tablet Take 81 mg by mouth daily.    [provider]  atorvastatin (LIPITOR) 10 MG tablet Take 10 mg by mouth daily.    [provider]  b complex vitamins capsule Take 1 capsule by mouth daily.    [provider]  cholecalciferol (VITAMIN D) 1000 units tablet Take 2,000 Units by mouth daily.    [provider]  clobetasol ointment (TEMOVATE) 1.19 % Apply 1 application topically 2 (two) times daily as needed.    [provider]  omeprazole (PRILOSEC) 20 MG capsule Take 1 capsule (20 mg total) by mouth daily. 09/14/14   Dorothy Spark, MD  omeprazole (PRILOSEC) 20 MG capsule TAKE 1 CAPSULE BY MOUTH DAILY 06/01/15   Dorothy Spark, MD  potassium chloride SA (K-DUR,KLOR-CON) 20 MEQ tablet Take 10 mEq by mouth once.    [provider]  predniSONE (DELTASONE) 10 MG tablet Take 10 mg by mouth daily with breakfast.  10/26/14   [provider]  spironolactone (ALDACTONE) 25 MG tablet Take 25 mg by mouth daily.    [provider]  triamcinolone cream (KENALOG) 0.1 % Apply 1 application topically 2 (two) times daily.    [provider]  valACYclovir (VALTREX) 1000 MG tablet Take 1 tablet (1,000 mg total) by mouth 3 (three) times daily. 05/09/18   Noe Gens, PA-C    Family History Family History  Problem Relation Age of Onset  . Colon cancer Brother   . Diabetes Brother   . CVA Daughter 29  .  Aneurysm Daughter 83  . Tuberculosis Son   . Hypertension Daughter     Social History Social History   Tobacco Use  . Smoking status: Former Research scientist (life sciences)  . Smokeless tobacco: Never Used  Substance Use Topics  . Alcohol use: No  . Drug use: No     Allergies   Patient has no known allergies.   Review of Systems Review of Systems  Constitutional: Positive for fatigue ( mild). Negative for chills and fever.  HENT: Negative for congestion and sore throat.   Respiratory: Negative for cough and shortness of breath.   Gastrointestinal: Positive for nausea. Negative for abdominal pain, diarrhea and vomiting.  Musculoskeletal: Positive for arthralgias and myalgias.  Skin: Positive for rash.     Physical Exam Triage Vital Signs ED  Triage Vitals  Enc Vitals Group     BP 05/09/18 1319 127/76     Pulse Rate 05/09/18 1319 77     Resp 05/09/18 1319 16     Temp 05/09/18 1319 99.1 F (37.3 C)     Temp Source 05/09/18 1319 Oral     SpO2 05/09/18 1319 97 %     Weight 05/09/18 1320 141 lb (64 kg)     Height 05/09/18 1320 5\' 4"  (1.626 m)     Head Circumference --      Peak Flow --      Pain Score 05/09/18 1320 2     Pain Loc --      Pain Edu? --      Excl. in [place]? --    No data found.  Updated Vital Signs BP 127/76 (BP Location: Right Arm)   Pulse 77   Temp 99.1 F (37.3 C) (Oral)   Resp 16   Ht 5\' 4"  (1.626 m)   Wt 141 lb (64 kg)   SpO2 97%   BMI 24.20 kg/m   Visual Acuity Right Eye Distance:   Left Eye Distance:   Bilateral Distance:    Right Eye Near:   Left Eye Near:    Bilateral Near:     Physical Exam  Constitutional: He is oriented to person, place, and time. He appears well-developed and well-nourished. No distress.  HENT:  Head: Normocephalic and atraumatic.  Mouth/Throat: Oropharynx is clear and moist.  Eyes: Pupils are equal, round, and reactive to light. Conjunctivae and EOM are normal. Left eye exhibits hordeolum.    Neck: Normal range of motion.  Cardiovascular: Normal rate and regular rhythm.  Pulmonary/Chest: Effort normal. No respiratory distress. He exhibits tenderness.  Right side of chest: multiple clear fluid filled vesicles on erythematous base in dermatomal distribution. Mildly tender.    Musculoskeletal: Normal range of motion.  Neurological: He is alert and oriented to person, place, and time.  Skin: Skin is warm and dry. He is not diaphoretic.  Psychiatric: He has a normal mood and affect. His behavior is normal.  Nursing note and vitals reviewed.    UC Treatments / Results  Labs (all labs ordered are listed, but only abnormal results are displayed) Labs Reviewed - No data to display  EKG None  Radiology No results found.  Procedures Procedures (including  critical care time)  Medications Ordered in UC Medications - No data to display  Initial Impression / Assessment and Plan / UC Course  I have reviewed the triage vital signs and the nursing notes.  Pertinent labs & imaging results that were available during my care of the patient were reviewed by me and considered  in my medical decision making (see chart for details).     Hx and exam c/w shingles Leg cramps possibly due to shingles virus . Left lower eyelid c/w small stye.  Final Clinical Impressions(s) / UC Diagnoses   Final diagnoses:  Herpes zoster without complication  Hordeolum of left lower eyelid, unspecified hordeolum type  Arthralgia of both lower legs     Discharge Instructions      Please take the medication as prescribed. Please follow up with your family doctor in 4-5 days if not improving, sooner if worsening.     ED Prescriptions    Medication Sig Dispense Auth. Provider   valACYclovir (VALTREX) 1000 MG tablet Take 1 tablet (1,000 mg total) by mouth 3 (three) times daily. 21 tablet Noe Gens, PA-C     Controlled Substance Prescriptions [place] Controlled Substance Registry consulted? Not Applicable   Tyrell Antonio 05/09/18 1542

## 2018-06-19 DIAGNOSIS — L309 Dermatitis, unspecified: Secondary | ICD-10-CM | POA: Diagnosis not present

## 2018-06-19 DIAGNOSIS — B0229 Other postherpetic nervous system involvement: Secondary | ICD-10-CM | POA: Diagnosis not present

## 2018-06-19 DIAGNOSIS — M179 Osteoarthritis of knee, unspecified: Secondary | ICD-10-CM | POA: Diagnosis not present

## 2018-08-05 DIAGNOSIS — M150 Primary generalized (osteo)arthritis: Secondary | ICD-10-CM | POA: Diagnosis not present

## 2018-08-05 DIAGNOSIS — Z6822 Body mass index (BMI) 22.0-22.9, adult: Secondary | ICD-10-CM | POA: Diagnosis not present

## 2018-08-05 DIAGNOSIS — M21372 Foot drop, left foot: Secondary | ICD-10-CM | POA: Diagnosis not present

## 2018-08-05 DIAGNOSIS — M2550 Pain in unspecified joint: Secondary | ICD-10-CM | POA: Diagnosis not present

## 2018-08-05 DIAGNOSIS — M25511 Pain in right shoulder: Secondary | ICD-10-CM | POA: Diagnosis not present

## 2018-08-05 DIAGNOSIS — Z8673 Personal history of transient ischemic attack (TIA), and cerebral infarction without residual deficits: Secondary | ICD-10-CM | POA: Diagnosis not present

## 2018-08-13 DIAGNOSIS — M2550 Pain in unspecified joint: Secondary | ICD-10-CM | POA: Diagnosis not present

## 2018-08-13 DIAGNOSIS — M79641 Pain in right hand: Secondary | ICD-10-CM | POA: Diagnosis not present

## 2018-08-19 DIAGNOSIS — M25562 Pain in left knee: Secondary | ICD-10-CM | POA: Diagnosis not present

## 2018-08-19 DIAGNOSIS — M79662 Pain in left lower leg: Secondary | ICD-10-CM | POA: Diagnosis not present

## 2018-08-19 DIAGNOSIS — R29898 Other symptoms and signs involving the musculoskeletal system: Secondary | ICD-10-CM | POA: Diagnosis not present

## 2018-08-19 DIAGNOSIS — I69352 Hemiplegia and hemiparesis following cerebral infarction affecting left dominant side: Secondary | ICD-10-CM | POA: Diagnosis not present

## 2018-08-25 DIAGNOSIS — R29898 Other symptoms and signs involving the musculoskeletal system: Secondary | ICD-10-CM | POA: Diagnosis not present

## 2018-08-25 DIAGNOSIS — M25562 Pain in left knee: Secondary | ICD-10-CM | POA: Diagnosis not present

## 2018-08-25 DIAGNOSIS — M79662 Pain in left lower leg: Secondary | ICD-10-CM | POA: Diagnosis not present

## 2018-08-25 DIAGNOSIS — I69352 Hemiplegia and hemiparesis following cerebral infarction affecting left dominant side: Secondary | ICD-10-CM | POA: Diagnosis not present

## 2018-08-25 DIAGNOSIS — M79641 Pain in right hand: Secondary | ICD-10-CM | POA: Diagnosis not present

## 2018-08-25 DIAGNOSIS — M2550 Pain in unspecified joint: Secondary | ICD-10-CM | POA: Diagnosis not present

## 2018-09-01 DIAGNOSIS — I69352 Hemiplegia and hemiparesis following cerebral infarction affecting left dominant side: Secondary | ICD-10-CM | POA: Diagnosis not present

## 2018-09-01 DIAGNOSIS — M79662 Pain in left lower leg: Secondary | ICD-10-CM | POA: Diagnosis not present

## 2018-09-01 DIAGNOSIS — M25562 Pain in left knee: Secondary | ICD-10-CM | POA: Diagnosis not present

## 2018-09-01 DIAGNOSIS — R29898 Other symptoms and signs involving the musculoskeletal system: Secondary | ICD-10-CM | POA: Diagnosis not present

## 2018-09-01 DIAGNOSIS — M79641 Pain in right hand: Secondary | ICD-10-CM | POA: Diagnosis not present

## 2018-09-03 DIAGNOSIS — M79662 Pain in left lower leg: Secondary | ICD-10-CM | POA: Diagnosis not present

## 2018-09-03 DIAGNOSIS — I69352 Hemiplegia and hemiparesis following cerebral infarction affecting left dominant side: Secondary | ICD-10-CM | POA: Diagnosis not present

## 2018-09-03 DIAGNOSIS — M2550 Pain in unspecified joint: Secondary | ICD-10-CM | POA: Diagnosis not present

## 2018-09-03 DIAGNOSIS — M25562 Pain in left knee: Secondary | ICD-10-CM | POA: Diagnosis not present

## 2018-09-03 DIAGNOSIS — M79641 Pain in right hand: Secondary | ICD-10-CM | POA: Diagnosis not present

## 2018-09-03 DIAGNOSIS — R29898 Other symptoms and signs involving the musculoskeletal system: Secondary | ICD-10-CM | POA: Diagnosis not present

## 2018-09-08 DIAGNOSIS — R29898 Other symptoms and signs involving the musculoskeletal system: Secondary | ICD-10-CM | POA: Diagnosis not present

## 2018-09-08 DIAGNOSIS — M79641 Pain in right hand: Secondary | ICD-10-CM | POA: Diagnosis not present

## 2018-09-08 DIAGNOSIS — M25562 Pain in left knee: Secondary | ICD-10-CM | POA: Diagnosis not present

## 2018-09-08 DIAGNOSIS — I69352 Hemiplegia and hemiparesis following cerebral infarction affecting left dominant side: Secondary | ICD-10-CM | POA: Diagnosis not present

## 2018-09-08 DIAGNOSIS — M79662 Pain in left lower leg: Secondary | ICD-10-CM | POA: Diagnosis not present

## 2018-09-10 DIAGNOSIS — M25562 Pain in left knee: Secondary | ICD-10-CM | POA: Diagnosis not present

## 2018-09-10 DIAGNOSIS — I69352 Hemiplegia and hemiparesis following cerebral infarction affecting left dominant side: Secondary | ICD-10-CM | POA: Diagnosis not present

## 2018-09-10 DIAGNOSIS — M79662 Pain in left lower leg: Secondary | ICD-10-CM | POA: Diagnosis not present

## 2018-09-10 DIAGNOSIS — M79641 Pain in right hand: Secondary | ICD-10-CM | POA: Diagnosis not present

## 2018-09-10 DIAGNOSIS — R29898 Other symptoms and signs involving the musculoskeletal system: Secondary | ICD-10-CM | POA: Diagnosis not present

## 2018-09-15 DIAGNOSIS — R29898 Other symptoms and signs involving the musculoskeletal system: Secondary | ICD-10-CM | POA: Diagnosis not present

## 2018-09-15 DIAGNOSIS — M25562 Pain in left knee: Secondary | ICD-10-CM | POA: Diagnosis not present

## 2018-09-15 DIAGNOSIS — M79662 Pain in left lower leg: Secondary | ICD-10-CM | POA: Diagnosis not present

## 2018-09-15 DIAGNOSIS — I69352 Hemiplegia and hemiparesis following cerebral infarction affecting left dominant side: Secondary | ICD-10-CM | POA: Diagnosis not present

## 2018-09-15 DIAGNOSIS — M79641 Pain in right hand: Secondary | ICD-10-CM | POA: Diagnosis not present

## 2018-09-17 DIAGNOSIS — M79641 Pain in right hand: Secondary | ICD-10-CM | POA: Diagnosis not present

## 2018-09-17 DIAGNOSIS — I69352 Hemiplegia and hemiparesis following cerebral infarction affecting left dominant side: Secondary | ICD-10-CM | POA: Diagnosis not present

## 2018-09-17 DIAGNOSIS — R29898 Other symptoms and signs involving the musculoskeletal system: Secondary | ICD-10-CM | POA: Diagnosis not present

## 2018-09-17 DIAGNOSIS — M2550 Pain in unspecified joint: Secondary | ICD-10-CM | POA: Diagnosis not present

## 2018-09-17 DIAGNOSIS — M25562 Pain in left knee: Secondary | ICD-10-CM | POA: Diagnosis not present

## 2018-09-17 DIAGNOSIS — M79662 Pain in left lower leg: Secondary | ICD-10-CM | POA: Diagnosis not present

## 2018-09-24 DIAGNOSIS — I69352 Hemiplegia and hemiparesis following cerebral infarction affecting left dominant side: Secondary | ICD-10-CM | POA: Diagnosis not present

## 2018-09-24 DIAGNOSIS — M79662 Pain in left lower leg: Secondary | ICD-10-CM | POA: Diagnosis not present

## 2018-09-24 DIAGNOSIS — M79641 Pain in right hand: Secondary | ICD-10-CM | POA: Diagnosis not present

## 2018-09-24 DIAGNOSIS — R29898 Other symptoms and signs involving the musculoskeletal system: Secondary | ICD-10-CM | POA: Diagnosis not present

## 2018-09-24 DIAGNOSIS — M25562 Pain in left knee: Secondary | ICD-10-CM | POA: Diagnosis not present

## 2018-09-29 DIAGNOSIS — M25562 Pain in left knee: Secondary | ICD-10-CM | POA: Diagnosis not present

## 2018-09-29 DIAGNOSIS — M79641 Pain in right hand: Secondary | ICD-10-CM | POA: Diagnosis not present

## 2018-09-29 DIAGNOSIS — M79662 Pain in left lower leg: Secondary | ICD-10-CM | POA: Diagnosis not present

## 2018-09-29 DIAGNOSIS — I69352 Hemiplegia and hemiparesis following cerebral infarction affecting left dominant side: Secondary | ICD-10-CM | POA: Diagnosis not present

## 2018-09-29 DIAGNOSIS — R29898 Other symptoms and signs involving the musculoskeletal system: Secondary | ICD-10-CM | POA: Diagnosis not present

## 2018-10-01 DIAGNOSIS — M79641 Pain in right hand: Secondary | ICD-10-CM | POA: Diagnosis not present

## 2018-10-01 DIAGNOSIS — R29898 Other symptoms and signs involving the musculoskeletal system: Secondary | ICD-10-CM | POA: Diagnosis not present

## 2018-10-01 DIAGNOSIS — I69352 Hemiplegia and hemiparesis following cerebral infarction affecting left dominant side: Secondary | ICD-10-CM | POA: Diagnosis not present

## 2018-10-01 DIAGNOSIS — M79662 Pain in left lower leg: Secondary | ICD-10-CM | POA: Diagnosis not present

## 2018-10-01 DIAGNOSIS — M25562 Pain in left knee: Secondary | ICD-10-CM | POA: Diagnosis not present

## 2018-10-15 DIAGNOSIS — Z1211 Encounter for screening for malignant neoplasm of colon: Secondary | ICD-10-CM | POA: Diagnosis not present

## 2018-10-15 DIAGNOSIS — Z79899 Other long term (current) drug therapy: Secondary | ICD-10-CM | POA: Diagnosis not present

## 2018-10-15 DIAGNOSIS — Z23 Encounter for immunization: Secondary | ICD-10-CM | POA: Diagnosis not present

## 2018-10-15 DIAGNOSIS — I69359 Hemiplegia and hemiparesis following cerebral infarction affecting unspecified side: Secondary | ICD-10-CM | POA: Diagnosis not present

## 2018-10-15 DIAGNOSIS — E559 Vitamin D deficiency, unspecified: Secondary | ICD-10-CM | POA: Diagnosis not present

## 2018-10-15 DIAGNOSIS — I10 Essential (primary) hypertension: Secondary | ICD-10-CM | POA: Diagnosis not present

## 2018-10-15 DIAGNOSIS — Z0000 Encounter for general adult medical examination without abnormal findings: Secondary | ICD-10-CM | POA: Diagnosis not present

## 2018-10-15 DIAGNOSIS — E785 Hyperlipidemia, unspecified: Secondary | ICD-10-CM | POA: Diagnosis not present

## 2018-10-15 DIAGNOSIS — L2081 Atopic neurodermatitis: Secondary | ICD-10-CM | POA: Diagnosis not present

## 2018-10-15 DIAGNOSIS — D692 Other nonthrombocytopenic purpura: Secondary | ICD-10-CM | POA: Diagnosis not present

## 2018-10-15 DIAGNOSIS — K219 Gastro-esophageal reflux disease without esophagitis: Secondary | ICD-10-CM | POA: Diagnosis not present

## 2018-10-15 DIAGNOSIS — B0229 Other postherpetic nervous system involvement: Secondary | ICD-10-CM | POA: Diagnosis not present

## 2018-12-02 DIAGNOSIS — R21 Rash and other nonspecific skin eruption: Secondary | ICD-10-CM | POA: Diagnosis not present

## 2018-12-28 DIAGNOSIS — R32 Unspecified urinary incontinence: Secondary | ICD-10-CM | POA: Diagnosis not present

## 2019-01-15 DIAGNOSIS — W19XXXA Unspecified fall, initial encounter: Secondary | ICD-10-CM | POA: Diagnosis not present

## 2019-01-15 DIAGNOSIS — L309 Dermatitis, unspecified: Secondary | ICD-10-CM | POA: Diagnosis not present

## 2019-01-25 DIAGNOSIS — L02511 Cutaneous abscess of right hand: Secondary | ICD-10-CM | POA: Diagnosis not present

## 2019-01-27 DIAGNOSIS — L089 Local infection of the skin and subcutaneous tissue, unspecified: Secondary | ICD-10-CM | POA: Diagnosis not present

## 2019-02-01 DIAGNOSIS — R32 Unspecified urinary incontinence: Secondary | ICD-10-CM | POA: Diagnosis not present

## 2019-02-10 DIAGNOSIS — L608 Other nail disorders: Secondary | ICD-10-CM | POA: Diagnosis not present

## 2019-02-10 DIAGNOSIS — L538 Other specified erythematous conditions: Secondary | ICD-10-CM | POA: Diagnosis not present

## 2019-02-10 DIAGNOSIS — L308 Other specified dermatitis: Secondary | ICD-10-CM | POA: Diagnosis not present

## 2019-02-25 DIAGNOSIS — L03019 Cellulitis of unspecified finger: Secondary | ICD-10-CM | POA: Diagnosis not present

## 2019-03-12 ENCOUNTER — Other Ambulatory Visit: Payer: Self-pay

## 2019-03-12 ENCOUNTER — Encounter (HOSPITAL_BASED_OUTPATIENT_CLINIC_OR_DEPARTMENT_OTHER): Payer: Medicare HMO | Attending: Internal Medicine

## 2019-03-12 ENCOUNTER — Other Ambulatory Visit (HOSPITAL_BASED_OUTPATIENT_CLINIC_OR_DEPARTMENT_OTHER): Payer: Self-pay | Admitting: Internal Medicine

## 2019-03-12 ENCOUNTER — Ambulatory Visit (HOSPITAL_BASED_OUTPATIENT_CLINIC_OR_DEPARTMENT_OTHER)
Admission: RE | Admit: 2019-03-12 | Discharge: 2019-03-12 | Disposition: A | Discharge status: Home / Self Care | Payer: Medicare HMO | Source: Ambulatory Visit | Attending: Internal Medicine | Admitting: Internal Medicine

## 2019-03-12 DIAGNOSIS — L98498 Non-pressure chronic ulcer of skin of other sites with other specified severity: Secondary | ICD-10-CM

## 2019-03-12 DIAGNOSIS — M069 Rheumatoid arthritis, unspecified: Secondary | ICD-10-CM | POA: Insufficient documentation

## 2019-03-12 DIAGNOSIS — L98492 Non-pressure chronic ulcer of skin of other sites with fat layer exposed: Secondary | ICD-10-CM | POA: Diagnosis not present

## 2019-03-12 DIAGNOSIS — Z87891 Personal history of nicotine dependence: Secondary | ICD-10-CM | POA: Diagnosis not present

## 2019-03-12 DIAGNOSIS — I10 Essential (primary) hypertension: Secondary | ICD-10-CM | POA: Diagnosis not present

## 2019-03-12 DIAGNOSIS — M06841 Other specified rheumatoid arthritis, right hand: Secondary | ICD-10-CM

## 2019-03-12 DIAGNOSIS — S61202A Unspecified open wound of right middle finger without damage to nail, initial encounter: Secondary | ICD-10-CM | POA: Diagnosis not present

## 2019-03-12 DIAGNOSIS — M19041 Primary osteoarthritis, right hand: Secondary | ICD-10-CM | POA: Diagnosis not present

## 2019-03-12 DIAGNOSIS — I69351 Hemiplegia and hemiparesis following cerebral infarction affecting right dominant side: Secondary | ICD-10-CM | POA: Insufficient documentation

## 2019-03-12 DIAGNOSIS — M7989 Other specified soft tissue disorders: Secondary | ICD-10-CM | POA: Diagnosis not present

## 2019-03-17 DIAGNOSIS — I10 Essential (primary) hypertension: Secondary | ICD-10-CM | POA: Diagnosis not present

## 2019-03-17 DIAGNOSIS — M79644 Pain in right finger(s): Secondary | ICD-10-CM | POA: Diagnosis not present

## 2019-03-17 DIAGNOSIS — S61202A Unspecified open wound of right middle finger without damage to nail, initial encounter: Secondary | ICD-10-CM | POA: Diagnosis not present

## 2019-03-17 DIAGNOSIS — L98492 Non-pressure chronic ulcer of skin of other sites with fat layer exposed: Secondary | ICD-10-CM | POA: Diagnosis not present

## 2019-03-17 DIAGNOSIS — I69351 Hemiplegia and hemiparesis following cerebral infarction affecting right dominant side: Secondary | ICD-10-CM | POA: Diagnosis not present

## 2019-03-17 DIAGNOSIS — Z87891 Personal history of nicotine dependence: Secondary | ICD-10-CM | POA: Diagnosis not present

## 2019-03-17 DIAGNOSIS — M069 Rheumatoid arthritis, unspecified: Secondary | ICD-10-CM | POA: Diagnosis not present

## 2019-03-25 DIAGNOSIS — S61202A Unspecified open wound of right middle finger without damage to nail, initial encounter: Secondary | ICD-10-CM | POA: Diagnosis not present

## 2019-03-25 DIAGNOSIS — I10 Essential (primary) hypertension: Secondary | ICD-10-CM | POA: Diagnosis not present

## 2019-03-25 DIAGNOSIS — Z87891 Personal history of nicotine dependence: Secondary | ICD-10-CM | POA: Diagnosis not present

## 2019-03-25 DIAGNOSIS — L98492 Non-pressure chronic ulcer of skin of other sites with fat layer exposed: Secondary | ICD-10-CM | POA: Diagnosis not present

## 2019-03-25 DIAGNOSIS — M069 Rheumatoid arthritis, unspecified: Secondary | ICD-10-CM | POA: Diagnosis not present

## 2019-03-25 DIAGNOSIS — I69351 Hemiplegia and hemiparesis following cerebral infarction affecting right dominant side: Secondary | ICD-10-CM | POA: Diagnosis not present

## 2019-04-01 ENCOUNTER — Encounter (HOSPITAL_BASED_OUTPATIENT_CLINIC_OR_DEPARTMENT_OTHER): Payer: Medicare HMO | Attending: Internal Medicine

## 2019-04-01 DIAGNOSIS — M06841 Other specified rheumatoid arthritis, right hand: Secondary | ICD-10-CM | POA: Diagnosis not present

## 2019-04-01 DIAGNOSIS — L98492 Non-pressure chronic ulcer of skin of other sites with fat layer exposed: Secondary | ICD-10-CM | POA: Diagnosis not present

## 2019-04-01 DIAGNOSIS — S61202A Unspecified open wound of right middle finger without damage to nail, initial encounter: Secondary | ICD-10-CM | POA: Diagnosis not present

## 2019-04-08 DIAGNOSIS — M06841 Other specified rheumatoid arthritis, right hand: Secondary | ICD-10-CM | POA: Diagnosis not present

## 2019-04-08 DIAGNOSIS — L409 Psoriasis, unspecified: Secondary | ICD-10-CM | POA: Diagnosis not present

## 2019-04-08 DIAGNOSIS — M86641 Other chronic osteomyelitis, right hand: Secondary | ICD-10-CM | POA: Diagnosis not present

## 2019-04-08 DIAGNOSIS — S61202A Unspecified open wound of right middle finger without damage to nail, initial encounter: Secondary | ICD-10-CM | POA: Diagnosis not present

## 2019-04-08 DIAGNOSIS — M06041 Rheumatoid arthritis without rheumatoid factor, right hand: Secondary | ICD-10-CM | POA: Diagnosis not present

## 2019-04-08 DIAGNOSIS — L98492 Non-pressure chronic ulcer of skin of other sites with fat layer exposed: Secondary | ICD-10-CM | POA: Diagnosis not present

## 2019-04-20 DIAGNOSIS — M2550 Pain in unspecified joint: Secondary | ICD-10-CM | POA: Diagnosis not present

## 2019-04-20 DIAGNOSIS — M21372 Foot drop, left foot: Secondary | ICD-10-CM | POA: Diagnosis not present

## 2019-04-20 DIAGNOSIS — Z8673 Personal history of transient ischemic attack (TIA), and cerebral infarction without residual deficits: Secondary | ICD-10-CM | POA: Diagnosis not present

## 2019-04-20 DIAGNOSIS — M79641 Pain in right hand: Secondary | ICD-10-CM | POA: Diagnosis not present

## 2019-04-20 DIAGNOSIS — M150 Primary generalized (osteo)arthritis: Secondary | ICD-10-CM | POA: Diagnosis not present

## 2019-04-20 DIAGNOSIS — M25511 Pain in right shoulder: Secondary | ICD-10-CM | POA: Diagnosis not present

## 2019-04-22 DIAGNOSIS — M86641 Other chronic osteomyelitis, right hand: Secondary | ICD-10-CM | POA: Diagnosis not present

## 2019-04-22 DIAGNOSIS — M06841 Other specified rheumatoid arthritis, right hand: Secondary | ICD-10-CM | POA: Diagnosis not present

## 2019-04-22 DIAGNOSIS — L98492 Non-pressure chronic ulcer of skin of other sites with fat layer exposed: Secondary | ICD-10-CM | POA: Diagnosis not present

## 2019-05-20 DIAGNOSIS — L4059 Other psoriatic arthropathy: Secondary | ICD-10-CM | POA: Diagnosis not present

## 2019-05-20 DIAGNOSIS — M21372 Foot drop, left foot: Secondary | ICD-10-CM | POA: Diagnosis not present

## 2019-05-20 DIAGNOSIS — M150 Primary generalized (osteo)arthritis: Secondary | ICD-10-CM | POA: Diagnosis not present

## 2019-05-20 DIAGNOSIS — M79641 Pain in right hand: Secondary | ICD-10-CM | POA: Diagnosis not present

## 2019-05-20 DIAGNOSIS — M25511 Pain in right shoulder: Secondary | ICD-10-CM | POA: Diagnosis not present

## 2019-05-20 DIAGNOSIS — M2550 Pain in unspecified joint: Secondary | ICD-10-CM | POA: Diagnosis not present

## 2019-05-20 DIAGNOSIS — Z8673 Personal history of transient ischemic attack (TIA), and cerebral infarction without residual deficits: Secondary | ICD-10-CM | POA: Diagnosis not present

## 2019-05-27 DIAGNOSIS — R32 Unspecified urinary incontinence: Secondary | ICD-10-CM | POA: Diagnosis not present

## 2019-05-27 DIAGNOSIS — I69359 Hemiplegia and hemiparesis following cerebral infarction affecting unspecified side: Secondary | ICD-10-CM | POA: Diagnosis not present

## 2019-06-26 DIAGNOSIS — R32 Unspecified urinary incontinence: Secondary | ICD-10-CM | POA: Diagnosis not present

## 2019-06-26 DIAGNOSIS — I69359 Hemiplegia and hemiparesis following cerebral infarction affecting unspecified side: Secondary | ICD-10-CM | POA: Diagnosis not present

## 2019-07-20 DIAGNOSIS — M21372 Foot drop, left foot: Secondary | ICD-10-CM | POA: Diagnosis not present

## 2019-07-27 DIAGNOSIS — R32 Unspecified urinary incontinence: Secondary | ICD-10-CM | POA: Diagnosis not present

## 2019-07-27 DIAGNOSIS — I69359 Hemiplegia and hemiparesis following cerebral infarction affecting unspecified side: Secondary | ICD-10-CM | POA: Diagnosis not present

## 2019-07-29 DIAGNOSIS — M79641 Pain in right hand: Secondary | ICD-10-CM | POA: Diagnosis not present

## 2019-07-29 DIAGNOSIS — Z8673 Personal history of transient ischemic attack (TIA), and cerebral infarction without residual deficits: Secondary | ICD-10-CM | POA: Diagnosis not present

## 2019-07-29 DIAGNOSIS — M25511 Pain in right shoulder: Secondary | ICD-10-CM | POA: Diagnosis not present

## 2019-07-29 DIAGNOSIS — M150 Primary generalized (osteo)arthritis: Secondary | ICD-10-CM | POA: Diagnosis not present

## 2019-07-29 DIAGNOSIS — M2550 Pain in unspecified joint: Secondary | ICD-10-CM | POA: Diagnosis not present

## 2019-07-29 DIAGNOSIS — M21372 Foot drop, left foot: Secondary | ICD-10-CM | POA: Diagnosis not present

## 2019-07-29 DIAGNOSIS — L4059 Other psoriatic arthropathy: Secondary | ICD-10-CM | POA: Diagnosis not present

## 2019-08-05 DIAGNOSIS — M25552 Pain in left hip: Secondary | ICD-10-CM | POA: Diagnosis not present

## 2019-08-05 DIAGNOSIS — M545 Low back pain: Secondary | ICD-10-CM | POA: Diagnosis not present

## 2019-08-26 DIAGNOSIS — I69359 Hemiplegia and hemiparesis following cerebral infarction affecting unspecified side: Secondary | ICD-10-CM | POA: Diagnosis not present

## 2019-08-26 DIAGNOSIS — R32 Unspecified urinary incontinence: Secondary | ICD-10-CM | POA: Diagnosis not present

## 2019-09-27 DIAGNOSIS — I69359 Hemiplegia and hemiparesis following cerebral infarction affecting unspecified side: Secondary | ICD-10-CM | POA: Diagnosis not present

## 2019-09-27 DIAGNOSIS — R32 Unspecified urinary incontinence: Secondary | ICD-10-CM | POA: Diagnosis not present

## 2019-10-28 DIAGNOSIS — R32 Unspecified urinary incontinence: Secondary | ICD-10-CM | POA: Diagnosis not present

## 2019-10-28 DIAGNOSIS — I69359 Hemiplegia and hemiparesis following cerebral infarction affecting unspecified side: Secondary | ICD-10-CM | POA: Diagnosis not present

## 2019-11-04 DIAGNOSIS — L4059 Other psoriatic arthropathy: Secondary | ICD-10-CM | POA: Diagnosis not present

## 2019-11-04 DIAGNOSIS — M21372 Foot drop, left foot: Secondary | ICD-10-CM | POA: Diagnosis not present

## 2019-11-04 DIAGNOSIS — M2550 Pain in unspecified joint: Secondary | ICD-10-CM | POA: Diagnosis not present

## 2019-11-04 DIAGNOSIS — M25511 Pain in right shoulder: Secondary | ICD-10-CM | POA: Diagnosis not present

## 2019-11-04 DIAGNOSIS — M150 Primary generalized (osteo)arthritis: Secondary | ICD-10-CM | POA: Diagnosis not present

## 2019-11-04 DIAGNOSIS — Z8673 Personal history of transient ischemic attack (TIA), and cerebral infarction without residual deficits: Secondary | ICD-10-CM | POA: Diagnosis not present

## 2019-11-04 DIAGNOSIS — M79641 Pain in right hand: Secondary | ICD-10-CM | POA: Diagnosis not present

## 2019-12-01 DIAGNOSIS — I69359 Hemiplegia and hemiparesis following cerebral infarction affecting unspecified side: Secondary | ICD-10-CM | POA: Diagnosis not present

## 2019-12-01 DIAGNOSIS — R32 Unspecified urinary incontinence: Secondary | ICD-10-CM | POA: Diagnosis not present

## 2019-12-02 DIAGNOSIS — L308 Other specified dermatitis: Secondary | ICD-10-CM | POA: Diagnosis not present

## 2019-12-02 DIAGNOSIS — I788 Other diseases of capillaries: Secondary | ICD-10-CM | POA: Diagnosis not present

## 2019-12-08 DIAGNOSIS — I69359 Hemiplegia and hemiparesis following cerebral infarction affecting unspecified side: Secondary | ICD-10-CM | POA: Diagnosis not present

## 2019-12-08 DIAGNOSIS — Z79899 Other long term (current) drug therapy: Secondary | ICD-10-CM | POA: Diagnosis not present

## 2019-12-08 DIAGNOSIS — J309 Allergic rhinitis, unspecified: Secondary | ICD-10-CM | POA: Diagnosis not present

## 2019-12-08 DIAGNOSIS — Z0000 Encounter for general adult medical examination without abnormal findings: Secondary | ICD-10-CM | POA: Diagnosis not present

## 2019-12-08 DIAGNOSIS — I10 Essential (primary) hypertension: Secondary | ICD-10-CM | POA: Diagnosis not present

## 2019-12-08 DIAGNOSIS — D692 Other nonthrombocytopenic purpura: Secondary | ICD-10-CM | POA: Diagnosis not present

## 2019-12-08 DIAGNOSIS — W19XXXA Unspecified fall, initial encounter: Secondary | ICD-10-CM | POA: Diagnosis not present

## 2019-12-08 DIAGNOSIS — E559 Vitamin D deficiency, unspecified: Secondary | ICD-10-CM | POA: Diagnosis not present

## 2019-12-08 DIAGNOSIS — E785 Hyperlipidemia, unspecified: Secondary | ICD-10-CM | POA: Diagnosis not present

## 2019-12-10 ENCOUNTER — Other Ambulatory Visit (HOSPITAL_BASED_OUTPATIENT_CLINIC_OR_DEPARTMENT_OTHER): Payer: Self-pay | Admitting: Family Medicine

## 2019-12-10 DIAGNOSIS — W19XXXA Unspecified fall, initial encounter: Secondary | ICD-10-CM

## 2019-12-11 ENCOUNTER — Emergency Department (HOSPITAL_BASED_OUTPATIENT_CLINIC_OR_DEPARTMENT_OTHER)
Admission: EM | Admit: 2019-12-11 | Discharge: 2019-12-11 | Disposition: A | Discharge status: Home / Self Care | Payer: Medicare HMO | Attending: Emergency Medicine | Admitting: Emergency Medicine

## 2019-12-11 ENCOUNTER — Other Ambulatory Visit: Payer: Self-pay

## 2019-12-11 ENCOUNTER — Emergency Department (HOSPITAL_BASED_OUTPATIENT_CLINIC_OR_DEPARTMENT_OTHER): Payer: Medicare HMO

## 2019-12-11 ENCOUNTER — Ambulatory Visit (HOSPITAL_BASED_OUTPATIENT_CLINIC_OR_DEPARTMENT_OTHER)
Admission: RE | Admit: 2019-12-11 | Discharge: 2019-12-11 | Disposition: A | Discharge status: Home / Self Care | Payer: Medicare HMO | Source: Ambulatory Visit | Attending: Family Medicine | Admitting: Family Medicine

## 2019-12-11 ENCOUNTER — Encounter (HOSPITAL_BASED_OUTPATIENT_CLINIC_OR_DEPARTMENT_OTHER): Payer: Self-pay | Admitting: Emergency Medicine

## 2019-12-11 ENCOUNTER — Encounter (INDEPENDENT_AMBULATORY_CARE_PROVIDER_SITE_OTHER): Payer: Self-pay

## 2019-12-11 DIAGNOSIS — S42412A Displaced simple supracondylar fracture without intercondylar fracture of left humerus, initial encounter for closed fracture: Secondary | ICD-10-CM | POA: Diagnosis not present

## 2019-12-11 DIAGNOSIS — Z79899 Other long term (current) drug therapy: Secondary | ICD-10-CM | POA: Diagnosis not present

## 2019-12-11 DIAGNOSIS — S4992XA Unspecified injury of left shoulder and upper arm, initial encounter: Secondary | ICD-10-CM | POA: Diagnosis present

## 2019-12-11 DIAGNOSIS — I69359 Hemiplegia and hemiparesis following cerebral infarction affecting unspecified side: Secondary | ICD-10-CM | POA: Diagnosis not present

## 2019-12-11 DIAGNOSIS — Z87891 Personal history of nicotine dependence: Secondary | ICD-10-CM | POA: Insufficient documentation

## 2019-12-11 DIAGNOSIS — S42422A Displaced comminuted supracondylar fracture without intercondylar fracture of left humerus, initial encounter for closed fracture: Secondary | ICD-10-CM | POA: Insufficient documentation

## 2019-12-11 DIAGNOSIS — W19XXXA Unspecified fall, initial encounter: Secondary | ICD-10-CM | POA: Diagnosis not present

## 2019-12-11 DIAGNOSIS — Z7982 Long term (current) use of aspirin: Secondary | ICD-10-CM | POA: Insufficient documentation

## 2019-12-11 DIAGNOSIS — Y929 Unspecified place or not applicable: Secondary | ICD-10-CM | POA: Insufficient documentation

## 2019-12-11 DIAGNOSIS — Y999 Unspecified external cause status: Secondary | ICD-10-CM | POA: Diagnosis not present

## 2019-12-11 DIAGNOSIS — I10 Essential (primary) hypertension: Secondary | ICD-10-CM | POA: Diagnosis not present

## 2019-12-11 DIAGNOSIS — S6992XA Unspecified injury of left wrist, hand and finger(s), initial encounter: Secondary | ICD-10-CM | POA: Diagnosis not present

## 2019-12-11 DIAGNOSIS — Y939 Activity, unspecified: Secondary | ICD-10-CM | POA: Insufficient documentation

## 2019-12-11 MED ORDER — OXYCODONE-ACETAMINOPHEN 5-325 MG PO TABS: 1.0000 | ORAL_TABLET | Freq: Four times a day (QID) | ORAL | 0 refills | Status: AC | PRN

## 2019-12-11 MED ORDER — OXYCODONE-ACETAMINOPHEN 5-325 MG PO TABS
1.0000 | ORAL_TABLET | Freq: Once | ORAL | Status: AC
  Administered 2019-12-11: 1 via ORAL
  Filled 2019-12-11: qty 1

## 2019-12-11 NOTE — ED Notes (Signed)
ED Provider at bedside. 

## 2019-12-11 NOTE — ED Provider Notes (Signed)
[place] EMERGENCY DEPARTMENT Provider Note   CSN: [id number] Arrival date & time: 12/11/19  1802     History Chief Complaint  Patient presents with  . Fall    Danny Lara is a 79 y.o. male.  He had a fall 4 days ago.  He had an injury to his left arm.  He had outpatient x-rays done today and was told to come back for a fracture.  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  He says he has a little bit of pain at the wrist.  Denies any other injury or complaints.  The history is provided by the patient.  Fall This is a new problem. The current episode started more than 2 days ago. The problem occurs rarely. The problem has not changed since onset.Pertinent negatives include no chest pain, no abdominal pain, no headaches and no shortness of breath. Exacerbated by: palpation. The symptoms are relieved by position. He has tried rest for the symptoms. The treatment provided mild relief.       Past Medical History:  Diagnosis Date  . Hypertension, essential   . Stroke [facility])     Patient Active Problem List   Diagnosis Date Noted  . Chest pain 09/14/2014  . HYPERTENSION 05/06/2007  . CEREBROVASCULAR ACCIDENT, HX OF 05/06/2007    Past Surgical History:  Procedure Laterality Date  . APPENDECTOMY    . boil removal Right 1997    behind ear  . EYE SURGERY Right 07-18-14       Family History  Problem Relation Age of Onset  . Colon cancer Brother   . Diabetes Brother   . CVA Daughter 45  . Aneurysm Daughter 55  . Tuberculosis Son   . Hypertension Daughter     Social History   Tobacco Use  . Smoking status: Former Research scientist (life sciences)  . Smokeless tobacco: Never Used  Substance Use Topics  . Alcohol use: No  . Drug use: No    Home Medications Prior to Admission medications   Medication Sig Start Date End Date Taking? Authorizing Provider  amLODipine (NORVASC) 10 MG tablet Take 10 mg by  mouth daily.    [provider]  aspirin 81 MG tablet Take 81 mg by mouth daily.    [provider]  atorvastatin (LIPITOR) 10 MG tablet Take 10 mg by mouth daily.    [provider]  b complex vitamins capsule Take 1 capsule by mouth daily.    [provider]  cholecalciferol (VITAMIN D) 1000 units tablet Take 2,000 Units by mouth daily.    [provider]  clobetasol ointment (TEMOVATE) [id number] % Apply 1 application topically 2 (two) times daily as needed.    [provider]  gabapentin (NEURONTIN) 100 MG capsule Take 100 mg by mouth 3 (three) times daily.    [provider]  omeprazole (PRILOSEC) 20 MG capsule Take 1 capsule (20 mg total) by mouth daily. 09/14/14   Dorothy Spark, MD  omeprazole (PRILOSEC) 20 MG capsule TAKE 1 CAPSULE BY MOUTH DAILY 06/01/15   Dorothy Spark, MD  potassium chloride SA (K-DUR,KLOR-CON) 20 MEQ tablet Take 10 mEq by mouth once.    [provider]  predniSONE (DELTASONE) 10 MG tablet Take 10 mg by mouth daily with breakfast.  10/26/14   [provider]  spironolactone (ALDACTONE) 25 MG tablet Take 25 mg by mouth  daily.    [provider]  triamcinolone cream (KENALOG) 0.1 % Apply 1 application topically 2 (two) times daily.    [provider]  valACYclovir (VALTREX) 1000 MG tablet Take 1 tablet (1,000 mg total) by mouth 3 (three) times daily. 05/09/18   Noe Gens, PA-C    Allergies    Patient has no known allergies.  Review of Systems   Review of Systems  Constitutional: Negative for fever.  Eyes: Negative for visual disturbance.  Respiratory: Negative for shortness of breath.   Cardiovascular: Negative for chest pain.  Gastrointestinal: Negative for abdominal pain.  Genitourinary: Negative for dysuria.  Musculoskeletal: Negative for neck pain.  Skin: Negative for wound.  Neurological: Negative for headaches.    Physical Exam Updated Vital  Signs BP 121/66 (BP Location: Right Arm)   Pulse 89   Temp 98.7 F (37.1 C) (Oral)   Resp 18   Wt 64 kg   SpO2 100%   BMI 24.22 kg/m   Physical Exam Vitals and nursing note reviewed.  Constitutional:      Appearance: He is well-developed.  HENT:     Head: Normocephalic and atraumatic.  Eyes:     Conjunctiva/sclera: Conjunctivae normal.  Cardiovascular:     Rate and Rhythm: Normal rate and regular rhythm.     Heart sounds: No murmur.  Pulmonary:     Effort: Pulmonary effort is normal. No respiratory distress.     Breath sounds: Normal breath sounds.  Abdominal:     Palpations: Abdomen is soft.     Tenderness: There is no abdominal tenderness.  Musculoskeletal:     Cervical back: Neck supple.     Comments: Nontender left shoulder.  Is tender around the left elbow.  Some vague tenderness at the left wrist although he has normal flexion extension and supination.  Radial pulse 2+.  Unable to assess nerve function due to prior paralysis.  Skin:    General: Skin is warm and dry.     Capillary Refill: Capillary refill takes less than 2 seconds.  Neurological:     Mental Status: He is alert. Mental status is at baseline.     Comments: He has baseline no use of his left arm.  He has an orthotic on his left leg.  He ambulates with a cane.  He denies any worsening neurologic function since the fall.     ED Results / Procedures / Treatments   Labs (all labs ordered are listed, but only abnormal results are displayed) Labs Reviewed - No data to display  EKG None  Radiology DG Elbow Complete Left  Result Date: 12/11/2019 CLINICAL DATA:  Fall, elbow pain, limited range of motion and pain EXAM: LEFT ELBOW - COMPLETE 3+ VIEW COMPARISON:  Forearm radiographs 12/11/2019 FINDINGS: Slight posterior displacement of a minimally comminuted supracondylar left humeral fracture. Additional minimally displaced fracture of the radial head likely with volar dislocation though difficult to fully  assess given suboptimal lateral positioning. Background of moderate degenerative changes in the elbow as and enthesopathic features upon the olecranon. Associated large elbow joint effusion and circumferential soft tissue swelling. IMPRESSION: Slight posterior displacement of a minimally comminuted supracondylar left humeral fracture. Additional minimally displaced fracture of the radial head likely with volar subluxation/dislocation though difficult to fully assess given suboptimal lateral radiograph positioning. Electronically Signed   By: Lovena Le M.D.   On: 12/11/2019 20:17   DG Forearm Left  Result Date: 12/11/2019 CLINICAL DATA:  79 year old male with fall. EXAM:  LEFT WRIST - 2 VIEW; LEFT FOREARM - 2 VIEW COMPARISON:  None. FINDINGS: Evaluation is limited due to patient positioning. There is a minimally displaced appearing supracondylar fracture of the distal humerus, possibly comminuted. No definite dislocation at the elbow. There is slight dorsal positioning of the distal ulna in relation to the carpal bones which may be chronic or represent a degree of subluxation. Clinical correlation is recommended. There is associated positive ulnar variance. The bones are osteopenic. The soft tissues are grossly unremarkable. IMPRESSION: 1. Mildly displaced supracondylar fracture. 2. Chronic dorsal positioning of the distal ulna versus mild subluxation at the wrist. Clinical correlation is recommended. Electronically Signed   By: Anner Crete M.D.   On: 12/11/2019 15:43   DG Wrist 2 Views Left  Result Date: 12/11/2019 CLINICAL DATA:  79 year old male with fall. EXAM: LEFT WRIST - 2 VIEW; LEFT FOREARM - 2 VIEW COMPARISON:  None. FINDINGS: Evaluation is limited due to patient positioning. There is a minimally displaced appearing supracondylar fracture of the distal humerus, possibly comminuted. No definite dislocation at the elbow. There is slight dorsal positioning of the distal ulna in relation to the  carpal bones which may be chronic or represent a degree of subluxation. Clinical correlation is recommended. There is associated positive ulnar variance. The bones are osteopenic. The soft tissues are grossly unremarkable. IMPRESSION: 1. Mildly displaced supracondylar fracture. 2. Chronic dorsal positioning of the distal ulna versus mild subluxation at the wrist. Clinical correlation is recommended. Electronically Signed   By: Anner Crete M.D.   On: 12/11/2019 15:43    Procedures Procedures (including critical care time)  Medications Ordered in ED Medications  oxyCODONE-acetaminophen (PERCOCET/ROXICET) 5-325 MG per tablet 1 tablet (1 tablet Oral Given 12/11/19 1948)  oxyCODONE-acetaminophen (PERCOCET/ROXICET) 5-325 MG per tablet 1 tablet (1 tablet Oral Given 12/11/19 2055)    ED Course  I have reviewed the triage vital signs and the nursing notes.  Pertinent labs & imaging results that were available during my care of the patient were reviewed by me and considered in my medical decision making (see chart for details).  Clinical Course as of Dec 11 1154  Sat Dec 11, 2019  2045 Patient here with outpatient x-ray calling fractured elbow.  He said he had a mechanical fall.  Bruising on the inner elbow.  Difficult to assess range of motion due to his paralysis.  Reviewed with Dr. [facility] who recommends posterior long-arm splint and follow-up with him in 3 to 4 days.   [MB]  2105 Posterior long-arm splint applied by ED Tech.  Was there for assistance with.  Cap refill brisk after application.   [MB]    Clinical Course User Index [MB] Hayden Rasmussen, MD   MDM Rules/Calculators/A&P                       Final Clinical Impression(s) / ED Diagnoses Final diagnoses:  Closed supracondylar fracture of left humerus, initial encounter  Fall, initial encounter    Rx / DC Orders ED Discharge Orders         Ordered    oxyCODONE-acetaminophen (PERCOCET/ROXICET) 5-325 MG tablet   Every 6 hours PRN     12/11/19 2107           Hayden Rasmussen, MD 12/12/19 1156

## 2019-12-11 NOTE — ED Notes (Signed)
Patient transported to X-ray 

## 2019-12-11 NOTE — ED Notes (Signed)
Pt reports 200 mg 2 hr pta.

## 2019-12-11 NOTE — ED Triage Notes (Signed)
Pt states that he feel tues. He was here earlier for an outpt x-ray  - was told to come back  - pt has a fracture to the left elbow

## 2019-12-11 NOTE — Discharge Instructions (Signed)
You were evaluated in the emergency department for injuries to your left arm from a fall.  You had a fracture around the elbow that was splinted and will need to be followed up with orthopedics.  We are prescribing you some pain medicine to use as needed although please be careful as it can be sedating and also cause constipation.

## 2019-12-14 ENCOUNTER — Ambulatory Visit (INDEPENDENT_AMBULATORY_CARE_PROVIDER_SITE_OTHER): Payer: Medicare HMO | Admitting: Orthopaedic Surgery

## 2019-12-14 ENCOUNTER — Other Ambulatory Visit: Payer: Self-pay

## 2019-12-14 ENCOUNTER — Encounter: Payer: Self-pay | Admitting: Orthopaedic Surgery

## 2019-12-14 DIAGNOSIS — S42493A Other displaced fracture of lower end of unspecified humerus, initial encounter for closed fracture: Secondary | ICD-10-CM | POA: Insufficient documentation

## 2019-12-14 DIAGNOSIS — E559 Vitamin D deficiency, unspecified: Secondary | ICD-10-CM | POA: Diagnosis not present

## 2019-12-14 DIAGNOSIS — S42492A Other displaced fracture of lower end of left humerus, initial encounter for closed fracture: Secondary | ICD-10-CM | POA: Diagnosis not present

## 2019-12-14 DIAGNOSIS — Z79899 Other long term (current) drug therapy: Secondary | ICD-10-CM | POA: Diagnosis not present

## 2019-12-14 DIAGNOSIS — E785 Hyperlipidemia, unspecified: Secondary | ICD-10-CM | POA: Diagnosis not present

## 2019-12-14 NOTE — Progress Notes (Signed)
Office Visit Note   Patient: Danny Lara           Date of Birth: 01-21-1941           MRN: [id number] Visit Date: 12/14/2019              Requested by: No referring provider defined for this encounter. PCP: Patient, No Pcp Per   Assessment & Plan: Visit Diagnoses:  1. Closed bicondylar fracture of distal end of left humerus, initial encounter     Plan: We adjusted the sling fasting and just around his neck since his bothering his left brachial plexus.  Loop made around his head and sling was used for cuff.  Recheck 2weeks.  Plan will be to change his posterior splint versus long-arm cast application.  Follow-Up Instructions: No follow-ups on file.   Orders:  No orders of the defined types were placed in this encounter.  No orders of the defined types were placed in this encounter.     Procedures: No procedures performed   Clinical Data: No additional findings.   Subjective: Chief Complaint  Patient presents with  . Left Elbow - Fracture    DOI 12/07/2019    HPI 79 year old male from the [place] is here with his daughter with history of a previous CVA with left-sided weakness.  He normally ambulates with a cane and had a fall with a left supracondylar humerus fracture.  He is in a posterior splint date of injury is 12/07/2019.  He took 1 oxycodone he states it made him dizzy has had some itching now switched to ibuprofen and Tylenol and states the supracondylar fracture on the left is really not terribly painful.  Review of Systems positive for hypertension previous CVA and the above fracture otherwise negative as pertains HPI.   Objective: Vital Signs: Ht 5\' 4"  (1.626 m)   Wt 137 lb (62.1 kg)   BMI 23.52 kg/m   Physical Exam Constitutional:      Comments: Patient is in a wheelchair he has flaccid left upper extremity.  HENT:     Head: Normocephalic and atraumatic.     Right Ear: External ear normal.     Left Ear: External ear normal.    Nose: Nose normal.     Mouth/Throat:     Mouth: Mucous membranes are moist.  Eyes:     Extraocular Movements: Extraocular movements intact.     Pupils: Pupils are equal, round, and reactive to light.  Cardiovascular:     Rate and Rhythm: Normal rate.  Pulmonary:     Effort: Pulmonary effort is normal.     Breath sounds: Wheezing present.  Musculoskeletal:     Cervical back: Normal range of motion.  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        Thought Content: Thought content normal.     Ortho Exam patient is in a well-padded posterior splint.  Elbows 70 degrees flexion and sling rides up on his elbow.  Specialty Comments:  No specialty comments available.  Imaging: No results found.   PMFS History: Patient Active Problem List   Diagnosis Date Noted  . Closed bicondylar fracture of distal humerus 12/14/2019  . Chest pain 09/14/2014  . HYPERTENSION 05/06/2007  . CEREBROVASCULAR ACCIDENT, HX OF 05/06/2007   Past Medical History:  Diagnosis Date  . Hypertension, essential   . Stroke ([place])  Family History  Problem Relation Age of Onset  . Colon cancer Brother   . Diabetes Brother   . CVA Daughter 88  . Aneurysm Daughter 54  . Tuberculosis Son   . Hypertension Daughter     Past Surgical History:  Procedure Laterality Date  . APPENDECTOMY    . boil removal Right 1997    behind ear  . EYE SURGERY Right 07-18-14   Social History   Occupational History  . Not on file  Tobacco Use  . Smoking status: Former Research scientist (life sciences)  . Smokeless tobacco: Never Used  Substance and Sexual Activity  . Alcohol use: No  . Drug use: No  . Sexual activity: Not on file

## 2019-12-22 ENCOUNTER — Telehealth: Payer: Self-pay

## 2019-12-22 NOTE — Telephone Encounter (Signed)
Patient's daughter called concerning some dark bruising that patient has on his left hand, palm and wrist area.  Would like a call back to discuss.  CB# [phone].  Please advise.  Thank you.

## 2019-12-22 NOTE — Telephone Encounter (Signed)
Could you please call patient's daughter?

## 2019-12-23 NOTE — Telephone Encounter (Signed)
I called and left message

## 2019-12-28 ENCOUNTER — Ambulatory Visit (INDEPENDENT_AMBULATORY_CARE_PROVIDER_SITE_OTHER): Payer: Medicare HMO | Admitting: Orthopaedic Surgery

## 2019-12-28 ENCOUNTER — Encounter: Payer: Self-pay | Admitting: Orthopaedic Surgery

## 2019-12-28 ENCOUNTER — Other Ambulatory Visit: Payer: Self-pay

## 2019-12-28 VITALS — Ht 64.0 in | Wt 137.0 lb

## 2019-12-28 DIAGNOSIS — S42492A Other displaced fracture of lower end of left humerus, initial encounter for closed fracture: Secondary | ICD-10-CM

## 2019-12-28 NOTE — Progress Notes (Signed)
   Office Visit Note   Patient: Danny Lara           Date of Birth: 1941-01-06           MRN: [id number] Visit Date: 12/28/2019              Requested by: No referring provider defined for this encounter. PCP: Patient, No Pcp Per   Assessment & Plan: Visit Diagnoses:  1. Closed bicondylar fracture of distal end of left humerus, initial encounter     Plan: Skin check he has some ecchymosis but no areas of skin breakdown.  Danny Lara and resplinted.  Return 3 weeks repeat x-ray on return and splint check leaving the splint on.  Follow-Up Instructions: Return in about 3 weeks (around 01/18/2020).   Orders:  No orders of the defined types were placed in this encounter.  No orders of the defined types were placed in this encounter.     Procedures: No procedures performed   Clinical Data: No additional findings.   Subjective: Chief Complaint  Patient presents with  . Left Elbow - Fracture, Follow-up    DOI 12/07/2019    HPI follow-up left closed bicondylar distal humerus fracture with previous CVA and flaccid left upper extremity.  He returns for skin check skin looks good he has some ecchymosis as expected.  He is taking some Tylenol and ibuprofen for the pain with relief.   Review of Systems unchanged   Objective: Vital Signs: Ht 5\' 4"  (1.626 m)   Wt 137 lb (62.1 kg)   BMI 23.52 kg/m   Physical Exam no change.  Ortho Exam Some ecchymosis over the volar wrist.  No skin breakdown posteriorly.  New stockinette new padding applied.  Continue splinting.   Specialty Comments:  No specialty comments available.  Imaging: No results found.   PMFS History: Patient Active Problem List   Diagnosis Date Noted  . Closed bicondylar fracture of distal humerus 12/14/2019  . Chest pain 09/14/2014  . HYPERTENSION 05/06/2007  . CEREBROVASCULAR ACCIDENT, HX OF 05/06/2007   Past Medical History:  Diagnosis Date  . Hypertension, essential   . Stroke [facility])       Family History  Problem Relation Age of Onset  . Colon cancer Brother   . Diabetes Brother   . CVA Daughter 69  . Aneurysm Daughter 62  . Tuberculosis Son   . Hypertension Daughter     Past Surgical History:  Procedure Laterality Date  . APPENDECTOMY    . boil removal Right 1997    behind ear  . EYE SURGERY Right 07-18-14   Social History   Occupational History  . Not on file  Tobacco Use  . Smoking status: Former Research scientist (life sciences)  . Smokeless tobacco: Never Used  Substance and Sexual Activity  . Alcohol use: No  . Drug use: No  . Sexual activity: Not on file

## 2020-01-18 ENCOUNTER — Encounter: Payer: Self-pay | Admitting: Orthopaedic Surgery

## 2020-01-18 ENCOUNTER — Ambulatory Visit (INDEPENDENT_AMBULATORY_CARE_PROVIDER_SITE_OTHER): Payer: Medicare HMO | Admitting: Orthopaedic Surgery

## 2020-01-18 ENCOUNTER — Other Ambulatory Visit: Payer: Self-pay

## 2020-01-18 ENCOUNTER — Ambulatory Visit (INDEPENDENT_AMBULATORY_CARE_PROVIDER_SITE_OTHER): Payer: Medicare HMO

## 2020-01-18 VITALS — Ht 64.0 in | Wt 137.0 lb

## 2020-01-18 DIAGNOSIS — S42492A Other displaced fracture of lower end of left humerus, initial encounter for closed fracture: Secondary | ICD-10-CM | POA: Diagnosis not present

## 2020-01-18 NOTE — Progress Notes (Signed)
Office Visit Note   Patient: Danny Lara           Date of Birth: Mar 04, 1941           MRN: [id number] Visit Date: 01/18/2020              Requested by: No referring provider defined for this encounter. PCP: Patient, No Pcp Per   Assessment & Plan: Visit Diagnoses:  1. Closed bicondylar fracture of distal end of left humerus, initial encounter     Plan: Splint removed.  No pain with elbow 3040 degrees flexion extension collateral ligament stressing is not painful it appears that his fracture is a least partially healed.  Splint reapplied he can take it off in 2 weeks.  I plan to see him back in 1 month For final AP lateral elbow x-rays out of his splint on return.  He only has to wear the splint for 2 more weeks and should be out of the splint when he comes back to the office. Follow-Up Instructions: No follow-ups on file.   Orders:  Orders Placed This Encounter  Procedures  . XR Elbow 2 Views Left   No orders of the defined types were placed in this encounter.     Procedures: No procedures performed   Clinical Data: No additional findings.   Subjective: Chief Complaint  Patient presents with  . 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  He has an AFO on his leg and his left upper extremity was flaccid.  He has been in the splint and x-rays were obtained today.  Review of Systems updated unchanged.   Objective: Vital Signs: Ht 5\' 4"  (1.626 m)   Wt 137 lb (62.1 kg)   BMI 23.52 kg/m   Physical Exam Constitutional:      Appearance: He is well-developed.  HENT:     Head: Normocephalic and atraumatic.  Eyes:     Pupils: Pupils are equal, round, and reactive to light.  Neck:     Thyroid: No thyromegaly.     Trachea: No tracheal deviation.  Cardiovascular:     Rate and Rhythm: Normal rate.    Pulmonary:     Effort: Pulmonary effort is normal.     Breath sounds: No wheezing.  Abdominal:     General: Bowel sounds are normal.     Palpations: Abdomen is soft.  Skin:    General: Skin is warm and dry.     Capillary Refill: Capillary refill takes less than 2 seconds.  Neurological:     Mental Status: He is alert and oriented to person, place, and time.  Psychiatric:        Behavior: Behavior normal.        Thought Content: Thought content normal.        Judgment: Judgment normal.     Ortho Exam flaccid left upper extremity with flexion contracture of the digits.  No motion at the fracture site with varus valgus stress is noted.  He has 30 to 40 degrees passive flexion extension without significant pain.  He has some contractures I did not stress that any further based on findings on his x-ray.  Splint reapplied.  Specialty Comments:  No specialty comments available.  Imaging: XR Elbow 2 Views Left  Result Date: 01/18/2020 2 view x-rays and splint left elbow demonstrates supracondylar  fracture with some periosteal callus formation.  Radial head fracture noted.  With the images that he still may have some radial head subluxation. Impression: Follow-up left supracondylar humerus fracture unchanged position and alignment with some interval healing.    PMFS History: Patient Active Problem List   Diagnosis Date Noted  . Closed bicondylar fracture of distal humerus 12/14/2019  . Chest pain 09/14/2014  . HYPERTENSION 05/06/2007  . CEREBROVASCULAR ACCIDENT, HX OF 05/06/2007   Past Medical History:  Diagnosis Date  . Hypertension, essential   . Stroke [facility])     Family History  Problem Relation Age of Onset  . Colon cancer Brother   . Diabetes Brother   . CVA Daughter 4  . Aneurysm Daughter 80  . Tuberculosis Son   . Hypertension Daughter     Past Surgical History:  Procedure Laterality Date  . APPENDECTOMY    . boil removal Right 1997    behind ear  . EYE  SURGERY Right 07-18-14   Social History   Occupational History  . Not on file  Tobacco Use  . Smoking status: Former Research scientist (life sciences)  . Smokeless tobacco: Never Used  Substance and Sexual Activity  . Alcohol use: No  . Drug use: No  . Sexual activity: Not on file

## 2020-02-02 DIAGNOSIS — M25511 Pain in right shoulder: Secondary | ICD-10-CM | POA: Diagnosis not present

## 2020-02-02 DIAGNOSIS — Z8673 Personal history of transient ischemic attack (TIA), and cerebral infarction without residual deficits: Secondary | ICD-10-CM | POA: Diagnosis not present

## 2020-02-02 DIAGNOSIS — L4059 Other psoriatic arthropathy: Secondary | ICD-10-CM | POA: Diagnosis not present

## 2020-02-02 DIAGNOSIS — M150 Primary generalized (osteo)arthritis: Secondary | ICD-10-CM | POA: Diagnosis not present

## 2020-02-02 DIAGNOSIS — M79641 Pain in right hand: Secondary | ICD-10-CM | POA: Diagnosis not present

## 2020-02-02 DIAGNOSIS — M21372 Foot drop, left foot: Secondary | ICD-10-CM | POA: Diagnosis not present

## 2020-02-16 ENCOUNTER — Other Ambulatory Visit: Payer: Self-pay

## 2020-02-16 ENCOUNTER — Encounter: Payer: Self-pay | Admitting: Orthopaedic Surgery

## 2020-02-16 ENCOUNTER — Ambulatory Visit (INDEPENDENT_AMBULATORY_CARE_PROVIDER_SITE_OTHER): Payer: Medicare HMO

## 2020-02-16 ENCOUNTER — Ambulatory Visit (INDEPENDENT_AMBULATORY_CARE_PROVIDER_SITE_OTHER): Payer: Medicare HMO | Admitting: Orthopaedic Surgery

## 2020-02-16 VITALS — Ht 64.0 in | Wt 137.0 lb

## 2020-02-16 DIAGNOSIS — S42492A Other displaced fracture of lower end of left humerus, initial encounter for closed fracture: Secondary | ICD-10-CM

## 2020-02-21 NOTE — Progress Notes (Signed)
Office Visit Note   Patient: Danny Lara           Date of Birth: [birth year]           MRN: [id number] Visit Date: 02/16/2020              Requested by: No referring provider defined for this encounter. PCP: Patient, No Pcp Per   Assessment & Plan: Visit Diagnoses:  1. Closed bicondylar fracture of distal end of left humerus, initial encounter     Plan: Patient has interval healing of his essentially nondisplaced supracondylar humerus fracture.  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  He is released from care he can use a sling intermittently and fall prevention was discussed.  Return as needed.  Follow-Up Instructions: Return if symptoms worsen or fail to improve.   Orders:  Orders Placed This Encounter  Procedures  . XR Elbow 2 Views Left   No orders of the defined types were placed in this encounter.     Procedures: No procedures performed   Clinical Data: No additional findings.   Subjective: Chief Complaint  Patient presents with  . Left Elbow - Fracture, Follow-up    DOI 12/07/2019    HPI 79 year old male returns for follow-up of supracondylar humerus fracture minimally displaced and a flaccid left upper extremity due to old CVA.  Date of injury is 12/07/2019 with a fall.  He denies pain swelling is down.  Review of Systems unchanged from last office visit.   Objective: Vital Signs: Ht 5\' 4"  (1.626 m)   Wt 137 lb (62.1 kg)   BMI 23.52 kg/m   Physical Exam Constitutional:      Appearance: He is well-developed.  HENT:     Head: Normocephalic and atraumatic.  Eyes:     Pupils: Pupils are equal, round, and reactive to light.  Neck:     Thyroid: No thyromegaly.     Trachea: No tracheal deviation.  Cardiovascular:     Rate and Rhythm: Normal rate.  Pulmonary:     Effort: Pulmonary effort is normal.     Breath sounds: No wheezing.  Abdominal:     General: Bowel sounds are normal.     Palpations: Abdomen is  soft.  Skin:    General: Skin is warm and dry.     Capillary Refill: Capillary refill takes less than 2 seconds.  Neurological:     Mental Status: He is alert and oriented to person, place, and time.  Psychiatric:        Behavior: Behavior normal.        Thought Content: Thought content normal.        Judgment: Judgment normal.     Ortho Exam left upper extremity is flaccid.  Elbows in extension he has some flexion contractures of the digits.  No tenderness at the distal humerus site.  Mild swelling of the hand is noted.  No ecchymosis.  Opposite right upper extremity shows good flexion extension normal grip normal supination pronation.  Specialty Comments:  No specialty comments available.  Imaging: No results found.   PMFS History: Patient Active Problem List   Diagnosis Date Noted  . Closed bicondylar fracture of distal humerus 12/14/2019  . Chest pain 09/14/2014  . HYPERTENSION 05/06/2007  . CEREBROVASCULAR ACCIDENT, HX OF 05/06/2007   Past Medical History:  Diagnosis Date  . Hypertension, essential   . Stroke [facility])     Family History  Problem Relation Age of Onset  .  Colon cancer Brother   . Diabetes Brother   . CVA Daughter 55  . Aneurysm Daughter 23  . Tuberculosis Son   . Hypertension Daughter     Past Surgical History:  Procedure Laterality Date  . APPENDECTOMY    . boil removal Right 1997    behind ear  . EYE SURGERY Right 07-18-14   Social History   Occupational History  . Not on file  Tobacco Use  . Smoking status: Former Research scientist (life sciences)  . Smokeless tobacco: Never Used  Substance and Sexual Activity  . Alcohol use: No  . Drug use: No  . Sexual activity: Not on file

## 2020-04-01 IMAGING — DX DG ELBOW COMPLETE 3+V*L*
3 series · 3 of 3 positions shown · non-contrast
Comparison: Forearm radiographs 12/11/2019

CLINICAL DATA: Fall, elbow pain, limited range of motion and pain

EXAM:
LEFT ELBOW - COMPLETE 3+ VIEW

[elbow ap]
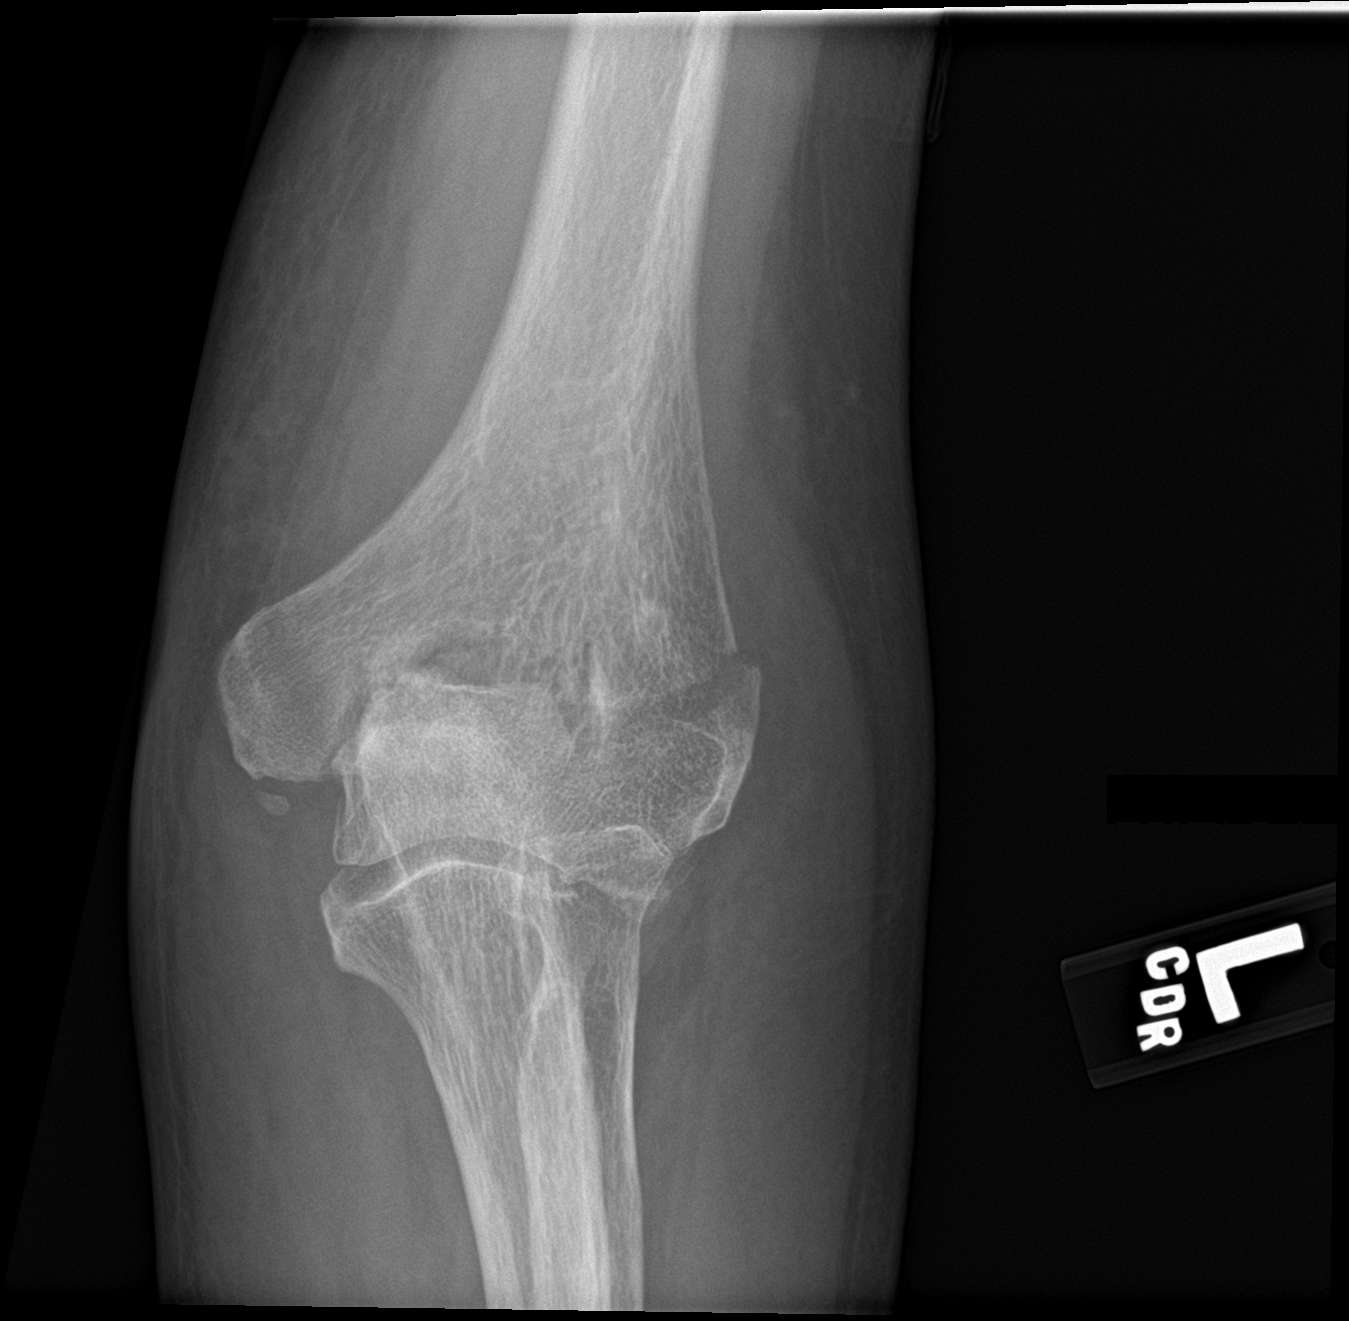

[elbow obl]
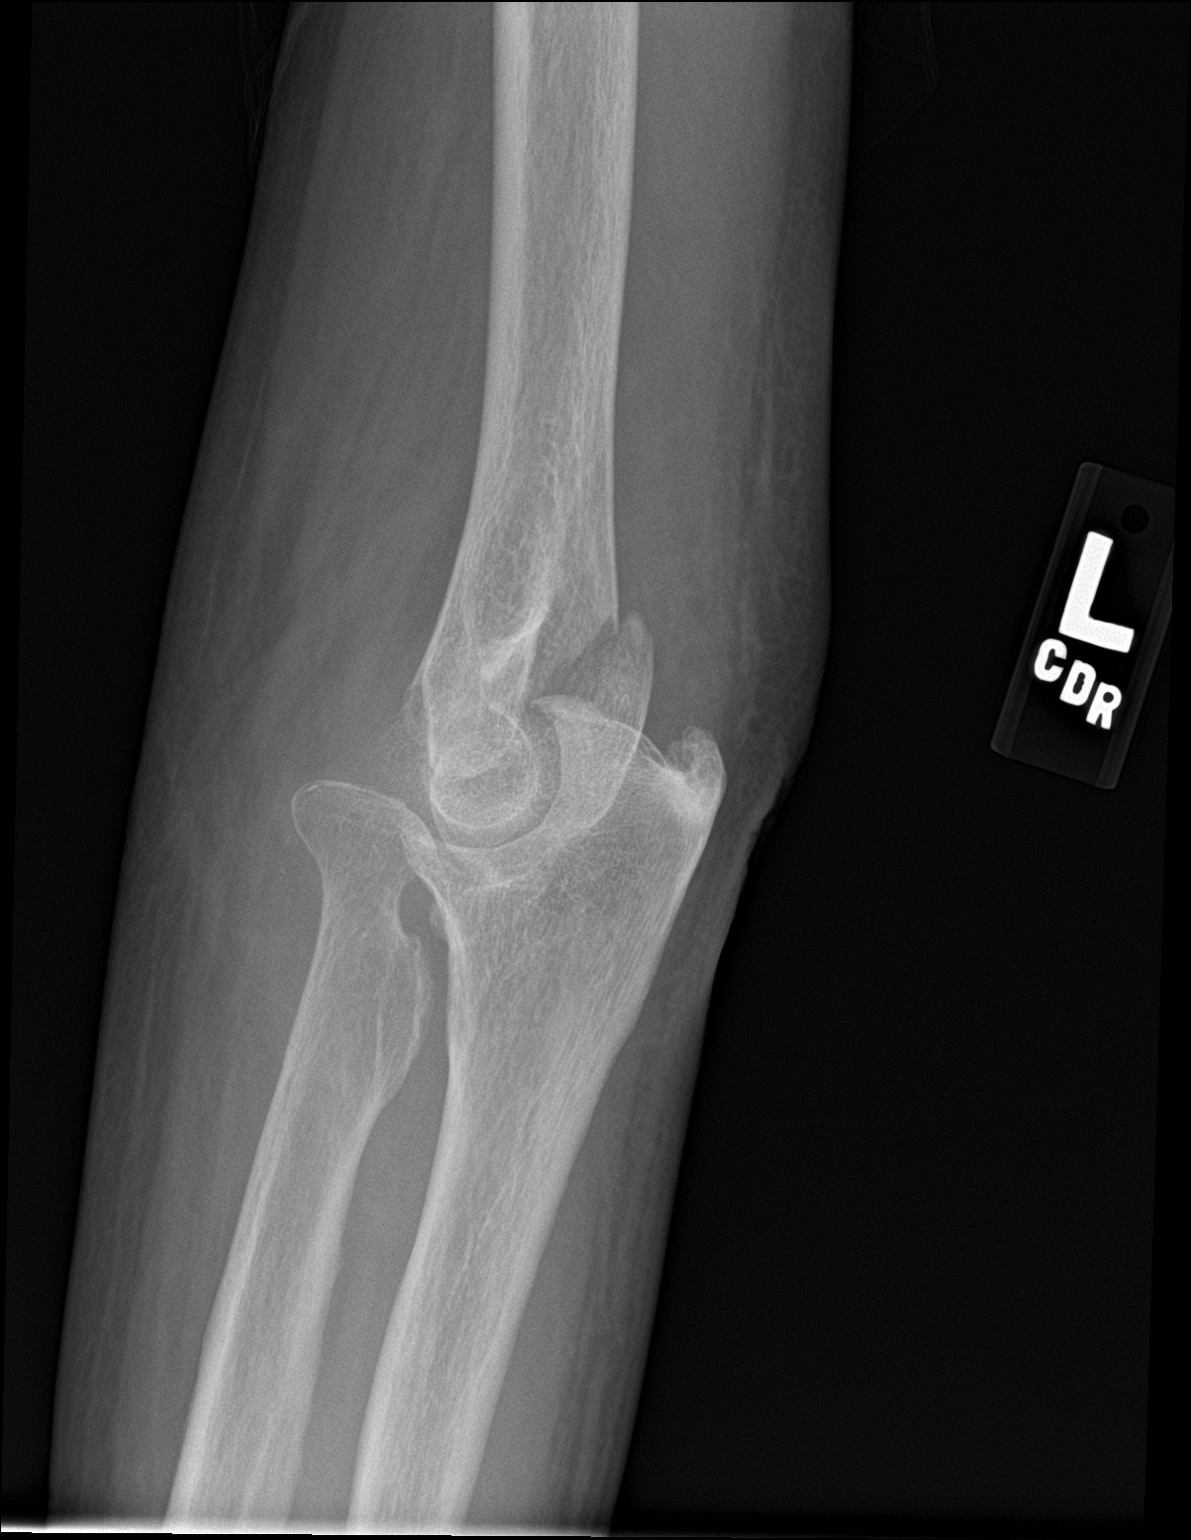

[elbow lat]
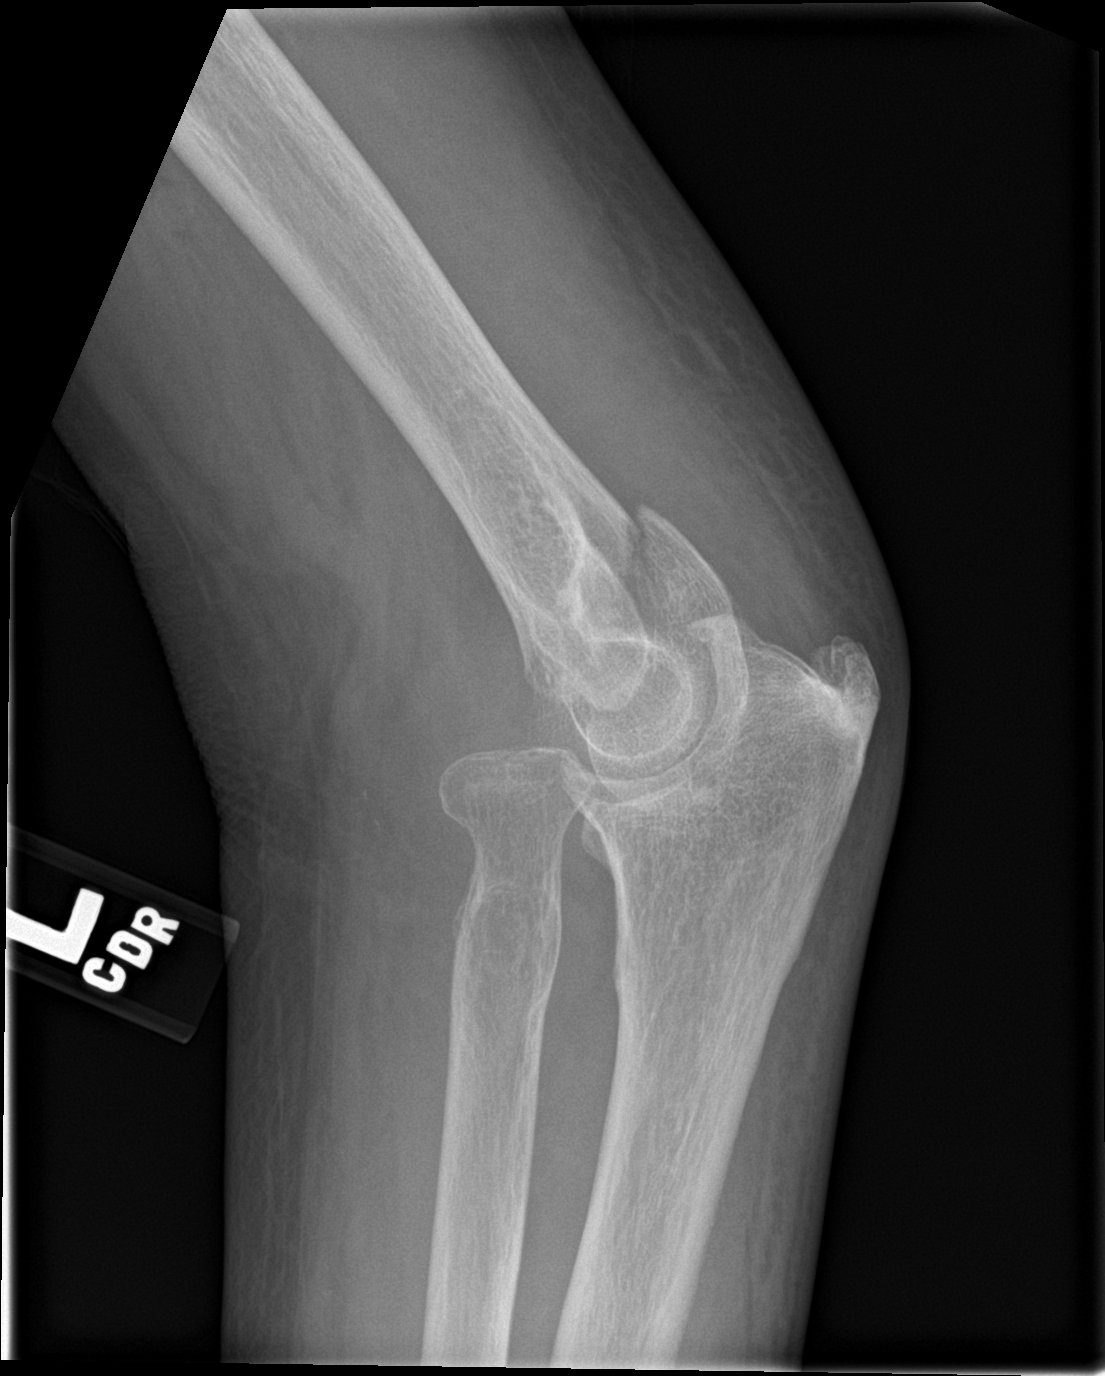

[3 of 3 positions shown; findings below may reference images not displayed]

FINDINGS: Slight posterior displacement of a minimally comminuted
supracondylar left humeral fracture. Additional minimally displaced
fracture of the radial head likely with volar dislocation though
difficult to fully assess given suboptimal lateral positioning.
Background of moderate degenerative changes in the elbow as and
enthesopathic features upon the olecranon. Associated large elbow
joint effusion and circumferential soft tissue swelling.
IMPRESSION: Slight posterior displacement of a minimally comminuted
supracondylar left humeral fracture.

Additional minimally displaced fracture of the radial head likely
with volar subluxation/dislocation though difficult to fully assess
given suboptimal lateral radiograph positioning.

## 2020-04-01 IMAGING — CR DG FOREARM 2V*L*
2 series · 2 of 2 positions shown · non-contrast
Comparison: None.

CLINICAL DATA: 78-year-old male with fall.

EXAM:
LEFT WRIST - 2 VIEW; LEFT FOREARM - 2 VIEW

[x forearm lat left]
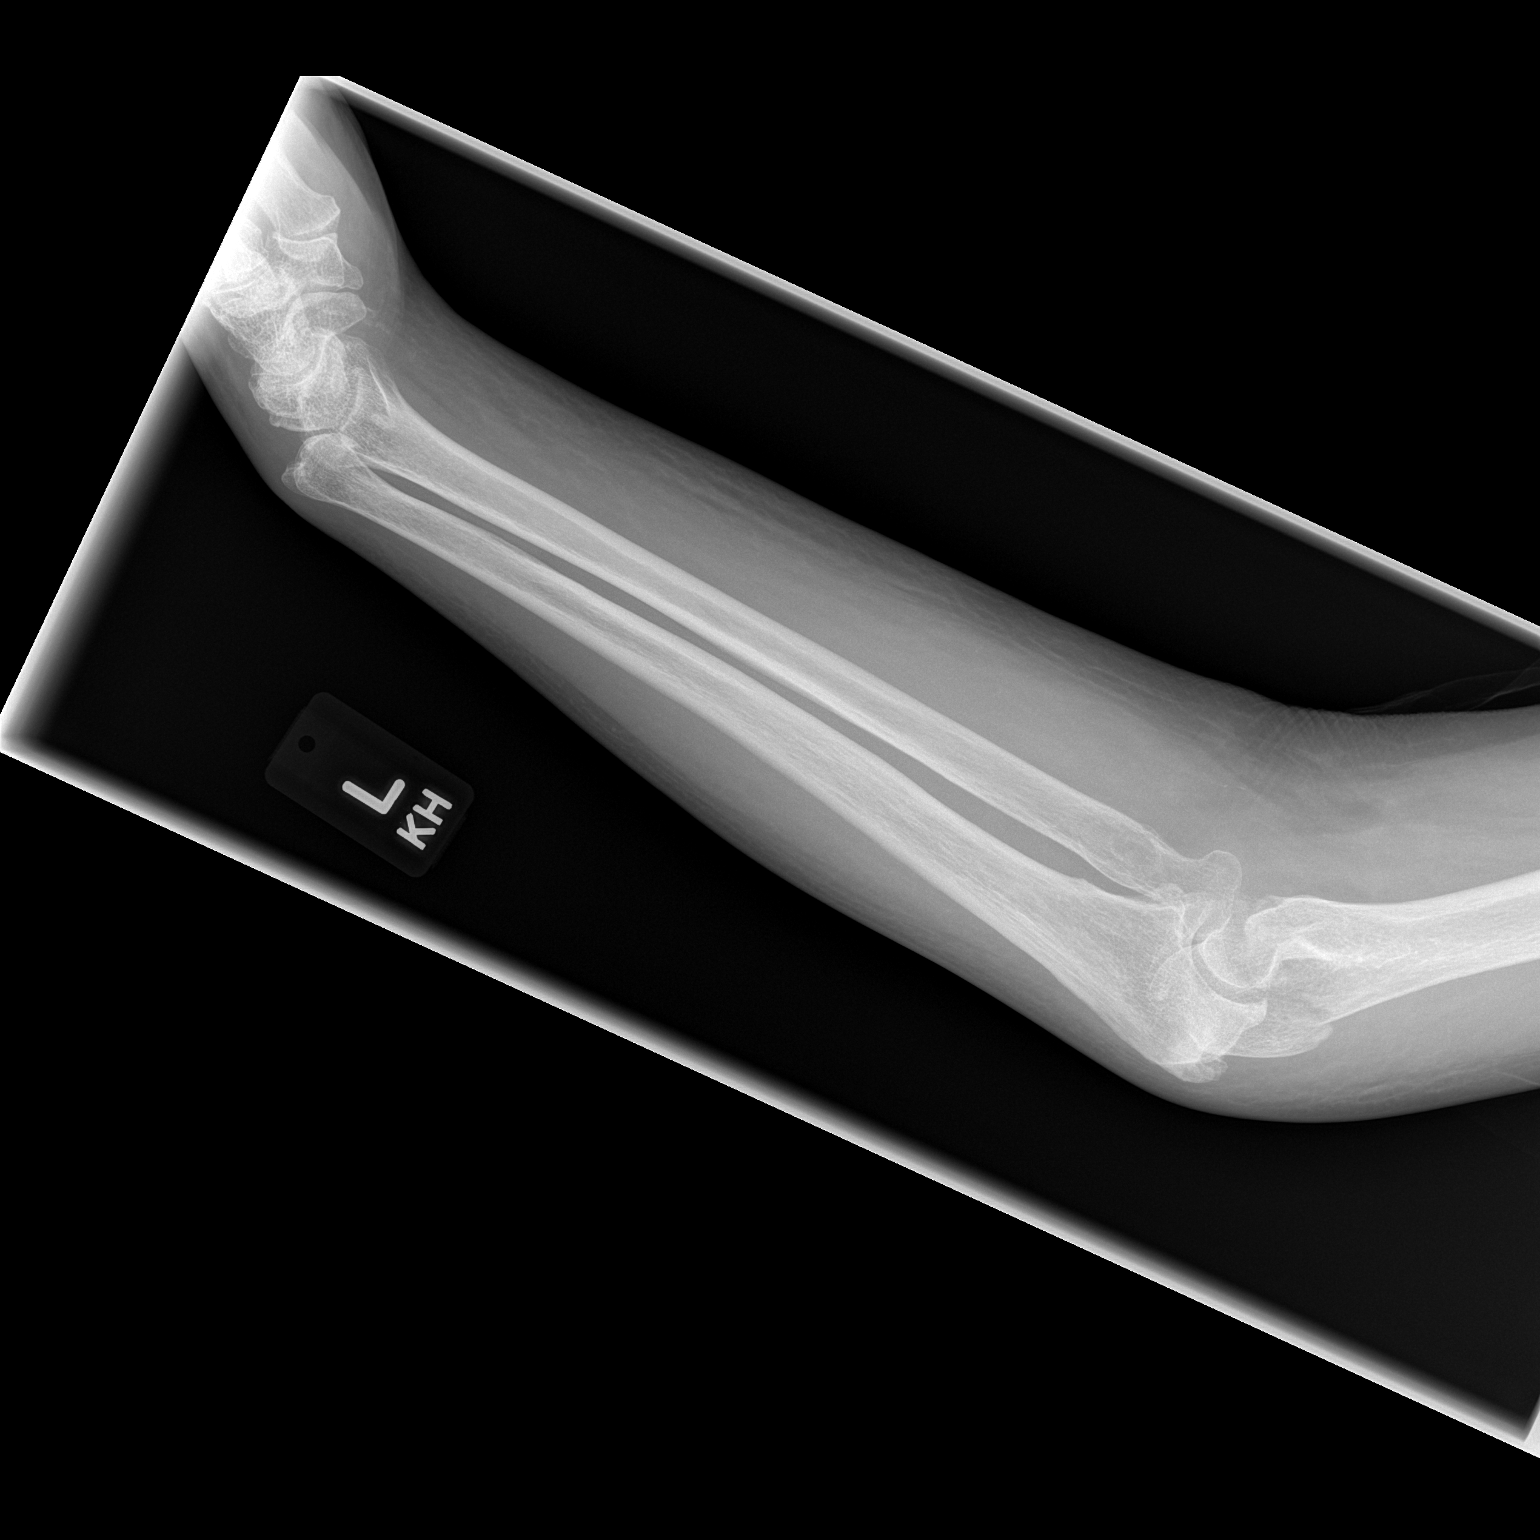

[x forearm ap left]
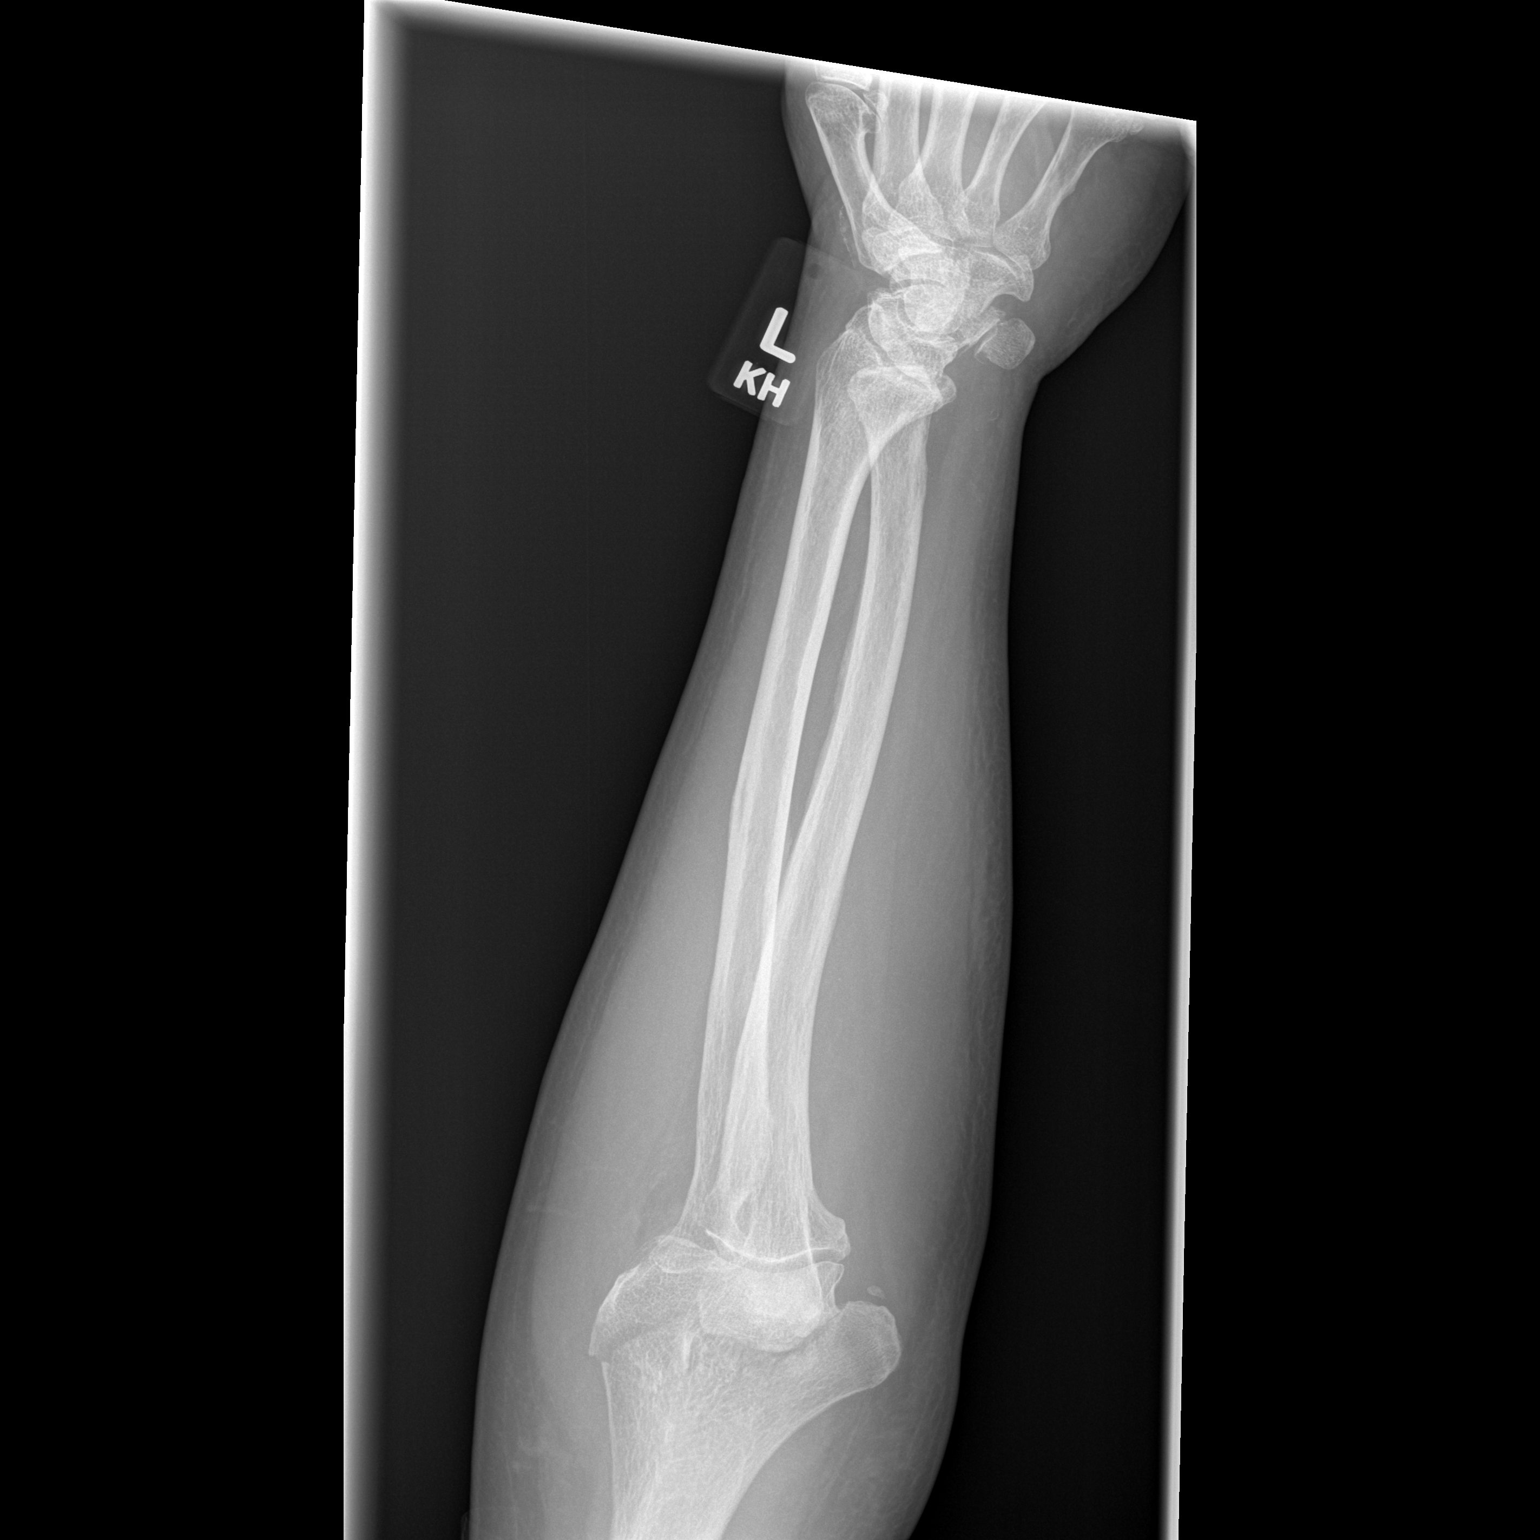

[2 of 2 positions shown; findings below may reference images not displayed]

FINDINGS: Evaluation is limited due to patient positioning. There is a
minimally displaced appearing supracondylar fracture of the distal
humerus, possibly comminuted. No definite dislocation at the elbow.
There is slight dorsal positioning of the distal ulna in relation to
the carpal bones which may be chronic or represent a degree of
subluxation. Clinical correlation is recommended. There is
associated positive ulnar variance. The bones are osteopenic. The
soft tissues are grossly unremarkable.
IMPRESSION: 1. Mildly displaced supracondylar fracture.
2. Chronic dorsal positioning of the distal ulna versus mild
subluxation at the wrist. Clinical correlation is recommended.

## 2020-04-01 IMAGING — CR DG WRIST 2V*L*
2 series · 2 of 2 positions shown · non-contrast
Comparison: None.

CLINICAL DATA: 78-year-old male with fall.

EXAM:
LEFT WRIST - 2 VIEW; LEFT FOREARM - 2 VIEW

[x wrist pa left]
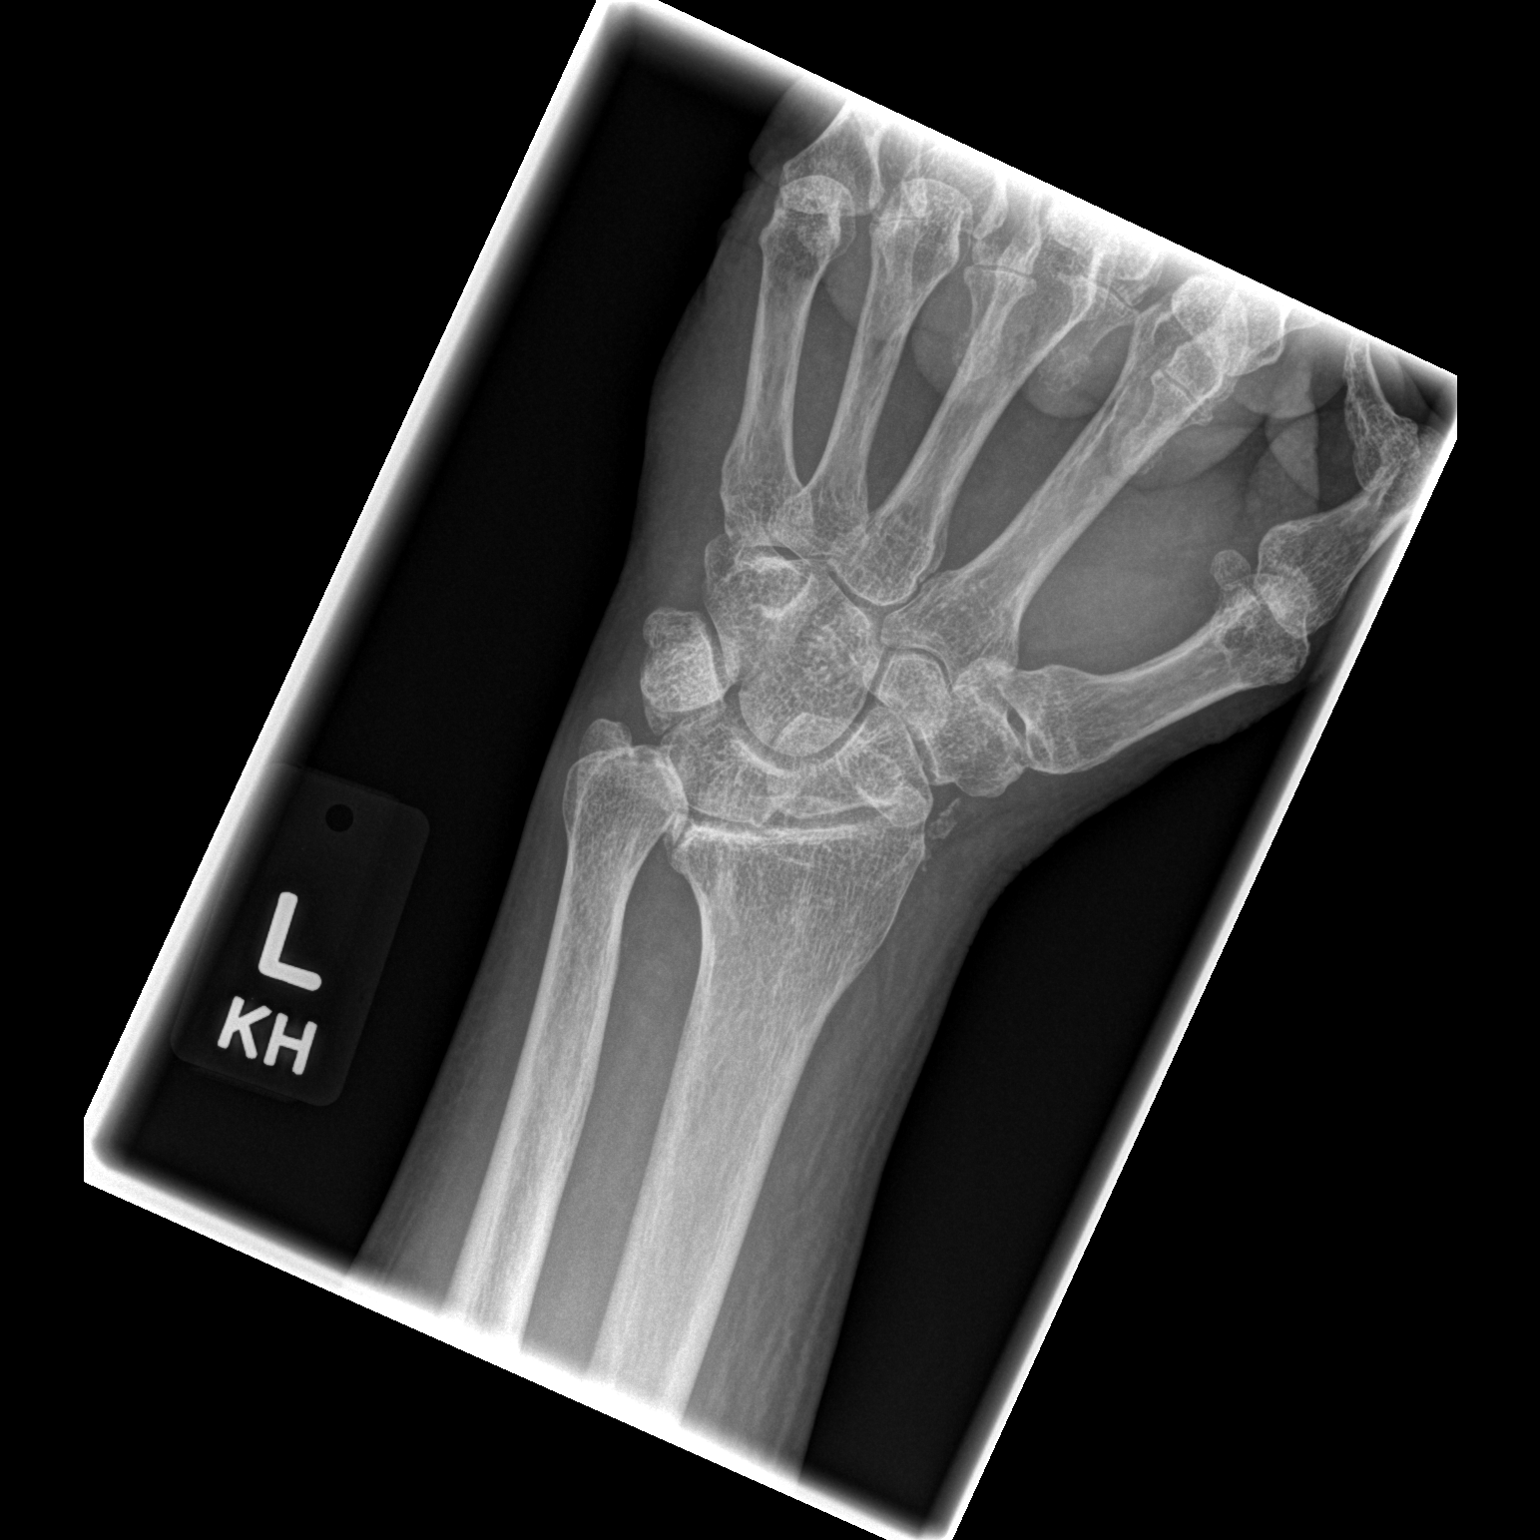

[x wrist lat left]
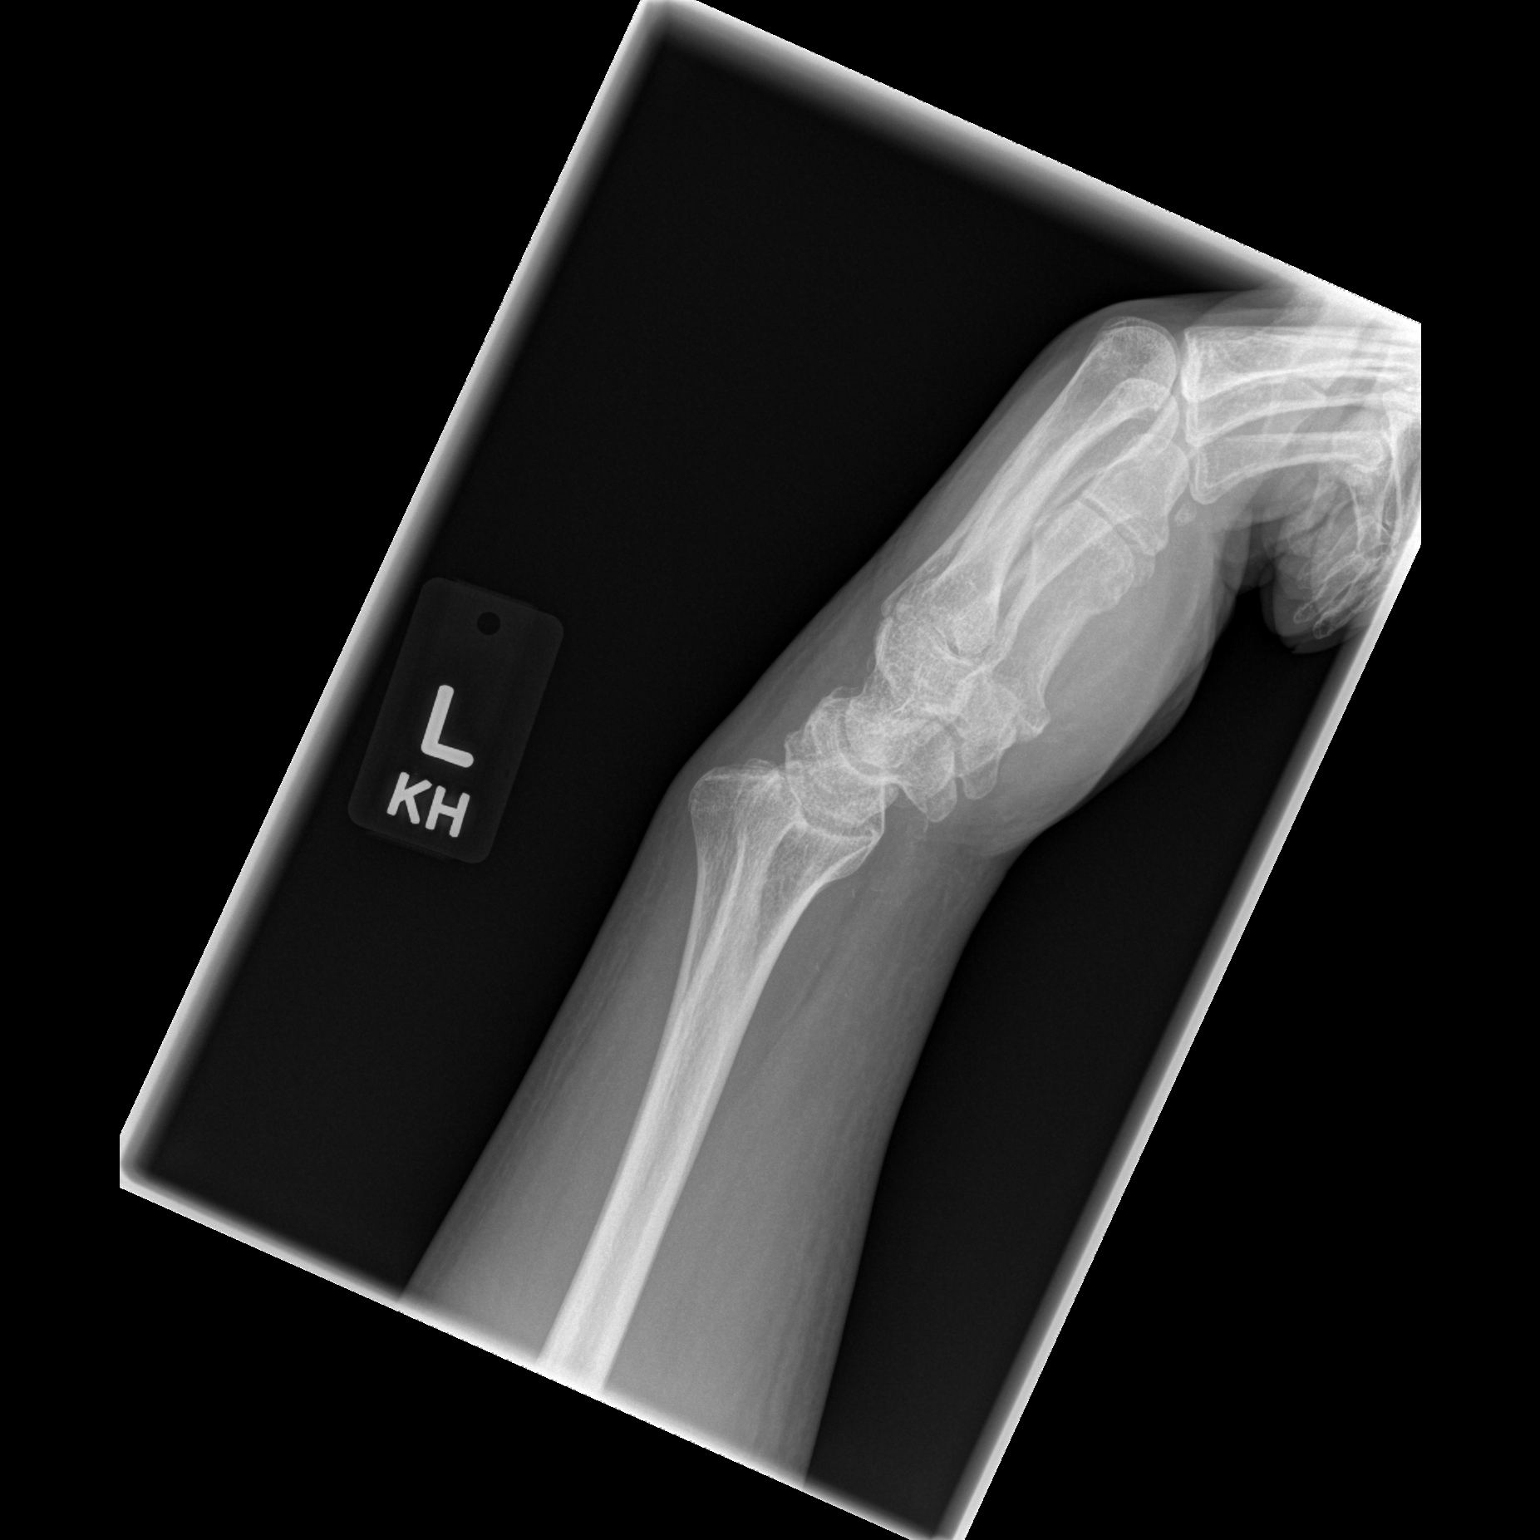

[2 of 2 positions shown; findings below may reference images not displayed]

FINDINGS: Evaluation is limited due to patient positioning. There is a
minimally displaced appearing supracondylar fracture of the distal
humerus, possibly comminuted. No definite dislocation at the elbow.
There is slight dorsal positioning of the distal ulna in relation to
the carpal bones which may be chronic or represent a degree of
subluxation. Clinical correlation is recommended. There is
associated positive ulnar variance. The bones are osteopenic. The
soft tissues are grossly unremarkable.
IMPRESSION: 1. Mildly displaced supracondylar fracture.
2. Chronic dorsal positioning of the distal ulna versus mild
subluxation at the wrist. Clinical correlation is recommended.

## 2020-04-04 DIAGNOSIS — N3000 Acute cystitis without hematuria: Secondary | ICD-10-CM | POA: Diagnosis not present

## 2020-06-22 DIAGNOSIS — L4059 Other psoriatic arthropathy: Secondary | ICD-10-CM | POA: Diagnosis not present

## 2020-06-28 DIAGNOSIS — R32 Unspecified urinary incontinence: Secondary | ICD-10-CM | POA: Diagnosis not present

## 2020-06-28 DIAGNOSIS — I69359 Hemiplegia and hemiparesis following cerebral infarction affecting unspecified side: Secondary | ICD-10-CM | POA: Diagnosis not present

## 2020-08-02 DIAGNOSIS — M150 Primary generalized (osteo)arthritis: Secondary | ICD-10-CM | POA: Diagnosis not present

## 2020-08-02 DIAGNOSIS — L4059 Other psoriatic arthropathy: Secondary | ICD-10-CM | POA: Diagnosis not present

## 2020-08-02 DIAGNOSIS — M25511 Pain in right shoulder: Secondary | ICD-10-CM | POA: Diagnosis not present

## 2020-08-02 DIAGNOSIS — Z8673 Personal history of transient ischemic attack (TIA), and cerebral infarction without residual deficits: Secondary | ICD-10-CM | POA: Diagnosis not present

## 2020-08-02 DIAGNOSIS — M79641 Pain in right hand: Secondary | ICD-10-CM | POA: Diagnosis not present

## 2020-08-02 DIAGNOSIS — M21372 Foot drop, left foot: Secondary | ICD-10-CM | POA: Diagnosis not present

## 2020-08-26 DIAGNOSIS — R32 Unspecified urinary incontinence: Secondary | ICD-10-CM | POA: Diagnosis not present

## 2020-08-26 DIAGNOSIS — I69359 Hemiplegia and hemiparesis following cerebral infarction affecting unspecified side: Secondary | ICD-10-CM | POA: Diagnosis not present

## 2020-09-05 DIAGNOSIS — Z23 Encounter for immunization: Secondary | ICD-10-CM | POA: Diagnosis not present

## 2020-09-05 DIAGNOSIS — G8114 Spastic hemiplegia affecting left nondominant side: Secondary | ICD-10-CM | POA: Diagnosis not present

## 2020-09-05 DIAGNOSIS — I69359 Hemiplegia and hemiparesis following cerebral infarction affecting unspecified side: Secondary | ICD-10-CM | POA: Diagnosis not present

## 2020-09-05 DIAGNOSIS — M7541 Impingement syndrome of right shoulder: Secondary | ICD-10-CM | POA: Diagnosis not present

## 2020-09-05 DIAGNOSIS — L309 Dermatitis, unspecified: Secondary | ICD-10-CM | POA: Diagnosis not present

## 2020-09-26 DIAGNOSIS — R32 Unspecified urinary incontinence: Secondary | ICD-10-CM | POA: Diagnosis not present

## 2020-09-26 DIAGNOSIS — I69359 Hemiplegia and hemiparesis following cerebral infarction affecting unspecified side: Secondary | ICD-10-CM | POA: Diagnosis not present

## 2020-11-01 DIAGNOSIS — L4059 Other psoriatic arthropathy: Secondary | ICD-10-CM | POA: Diagnosis not present

## 2020-11-28 DIAGNOSIS — Z0100 Encounter for examination of eyes and vision without abnormal findings: Secondary | ICD-10-CM | POA: Diagnosis not present

## 2020-11-28 DIAGNOSIS — H524 Presbyopia: Secondary | ICD-10-CM | POA: Diagnosis not present

## 2020-11-28 DIAGNOSIS — H35033 Hypertensive retinopathy, bilateral: Secondary | ICD-10-CM | POA: Diagnosis not present

## 2020-12-13 DIAGNOSIS — E559 Vitamin D deficiency, unspecified: Secondary | ICD-10-CM | POA: Diagnosis not present

## 2020-12-13 DIAGNOSIS — G8114 Spastic hemiplegia affecting left nondominant side: Secondary | ICD-10-CM | POA: Diagnosis not present

## 2020-12-13 DIAGNOSIS — E785 Hyperlipidemia, unspecified: Secondary | ICD-10-CM | POA: Diagnosis not present

## 2020-12-13 DIAGNOSIS — R7303 Prediabetes: Secondary | ICD-10-CM | POA: Diagnosis not present

## 2020-12-13 DIAGNOSIS — I69359 Hemiplegia and hemiparesis following cerebral infarction affecting unspecified side: Secondary | ICD-10-CM | POA: Diagnosis not present

## 2020-12-13 DIAGNOSIS — Z79899 Other long term (current) drug therapy: Secondary | ICD-10-CM | POA: Diagnosis not present

## 2020-12-13 DIAGNOSIS — D692 Other nonthrombocytopenic purpura: Secondary | ICD-10-CM | POA: Diagnosis not present

## 2020-12-13 DIAGNOSIS — Z0000 Encounter for general adult medical examination without abnormal findings: Secondary | ICD-10-CM | POA: Diagnosis not present

## 2020-12-13 DIAGNOSIS — M1990 Unspecified osteoarthritis, unspecified site: Secondary | ICD-10-CM | POA: Diagnosis not present

## 2020-12-13 DIAGNOSIS — I10 Essential (primary) hypertension: Secondary | ICD-10-CM | POA: Diagnosis not present

## 2020-12-29 DIAGNOSIS — R32 Unspecified urinary incontinence: Secondary | ICD-10-CM | POA: Diagnosis not present

## 2021-01-23 DIAGNOSIS — R32 Unspecified urinary incontinence: Secondary | ICD-10-CM | POA: Diagnosis not present

## 2021-02-24 DIAGNOSIS — R32 Unspecified urinary incontinence: Secondary | ICD-10-CM | POA: Diagnosis not present

## 2021-03-07 DIAGNOSIS — L4059 Other psoriatic arthropathy: Secondary | ICD-10-CM | POA: Diagnosis not present

## 2021-03-07 DIAGNOSIS — M21372 Foot drop, left foot: Secondary | ICD-10-CM | POA: Diagnosis not present

## 2021-03-07 DIAGNOSIS — Z8673 Personal history of transient ischemic attack (TIA), and cerebral infarction without residual deficits: Secondary | ICD-10-CM | POA: Diagnosis not present

## 2021-03-07 DIAGNOSIS — M25511 Pain in right shoulder: Secondary | ICD-10-CM | POA: Diagnosis not present

## 2021-03-07 DIAGNOSIS — M150 Primary generalized (osteo)arthritis: Secondary | ICD-10-CM | POA: Diagnosis not present

## 2021-03-07 DIAGNOSIS — M79641 Pain in right hand: Secondary | ICD-10-CM | POA: Diagnosis not present

## 2021-03-26 DIAGNOSIS — R32 Unspecified urinary incontinence: Secondary | ICD-10-CM | POA: Diagnosis not present

## 2021-04-26 DIAGNOSIS — R32 Unspecified urinary incontinence: Secondary | ICD-10-CM | POA: Diagnosis not present

## 2021-04-27 DIAGNOSIS — U071 COVID-19: Secondary | ICD-10-CM | POA: Diagnosis not present

## 2021-05-25 DIAGNOSIS — R32 Unspecified urinary incontinence: Secondary | ICD-10-CM | POA: Diagnosis not present

## 2021-06-06 DIAGNOSIS — L4059 Other psoriatic arthropathy: Secondary | ICD-10-CM | POA: Diagnosis not present

## 2021-06-26 DIAGNOSIS — R32 Unspecified urinary incontinence: Secondary | ICD-10-CM | POA: Diagnosis not present

## 2021-07-28 DIAGNOSIS — R32 Unspecified urinary incontinence: Secondary | ICD-10-CM | POA: Diagnosis not present

## 2021-08-28 DIAGNOSIS — R32 Unspecified urinary incontinence: Secondary | ICD-10-CM | POA: Diagnosis not present

## 2021-09-06 DIAGNOSIS — L4059 Other psoriatic arthropathy: Secondary | ICD-10-CM | POA: Diagnosis not present

## 2021-09-06 DIAGNOSIS — Z8673 Personal history of transient ischemic attack (TIA), and cerebral infarction without residual deficits: Secondary | ICD-10-CM | POA: Diagnosis not present

## 2021-09-06 DIAGNOSIS — M25511 Pain in right shoulder: Secondary | ICD-10-CM | POA: Diagnosis not present

## 2021-09-06 DIAGNOSIS — M21372 Foot drop, left foot: Secondary | ICD-10-CM | POA: Diagnosis not present

## 2021-09-06 DIAGNOSIS — M150 Primary generalized (osteo)arthritis: Secondary | ICD-10-CM | POA: Diagnosis not present

## 2021-10-26 ENCOUNTER — Emergency Department (HOSPITAL_COMMUNITY)
Admission: EM | Admit: 2021-10-26 | Discharge: 2021-10-27 | Disposition: A | Discharge status: Home / Self Care | Payer: Medicare HMO | Attending: Emergency Medicine | Admitting: Emergency Medicine

## 2021-10-26 ENCOUNTER — Other Ambulatory Visit: Payer: Self-pay

## 2021-10-26 ENCOUNTER — Encounter (HOSPITAL_COMMUNITY): Payer: Self-pay

## 2021-10-26 ENCOUNTER — Emergency Department (HOSPITAL_COMMUNITY): Payer: Medicare HMO

## 2021-10-26 DIAGNOSIS — R030 Elevated blood-pressure reading, without diagnosis of hypertension: Secondary | ICD-10-CM | POA: Insufficient documentation

## 2021-10-26 DIAGNOSIS — R0789 Other chest pain: Secondary | ICD-10-CM | POA: Diagnosis not present

## 2021-10-26 DIAGNOSIS — R42 Dizziness and giddiness: Secondary | ICD-10-CM | POA: Insufficient documentation

## 2021-10-26 DIAGNOSIS — R0602 Shortness of breath: Secondary | ICD-10-CM | POA: Diagnosis not present

## 2021-10-26 DIAGNOSIS — Z5321 Procedure and treatment not carried out due to patient leaving prior to being seen by health care provider: Secondary | ICD-10-CM | POA: Diagnosis not present

## 2021-10-26 DIAGNOSIS — R079 Chest pain, unspecified: Secondary | ICD-10-CM | POA: Insufficient documentation

## 2021-10-26 LAB — CBC
HCT: 39.5 % (ref 39.0–52.0)
Hemoglobin: 13.1 g/dL (ref 13.0–17.0)
MCH: 32.5 pg (ref 26.0–34.0)
MCHC: 33.2 g/dL (ref 30.0–36.0)
MCV: 98 fL (ref 80.0–100.0)
Platelets: 194 10*3/uL (ref 150–400)
RBC: 4.03 MIL/uL — ABNORMAL LOW (ref 4.22–5.81)
RDW: 13.5 % (ref 11.5–15.5)
WBC: 6.3 10*3/uL (ref 4.0–10.5)
nRBC: 0 % (ref 0.0–0.2)

## 2021-10-26 LAB — BASIC METABOLIC PANEL
Anion gap: 6 (ref 5–15)
BUN: 17 mg/dL (ref 8–23)
CO2: 25 mmol/L (ref 22–32)
Calcium: 8.9 mg/dL (ref 8.9–10.3)
Chloride: 105 mmol/L (ref 98–111)
Creatinine, Ser: 0.89 mg/dL (ref 0.61–1.24)
GFR, Estimated: >60 mL/min (ref >60)
Glucose, Bld: 106 mg/dL — ABNORMAL HIGH (ref 70–99)
Potassium: 3.6 mmol/L (ref 3.5–5.1)
Sodium: 136 mmol/L (ref 135–145)

## 2021-10-26 LAB — TROPONIN I (HIGH SENSITIVITY): Troponin I (High Sensitivity): 11 ng/L (ref <18)

## 2021-10-26 NOTE — ED Provider Notes (Signed)
Emergency Medicine Provider Triage Evaluation Note  Danny Lara , a 80 y.o. male  was evaluated in triage.  Pt complains of chest pain and elevated blood pressure   Review of Systems  Positive: Shortness of breath Negative: No fever   Physical Exam  BP 131/75 (BP Location: Left Arm)   Pulse 68   Temp (!) 97.5 F (36.4 C) (Oral)   Resp 18   Ht 5\' 4"  (1.626 m)   Wt 56.7 kg   SpO2 100%   BMI 21.46 kg/m  Gen:   Awake, no distress   Resp:  Normal effort  MSK:   Moves extremities without difficulty  Other:    Medical Decision Making  Medically screening exam initiated at 5:36 PM.  Appropriate orders placed.  Danny Lara was informed that the remainder of the evaluation will be completed by another provider, this initial triage assessment does not replace that evaluation, and the importance of remaining in the ED until their evaluation is complete.     Fransico Meadow, [place] 10/26/21 1737    Tegeler, Gwenyth Allegra, MD 10/26/21 1750

## 2021-10-26 NOTE — ED Triage Notes (Addendum)
Patient c/o right chest pain, dizziness, and SOB since 1330 today.  Patient added that he has right shoulder pain and tingling of the right arm.

## 2021-10-27 NOTE — ED Notes (Signed)
Name called for vital sign reassessment - no answer.

## 2021-12-11 DIAGNOSIS — L4059 Other psoriatic arthropathy: Secondary | ICD-10-CM | POA: Diagnosis not present

## 2022-01-03 DIAGNOSIS — R7303 Prediabetes: Secondary | ICD-10-CM | POA: Diagnosis not present

## 2022-01-03 DIAGNOSIS — Z0000 Encounter for general adult medical examination without abnormal findings: Secondary | ICD-10-CM | POA: Diagnosis not present

## 2022-01-03 DIAGNOSIS — I10 Essential (primary) hypertension: Secondary | ICD-10-CM | POA: Diagnosis not present

## 2022-01-03 DIAGNOSIS — Z79899 Other long term (current) drug therapy: Secondary | ICD-10-CM | POA: Diagnosis not present

## 2022-01-03 DIAGNOSIS — J309 Allergic rhinitis, unspecified: Secondary | ICD-10-CM | POA: Diagnosis not present

## 2022-01-03 DIAGNOSIS — D692 Other nonthrombocytopenic purpura: Secondary | ICD-10-CM | POA: Diagnosis not present

## 2022-01-03 DIAGNOSIS — E559 Vitamin D deficiency, unspecified: Secondary | ICD-10-CM | POA: Diagnosis not present

## 2022-01-03 DIAGNOSIS — E041 Nontoxic single thyroid nodule: Secondary | ICD-10-CM | POA: Diagnosis not present

## 2022-01-03 DIAGNOSIS — L309 Dermatitis, unspecified: Secondary | ICD-10-CM | POA: Diagnosis not present

## 2022-03-19 DIAGNOSIS — M1991 Primary osteoarthritis, unspecified site: Secondary | ICD-10-CM | POA: Diagnosis not present

## 2022-03-19 DIAGNOSIS — Z6821 Body mass index (BMI) 21.0-21.9, adult: Secondary | ICD-10-CM | POA: Diagnosis not present

## 2022-03-19 DIAGNOSIS — M21372 Foot drop, left foot: Secondary | ICD-10-CM | POA: Diagnosis not present

## 2022-03-19 DIAGNOSIS — Z8673 Personal history of transient ischemic attack (TIA), and cerebral infarction without residual deficits: Secondary | ICD-10-CM | POA: Diagnosis not present

## 2022-03-19 DIAGNOSIS — M25511 Pain in right shoulder: Secondary | ICD-10-CM | POA: Diagnosis not present

## 2022-03-19 DIAGNOSIS — L4059 Other psoriatic arthropathy: Secondary | ICD-10-CM | POA: Diagnosis not present

## 2022-06-20 DIAGNOSIS — L4059 Other psoriatic arthropathy: Secondary | ICD-10-CM | POA: Diagnosis not present

## 2022-07-16 DIAGNOSIS — H524 Presbyopia: Secondary | ICD-10-CM | POA: Diagnosis not present

## 2022-07-16 DIAGNOSIS — Z135 Encounter for screening for eye and ear disorders: Secondary | ICD-10-CM | POA: Diagnosis not present

## 2022-07-24 DIAGNOSIS — U071 COVID-19: Secondary | ICD-10-CM | POA: Diagnosis not present

## 2022-07-29 DIAGNOSIS — R32 Unspecified urinary incontinence: Secondary | ICD-10-CM | POA: Diagnosis not present

## 2022-08-28 DIAGNOSIS — R32 Unspecified urinary incontinence: Secondary | ICD-10-CM | POA: Diagnosis not present

## 2022-09-17 DIAGNOSIS — M25511 Pain in right shoulder: Secondary | ICD-10-CM | POA: Diagnosis not present

## 2022-09-17 DIAGNOSIS — M21372 Foot drop, left foot: Secondary | ICD-10-CM | POA: Diagnosis not present

## 2022-09-17 DIAGNOSIS — M1991 Primary osteoarthritis, unspecified site: Secondary | ICD-10-CM | POA: Diagnosis not present

## 2022-09-17 DIAGNOSIS — Z8673 Personal history of transient ischemic attack (TIA), and cerebral infarction without residual deficits: Secondary | ICD-10-CM | POA: Diagnosis not present

## 2022-09-17 DIAGNOSIS — L4059 Other psoriatic arthropathy: Secondary | ICD-10-CM | POA: Diagnosis not present

## 2022-09-25 DIAGNOSIS — R32 Unspecified urinary incontinence: Secondary | ICD-10-CM | POA: Diagnosis not present

## 2022-10-04 DIAGNOSIS — I69359 Hemiplegia and hemiparesis following cerebral infarction affecting unspecified side: Secondary | ICD-10-CM | POA: Diagnosis not present

## 2022-10-25 DIAGNOSIS — K219 Gastro-esophageal reflux disease without esophagitis: Secondary | ICD-10-CM | POA: Diagnosis not present

## 2022-10-25 DIAGNOSIS — R7303 Prediabetes: Secondary | ICD-10-CM | POA: Diagnosis not present

## 2022-10-25 DIAGNOSIS — I10 Essential (primary) hypertension: Secondary | ICD-10-CM | POA: Diagnosis not present

## 2022-10-25 DIAGNOSIS — M6281 Muscle weakness (generalized): Secondary | ICD-10-CM | POA: Diagnosis not present

## 2022-10-25 DIAGNOSIS — L0100 Impetigo, unspecified: Secondary | ICD-10-CM | POA: Diagnosis not present

## 2022-10-25 DIAGNOSIS — Z79631 Long term (current) use of antimetabolite agent: Secondary | ICD-10-CM | POA: Diagnosis not present

## 2022-10-25 DIAGNOSIS — M179 Osteoarthritis of knee, unspecified: Secondary | ICD-10-CM | POA: Diagnosis not present

## 2022-10-25 DIAGNOSIS — R32 Unspecified urinary incontinence: Secondary | ICD-10-CM | POA: Diagnosis not present

## 2022-10-25 DIAGNOSIS — I639 Cerebral infarction, unspecified: Secondary | ICD-10-CM | POA: Diagnosis not present

## 2022-10-25 DIAGNOSIS — E785 Hyperlipidemia, unspecified: Secondary | ICD-10-CM | POA: Diagnosis not present

## 2022-10-25 DIAGNOSIS — I69354 Hemiplegia and hemiparesis following cerebral infarction affecting left non-dominant side: Secondary | ICD-10-CM | POA: Diagnosis not present

## 2022-10-25 DIAGNOSIS — G629 Polyneuropathy, unspecified: Secondary | ICD-10-CM | POA: Diagnosis not present

## 2022-10-25 DIAGNOSIS — Z4689 Encounter for fitting and adjustment of other specified devices: Secondary | ICD-10-CM | POA: Diagnosis not present

## 2022-10-26 DIAGNOSIS — M6281 Muscle weakness (generalized): Secondary | ICD-10-CM | POA: Diagnosis not present

## 2022-10-29 DIAGNOSIS — I69354 Hemiplegia and hemiparesis following cerebral infarction affecting left non-dominant side: Secondary | ICD-10-CM | POA: Diagnosis not present

## 2022-10-29 DIAGNOSIS — I10 Essential (primary) hypertension: Secondary | ICD-10-CM | POA: Diagnosis not present

## 2022-10-29 DIAGNOSIS — L0100 Impetigo, unspecified: Secondary | ICD-10-CM | POA: Diagnosis not present

## 2022-10-29 DIAGNOSIS — E785 Hyperlipidemia, unspecified: Secondary | ICD-10-CM | POA: Diagnosis not present

## 2022-10-29 DIAGNOSIS — K219 Gastro-esophageal reflux disease without esophagitis: Secondary | ICD-10-CM | POA: Diagnosis not present

## 2022-10-29 DIAGNOSIS — I639 Cerebral infarction, unspecified: Secondary | ICD-10-CM | POA: Diagnosis not present

## 2022-10-30 DIAGNOSIS — E785 Hyperlipidemia, unspecified: Secondary | ICD-10-CM | POA: Diagnosis not present

## 2022-10-30 DIAGNOSIS — Z79631 Long term (current) use of antimetabolite agent: Secondary | ICD-10-CM | POA: Diagnosis not present

## 2022-10-30 DIAGNOSIS — I639 Cerebral infarction, unspecified: Secondary | ICD-10-CM | POA: Diagnosis not present

## 2022-10-30 DIAGNOSIS — L0100 Impetigo, unspecified: Secondary | ICD-10-CM | POA: Diagnosis not present

## 2022-10-30 DIAGNOSIS — I69354 Hemiplegia and hemiparesis following cerebral infarction affecting left non-dominant side: Secondary | ICD-10-CM | POA: Diagnosis not present

## 2022-10-30 DIAGNOSIS — I10 Essential (primary) hypertension: Secondary | ICD-10-CM | POA: Diagnosis not present

## 2022-10-30 DIAGNOSIS — K219 Gastro-esophageal reflux disease without esophagitis: Secondary | ICD-10-CM | POA: Diagnosis not present

## 2022-10-31 DIAGNOSIS — M6281 Muscle weakness (generalized): Secondary | ICD-10-CM | POA: Diagnosis not present

## 2022-11-01 DIAGNOSIS — M6281 Muscle weakness (generalized): Secondary | ICD-10-CM | POA: Diagnosis not present

## 2022-11-04 DIAGNOSIS — M6281 Muscle weakness (generalized): Secondary | ICD-10-CM | POA: Diagnosis not present

## 2022-11-05 DIAGNOSIS — M6281 Muscle weakness (generalized): Secondary | ICD-10-CM | POA: Diagnosis not present

## 2022-11-06 DIAGNOSIS — M6281 Muscle weakness (generalized): Secondary | ICD-10-CM | POA: Diagnosis not present

## 2022-11-08 DIAGNOSIS — M6281 Muscle weakness (generalized): Secondary | ICD-10-CM | POA: Diagnosis not present

## 2022-11-11 DIAGNOSIS — M6281 Muscle weakness (generalized): Secondary | ICD-10-CM | POA: Diagnosis not present

## 2022-11-12 DIAGNOSIS — M6281 Muscle weakness (generalized): Secondary | ICD-10-CM | POA: Diagnosis not present

## 2022-11-14 DIAGNOSIS — M6281 Muscle weakness (generalized): Secondary | ICD-10-CM | POA: Diagnosis not present

## 2022-12-04 DIAGNOSIS — D8989 Other specified disorders involving the immune mechanism, not elsewhere classified: Secondary | ICD-10-CM | POA: Diagnosis not present

## 2022-12-04 DIAGNOSIS — I10 Essential (primary) hypertension: Secondary | ICD-10-CM | POA: Diagnosis not present

## 2022-12-04 DIAGNOSIS — I69354 Hemiplegia and hemiparesis following cerebral infarction affecting left non-dominant side: Secondary | ICD-10-CM | POA: Diagnosis not present

## 2022-12-04 DIAGNOSIS — E785 Hyperlipidemia, unspecified: Secondary | ICD-10-CM | POA: Diagnosis not present

## 2022-12-04 DIAGNOSIS — K219 Gastro-esophageal reflux disease without esophagitis: Secondary | ICD-10-CM | POA: Diagnosis not present

## 2022-12-17 DIAGNOSIS — E785 Hyperlipidemia, unspecified: Secondary | ICD-10-CM | POA: Diagnosis not present

## 2022-12-17 DIAGNOSIS — I10 Essential (primary) hypertension: Secondary | ICD-10-CM | POA: Diagnosis not present

## 2022-12-17 DIAGNOSIS — K219 Gastro-esophageal reflux disease without esophagitis: Secondary | ICD-10-CM | POA: Diagnosis not present

## 2022-12-19 DIAGNOSIS — L4059 Other psoriatic arthropathy: Secondary | ICD-10-CM | POA: Diagnosis not present

## 2022-12-26 DIAGNOSIS — R32 Unspecified urinary incontinence: Secondary | ICD-10-CM | POA: Diagnosis not present

## 2023-01-24 DIAGNOSIS — R32 Unspecified urinary incontinence: Secondary | ICD-10-CM | POA: Diagnosis not present

## 2023-02-12 DIAGNOSIS — Z79631 Long term (current) use of antimetabolite agent: Secondary | ICD-10-CM | POA: Diagnosis not present

## 2023-02-12 DIAGNOSIS — I10 Essential (primary) hypertension: Secondary | ICD-10-CM | POA: Diagnosis not present

## 2023-02-12 DIAGNOSIS — I69354 Hemiplegia and hemiparesis following cerebral infarction affecting left non-dominant side: Secondary | ICD-10-CM | POA: Diagnosis not present

## 2023-02-12 DIAGNOSIS — M069 Rheumatoid arthritis, unspecified: Secondary | ICD-10-CM | POA: Diagnosis not present

## 2023-02-12 DIAGNOSIS — E785 Hyperlipidemia, unspecified: Secondary | ICD-10-CM | POA: Diagnosis not present

## 2023-02-12 DIAGNOSIS — K219 Gastro-esophageal reflux disease without esophagitis: Secondary | ICD-10-CM | POA: Diagnosis not present

## 2023-02-19 DIAGNOSIS — I739 Peripheral vascular disease, unspecified: Secondary | ICD-10-CM | POA: Diagnosis not present

## 2023-02-19 DIAGNOSIS — L603 Nail dystrophy: Secondary | ICD-10-CM | POA: Diagnosis not present

## 2023-02-20 DIAGNOSIS — M179 Osteoarthritis of knee, unspecified: Secondary | ICD-10-CM | POA: Diagnosis not present

## 2023-02-20 DIAGNOSIS — M069 Rheumatoid arthritis, unspecified: Secondary | ICD-10-CM | POA: Diagnosis not present

## 2023-03-27 DIAGNOSIS — R32 Unspecified urinary incontinence: Secondary | ICD-10-CM | POA: Diagnosis not present

## 2023-03-28 DIAGNOSIS — I10 Essential (primary) hypertension: Secondary | ICD-10-CM | POA: Diagnosis not present

## 2023-04-01 DIAGNOSIS — G909 Disorder of the autonomic nervous system, unspecified: Secondary | ICD-10-CM | POA: Diagnosis not present

## 2023-04-01 DIAGNOSIS — K219 Gastro-esophageal reflux disease without esophagitis: Secondary | ICD-10-CM | POA: Diagnosis not present

## 2023-04-01 DIAGNOSIS — M069 Rheumatoid arthritis, unspecified: Secondary | ICD-10-CM | POA: Diagnosis not present

## 2023-04-01 DIAGNOSIS — E569 Vitamin deficiency, unspecified: Secondary | ICD-10-CM | POA: Diagnosis not present

## 2023-04-01 DIAGNOSIS — E782 Mixed hyperlipidemia: Secondary | ICD-10-CM | POA: Diagnosis not present

## 2023-04-01 DIAGNOSIS — I10 Essential (primary) hypertension: Secondary | ICD-10-CM | POA: Diagnosis not present

## 2023-04-08 DIAGNOSIS — I10 Essential (primary) hypertension: Secondary | ICD-10-CM | POA: Diagnosis not present

## 2023-04-08 DIAGNOSIS — K219 Gastro-esophageal reflux disease without esophagitis: Secondary | ICD-10-CM | POA: Diagnosis not present

## 2023-04-08 DIAGNOSIS — I69354 Hemiplegia and hemiparesis following cerebral infarction affecting left non-dominant side: Secondary | ICD-10-CM | POA: Diagnosis not present

## 2023-04-08 DIAGNOSIS — M069 Rheumatoid arthritis, unspecified: Secondary | ICD-10-CM | POA: Diagnosis not present

## 2023-04-08 DIAGNOSIS — E785 Hyperlipidemia, unspecified: Secondary | ICD-10-CM | POA: Diagnosis not present

## 2023-05-01 DIAGNOSIS — M21372 Foot drop, left foot: Secondary | ICD-10-CM | POA: Diagnosis not present

## 2023-05-01 DIAGNOSIS — L4059 Other psoriatic arthropathy: Secondary | ICD-10-CM | POA: Diagnosis not present

## 2023-05-01 DIAGNOSIS — M1991 Primary osteoarthritis, unspecified site: Secondary | ICD-10-CM | POA: Diagnosis not present

## 2023-05-01 DIAGNOSIS — Z8673 Personal history of transient ischemic attack (TIA), and cerebral infarction without residual deficits: Secondary | ICD-10-CM | POA: Diagnosis not present

## 2023-05-01 DIAGNOSIS — R7989 Other specified abnormal findings of blood chemistry: Secondary | ICD-10-CM | POA: Diagnosis not present

## 2023-05-01 DIAGNOSIS — M25511 Pain in right shoulder: Secondary | ICD-10-CM | POA: Diagnosis not present

## 2023-05-19 DIAGNOSIS — E569 Vitamin deficiency, unspecified: Secondary | ICD-10-CM | POA: Diagnosis not present

## 2023-05-19 DIAGNOSIS — E782 Mixed hyperlipidemia: Secondary | ICD-10-CM | POA: Diagnosis not present

## 2023-05-19 DIAGNOSIS — I10 Essential (primary) hypertension: Secondary | ICD-10-CM | POA: Diagnosis not present

## 2023-05-19 DIAGNOSIS — G909 Disorder of the autonomic nervous system, unspecified: Secondary | ICD-10-CM | POA: Diagnosis not present

## 2023-05-19 DIAGNOSIS — M069 Rheumatoid arthritis, unspecified: Secondary | ICD-10-CM | POA: Diagnosis not present

## 2023-05-19 DIAGNOSIS — K219 Gastro-esophageal reflux disease without esophagitis: Secondary | ICD-10-CM | POA: Diagnosis not present

## 2023-05-27 DIAGNOSIS — H524 Presbyopia: Secondary | ICD-10-CM | POA: Diagnosis not present

## 2023-05-27 DIAGNOSIS — Z961 Presence of intraocular lens: Secondary | ICD-10-CM | POA: Diagnosis not present

## 2023-06-03 DIAGNOSIS — E119 Type 2 diabetes mellitus without complications: Secondary | ICD-10-CM | POA: Diagnosis not present

## 2023-06-10 DIAGNOSIS — M6281 Muscle weakness (generalized): Secondary | ICD-10-CM | POA: Diagnosis not present

## 2023-06-10 DIAGNOSIS — M24542 Contracture, left hand: Secondary | ICD-10-CM | POA: Diagnosis not present

## 2023-06-11 DIAGNOSIS — M24542 Contracture, left hand: Secondary | ICD-10-CM | POA: Diagnosis not present

## 2023-06-11 DIAGNOSIS — M6281 Muscle weakness (generalized): Secondary | ICD-10-CM | POA: Diagnosis not present

## 2023-06-12 DIAGNOSIS — M6281 Muscle weakness (generalized): Secondary | ICD-10-CM | POA: Diagnosis not present

## 2023-06-12 DIAGNOSIS — M24542 Contracture, left hand: Secondary | ICD-10-CM | POA: Diagnosis not present

## 2023-06-13 DIAGNOSIS — M24542 Contracture, left hand: Secondary | ICD-10-CM | POA: Diagnosis not present

## 2023-06-13 DIAGNOSIS — M6281 Muscle weakness (generalized): Secondary | ICD-10-CM | POA: Diagnosis not present

## 2023-06-16 DIAGNOSIS — M24542 Contracture, left hand: Secondary | ICD-10-CM | POA: Diagnosis not present

## 2023-06-16 DIAGNOSIS — M6281 Muscle weakness (generalized): Secondary | ICD-10-CM | POA: Diagnosis not present

## 2023-06-17 DIAGNOSIS — G629 Polyneuropathy, unspecified: Secondary | ICD-10-CM | POA: Diagnosis not present

## 2023-06-17 DIAGNOSIS — M24542 Contracture, left hand: Secondary | ICD-10-CM | POA: Diagnosis not present

## 2023-06-17 DIAGNOSIS — E785 Hyperlipidemia, unspecified: Secondary | ICD-10-CM | POA: Diagnosis not present

## 2023-06-17 DIAGNOSIS — K219 Gastro-esophageal reflux disease without esophagitis: Secondary | ICD-10-CM | POA: Diagnosis not present

## 2023-06-17 DIAGNOSIS — M6281 Muscle weakness (generalized): Secondary | ICD-10-CM | POA: Diagnosis not present

## 2023-06-17 DIAGNOSIS — G8192 Hemiplegia, unspecified affecting left dominant side: Secondary | ICD-10-CM | POA: Diagnosis not present

## 2023-06-17 DIAGNOSIS — I10 Essential (primary) hypertension: Secondary | ICD-10-CM | POA: Diagnosis not present

## 2023-06-18 DIAGNOSIS — M24542 Contracture, left hand: Secondary | ICD-10-CM | POA: Diagnosis not present

## 2023-06-18 DIAGNOSIS — M6281 Muscle weakness (generalized): Secondary | ICD-10-CM | POA: Diagnosis not present

## 2023-06-19 DIAGNOSIS — M24542 Contracture, left hand: Secondary | ICD-10-CM | POA: Diagnosis not present

## 2023-06-19 DIAGNOSIS — M6281 Muscle weakness (generalized): Secondary | ICD-10-CM | POA: Diagnosis not present

## 2023-07-04 DIAGNOSIS — U071 COVID-19: Secondary | ICD-10-CM | POA: Diagnosis not present

## 2023-07-07 DIAGNOSIS — U071 COVID-19: Secondary | ICD-10-CM | POA: Diagnosis not present

## 2023-07-09 DIAGNOSIS — U071 COVID-19: Secondary | ICD-10-CM | POA: Diagnosis not present

## 2023-07-10 DIAGNOSIS — I10 Essential (primary) hypertension: Secondary | ICD-10-CM | POA: Diagnosis not present

## 2023-07-10 DIAGNOSIS — G909 Disorder of the autonomic nervous system, unspecified: Secondary | ICD-10-CM | POA: Diagnosis not present

## 2023-07-10 DIAGNOSIS — M069 Rheumatoid arthritis, unspecified: Secondary | ICD-10-CM | POA: Diagnosis not present

## 2023-07-10 DIAGNOSIS — I69354 Hemiplegia and hemiparesis following cerebral infarction affecting left non-dominant side: Secondary | ICD-10-CM | POA: Diagnosis not present

## 2023-07-10 DIAGNOSIS — G629 Polyneuropathy, unspecified: Secondary | ICD-10-CM | POA: Diagnosis not present

## 2023-07-10 DIAGNOSIS — E782 Mixed hyperlipidemia: Secondary | ICD-10-CM | POA: Diagnosis not present

## 2023-07-12 DIAGNOSIS — M1990 Unspecified osteoarthritis, unspecified site: Secondary | ICD-10-CM | POA: Diagnosis not present

## 2023-07-12 DIAGNOSIS — I69354 Hemiplegia and hemiparesis following cerebral infarction affecting left non-dominant side: Secondary | ICD-10-CM | POA: Diagnosis not present

## 2023-07-12 DIAGNOSIS — I10 Essential (primary) hypertension: Secondary | ICD-10-CM | POA: Diagnosis not present

## 2023-07-12 DIAGNOSIS — U071 COVID-19: Secondary | ICD-10-CM | POA: Diagnosis not present

## 2023-07-12 DIAGNOSIS — R7303 Prediabetes: Secondary | ICD-10-CM | POA: Diagnosis not present

## 2023-07-12 DIAGNOSIS — G8929 Other chronic pain: Secondary | ICD-10-CM | POA: Diagnosis not present

## 2023-07-12 DIAGNOSIS — B0223 Postherpetic polyneuropathy: Secondary | ICD-10-CM | POA: Diagnosis not present

## 2023-07-12 DIAGNOSIS — G909 Disorder of the autonomic nervous system, unspecified: Secondary | ICD-10-CM | POA: Diagnosis not present

## 2023-07-12 DIAGNOSIS — M069 Rheumatoid arthritis, unspecified: Secondary | ICD-10-CM | POA: Diagnosis not present

## 2023-07-13 DIAGNOSIS — G8929 Other chronic pain: Secondary | ICD-10-CM | POA: Diagnosis not present

## 2023-07-13 DIAGNOSIS — B0223 Postherpetic polyneuropathy: Secondary | ICD-10-CM | POA: Diagnosis not present

## 2023-07-13 DIAGNOSIS — I10 Essential (primary) hypertension: Secondary | ICD-10-CM | POA: Diagnosis not present

## 2023-07-13 DIAGNOSIS — G909 Disorder of the autonomic nervous system, unspecified: Secondary | ICD-10-CM | POA: Diagnosis not present

## 2023-07-13 DIAGNOSIS — M1990 Unspecified osteoarthritis, unspecified site: Secondary | ICD-10-CM | POA: Diagnosis not present

## 2023-07-13 DIAGNOSIS — U071 COVID-19: Secondary | ICD-10-CM | POA: Diagnosis not present

## 2023-07-13 DIAGNOSIS — M069 Rheumatoid arthritis, unspecified: Secondary | ICD-10-CM | POA: Diagnosis not present

## 2023-07-13 DIAGNOSIS — I69354 Hemiplegia and hemiparesis following cerebral infarction affecting left non-dominant side: Secondary | ICD-10-CM | POA: Diagnosis not present

## 2023-07-13 DIAGNOSIS — R7303 Prediabetes: Secondary | ICD-10-CM | POA: Diagnosis not present

## 2023-07-15 DIAGNOSIS — I10 Essential (primary) hypertension: Secondary | ICD-10-CM | POA: Diagnosis not present

## 2023-07-15 DIAGNOSIS — R7303 Prediabetes: Secondary | ICD-10-CM | POA: Diagnosis not present

## 2023-07-15 DIAGNOSIS — B0223 Postherpetic polyneuropathy: Secondary | ICD-10-CM | POA: Diagnosis not present

## 2023-07-15 DIAGNOSIS — G8929 Other chronic pain: Secondary | ICD-10-CM | POA: Diagnosis not present

## 2023-07-15 DIAGNOSIS — G909 Disorder of the autonomic nervous system, unspecified: Secondary | ICD-10-CM | POA: Diagnosis not present

## 2023-07-15 DIAGNOSIS — U071 COVID-19: Secondary | ICD-10-CM | POA: Diagnosis not present

## 2023-07-15 DIAGNOSIS — I69354 Hemiplegia and hemiparesis following cerebral infarction affecting left non-dominant side: Secondary | ICD-10-CM | POA: Diagnosis not present

## 2023-07-15 DIAGNOSIS — M069 Rheumatoid arthritis, unspecified: Secondary | ICD-10-CM | POA: Diagnosis not present

## 2023-07-15 DIAGNOSIS — M1990 Unspecified osteoarthritis, unspecified site: Secondary | ICD-10-CM | POA: Diagnosis not present

## 2023-07-16 DIAGNOSIS — M069 Rheumatoid arthritis, unspecified: Secondary | ICD-10-CM | POA: Diagnosis not present

## 2023-07-16 DIAGNOSIS — B0223 Postherpetic polyneuropathy: Secondary | ICD-10-CM | POA: Diagnosis not present

## 2023-07-16 DIAGNOSIS — R7303 Prediabetes: Secondary | ICD-10-CM | POA: Diagnosis not present

## 2023-07-16 DIAGNOSIS — I10 Essential (primary) hypertension: Secondary | ICD-10-CM | POA: Diagnosis not present

## 2023-07-16 DIAGNOSIS — U071 COVID-19: Secondary | ICD-10-CM | POA: Diagnosis not present

## 2023-07-16 DIAGNOSIS — G909 Disorder of the autonomic nervous system, unspecified: Secondary | ICD-10-CM | POA: Diagnosis not present

## 2023-07-16 DIAGNOSIS — G8929 Other chronic pain: Secondary | ICD-10-CM | POA: Diagnosis not present

## 2023-07-16 DIAGNOSIS — I69354 Hemiplegia and hemiparesis following cerebral infarction affecting left non-dominant side: Secondary | ICD-10-CM | POA: Diagnosis not present

## 2023-07-16 DIAGNOSIS — M1990 Unspecified osteoarthritis, unspecified site: Secondary | ICD-10-CM | POA: Diagnosis not present

## 2023-07-22 DIAGNOSIS — M069 Rheumatoid arthritis, unspecified: Secondary | ICD-10-CM | POA: Diagnosis not present

## 2023-07-22 DIAGNOSIS — G909 Disorder of the autonomic nervous system, unspecified: Secondary | ICD-10-CM | POA: Diagnosis not present

## 2023-07-22 DIAGNOSIS — I69354 Hemiplegia and hemiparesis following cerebral infarction affecting left non-dominant side: Secondary | ICD-10-CM | POA: Diagnosis not present

## 2023-07-22 DIAGNOSIS — I10 Essential (primary) hypertension: Secondary | ICD-10-CM | POA: Diagnosis not present

## 2023-07-22 DIAGNOSIS — R7303 Prediabetes: Secondary | ICD-10-CM | POA: Diagnosis not present

## 2023-07-22 DIAGNOSIS — B0223 Postherpetic polyneuropathy: Secondary | ICD-10-CM | POA: Diagnosis not present

## 2023-07-22 DIAGNOSIS — M1990 Unspecified osteoarthritis, unspecified site: Secondary | ICD-10-CM | POA: Diagnosis not present

## 2023-07-22 DIAGNOSIS — U071 COVID-19: Secondary | ICD-10-CM | POA: Diagnosis not present

## 2023-07-22 DIAGNOSIS — G8929 Other chronic pain: Secondary | ICD-10-CM | POA: Diagnosis not present

## 2023-07-23 DIAGNOSIS — R7303 Prediabetes: Secondary | ICD-10-CM | POA: Diagnosis not present

## 2023-07-23 DIAGNOSIS — I69354 Hemiplegia and hemiparesis following cerebral infarction affecting left non-dominant side: Secondary | ICD-10-CM | POA: Diagnosis not present

## 2023-07-23 DIAGNOSIS — B0223 Postherpetic polyneuropathy: Secondary | ICD-10-CM | POA: Diagnosis not present

## 2023-07-23 DIAGNOSIS — U071 COVID-19: Secondary | ICD-10-CM | POA: Diagnosis not present

## 2023-07-23 DIAGNOSIS — I10 Essential (primary) hypertension: Secondary | ICD-10-CM | POA: Diagnosis not present

## 2023-07-23 DIAGNOSIS — M1990 Unspecified osteoarthritis, unspecified site: Secondary | ICD-10-CM | POA: Diagnosis not present

## 2023-07-23 DIAGNOSIS — G909 Disorder of the autonomic nervous system, unspecified: Secondary | ICD-10-CM | POA: Diagnosis not present

## 2023-07-23 DIAGNOSIS — M069 Rheumatoid arthritis, unspecified: Secondary | ICD-10-CM | POA: Diagnosis not present

## 2023-07-23 DIAGNOSIS — G8929 Other chronic pain: Secondary | ICD-10-CM | POA: Diagnosis not present

## 2023-07-25 DIAGNOSIS — G909 Disorder of the autonomic nervous system, unspecified: Secondary | ICD-10-CM | POA: Diagnosis not present

## 2023-07-25 DIAGNOSIS — I10 Essential (primary) hypertension: Secondary | ICD-10-CM | POA: Diagnosis not present

## 2023-07-25 DIAGNOSIS — I69354 Hemiplegia and hemiparesis following cerebral infarction affecting left non-dominant side: Secondary | ICD-10-CM | POA: Diagnosis not present

## 2023-07-25 DIAGNOSIS — B0223 Postherpetic polyneuropathy: Secondary | ICD-10-CM | POA: Diagnosis not present

## 2023-07-25 DIAGNOSIS — R7303 Prediabetes: Secondary | ICD-10-CM | POA: Diagnosis not present

## 2023-07-25 DIAGNOSIS — G8929 Other chronic pain: Secondary | ICD-10-CM | POA: Diagnosis not present

## 2023-07-25 DIAGNOSIS — M1990 Unspecified osteoarthritis, unspecified site: Secondary | ICD-10-CM | POA: Diagnosis not present

## 2023-07-25 DIAGNOSIS — M069 Rheumatoid arthritis, unspecified: Secondary | ICD-10-CM | POA: Diagnosis not present

## 2023-07-25 DIAGNOSIS — U071 COVID-19: Secondary | ICD-10-CM | POA: Diagnosis not present

## 2023-07-29 DIAGNOSIS — B0223 Postherpetic polyneuropathy: Secondary | ICD-10-CM | POA: Diagnosis not present

## 2023-07-29 DIAGNOSIS — M1990 Unspecified osteoarthritis, unspecified site: Secondary | ICD-10-CM | POA: Diagnosis not present

## 2023-07-29 DIAGNOSIS — G909 Disorder of the autonomic nervous system, unspecified: Secondary | ICD-10-CM | POA: Diagnosis not present

## 2023-07-29 DIAGNOSIS — M069 Rheumatoid arthritis, unspecified: Secondary | ICD-10-CM | POA: Diagnosis not present

## 2023-07-29 DIAGNOSIS — G8929 Other chronic pain: Secondary | ICD-10-CM | POA: Diagnosis not present

## 2023-07-29 DIAGNOSIS — I10 Essential (primary) hypertension: Secondary | ICD-10-CM | POA: Diagnosis not present

## 2023-07-29 DIAGNOSIS — U071 COVID-19: Secondary | ICD-10-CM | POA: Diagnosis not present

## 2023-07-29 DIAGNOSIS — R7303 Prediabetes: Secondary | ICD-10-CM | POA: Diagnosis not present

## 2023-07-29 DIAGNOSIS — I69354 Hemiplegia and hemiparesis following cerebral infarction affecting left non-dominant side: Secondary | ICD-10-CM | POA: Diagnosis not present

## 2023-07-30 DIAGNOSIS — M1990 Unspecified osteoarthritis, unspecified site: Secondary | ICD-10-CM | POA: Diagnosis not present

## 2023-07-30 DIAGNOSIS — M069 Rheumatoid arthritis, unspecified: Secondary | ICD-10-CM | POA: Diagnosis not present

## 2023-07-30 DIAGNOSIS — G8929 Other chronic pain: Secondary | ICD-10-CM | POA: Diagnosis not present

## 2023-07-30 DIAGNOSIS — I10 Essential (primary) hypertension: Secondary | ICD-10-CM | POA: Diagnosis not present

## 2023-07-30 DIAGNOSIS — I69354 Hemiplegia and hemiparesis following cerebral infarction affecting left non-dominant side: Secondary | ICD-10-CM | POA: Diagnosis not present

## 2023-07-30 DIAGNOSIS — G909 Disorder of the autonomic nervous system, unspecified: Secondary | ICD-10-CM | POA: Diagnosis not present

## 2023-07-30 DIAGNOSIS — B0223 Postherpetic polyneuropathy: Secondary | ICD-10-CM | POA: Diagnosis not present

## 2023-07-30 DIAGNOSIS — U071 COVID-19: Secondary | ICD-10-CM | POA: Diagnosis not present

## 2023-07-30 DIAGNOSIS — R7303 Prediabetes: Secondary | ICD-10-CM | POA: Diagnosis not present

## 2023-07-31 DIAGNOSIS — U071 COVID-19: Secondary | ICD-10-CM | POA: Diagnosis not present

## 2023-07-31 DIAGNOSIS — I69354 Hemiplegia and hemiparesis following cerebral infarction affecting left non-dominant side: Secondary | ICD-10-CM | POA: Diagnosis not present

## 2023-07-31 DIAGNOSIS — M1990 Unspecified osteoarthritis, unspecified site: Secondary | ICD-10-CM | POA: Diagnosis not present

## 2023-07-31 DIAGNOSIS — R7303 Prediabetes: Secondary | ICD-10-CM | POA: Diagnosis not present

## 2023-07-31 DIAGNOSIS — M069 Rheumatoid arthritis, unspecified: Secondary | ICD-10-CM | POA: Diagnosis not present

## 2023-07-31 DIAGNOSIS — I10 Essential (primary) hypertension: Secondary | ICD-10-CM | POA: Diagnosis not present

## 2023-07-31 DIAGNOSIS — G909 Disorder of the autonomic nervous system, unspecified: Secondary | ICD-10-CM | POA: Diagnosis not present

## 2023-07-31 DIAGNOSIS — G8929 Other chronic pain: Secondary | ICD-10-CM | POA: Diagnosis not present

## 2023-07-31 DIAGNOSIS — B0223 Postherpetic polyneuropathy: Secondary | ICD-10-CM | POA: Diagnosis not present

## 2023-08-01 DIAGNOSIS — G8929 Other chronic pain: Secondary | ICD-10-CM | POA: Diagnosis not present

## 2023-08-01 DIAGNOSIS — R7303 Prediabetes: Secondary | ICD-10-CM | POA: Diagnosis not present

## 2023-08-01 DIAGNOSIS — M069 Rheumatoid arthritis, unspecified: Secondary | ICD-10-CM | POA: Diagnosis not present

## 2023-08-01 DIAGNOSIS — B0223 Postherpetic polyneuropathy: Secondary | ICD-10-CM | POA: Diagnosis not present

## 2023-08-01 DIAGNOSIS — G909 Disorder of the autonomic nervous system, unspecified: Secondary | ICD-10-CM | POA: Diagnosis not present

## 2023-08-01 DIAGNOSIS — M1990 Unspecified osteoarthritis, unspecified site: Secondary | ICD-10-CM | POA: Diagnosis not present

## 2023-08-01 DIAGNOSIS — U071 COVID-19: Secondary | ICD-10-CM | POA: Diagnosis not present

## 2023-08-01 DIAGNOSIS — I10 Essential (primary) hypertension: Secondary | ICD-10-CM | POA: Diagnosis not present

## 2023-08-01 DIAGNOSIS — I69354 Hemiplegia and hemiparesis following cerebral infarction affecting left non-dominant side: Secondary | ICD-10-CM | POA: Diagnosis not present

## 2023-08-04 DIAGNOSIS — L4059 Other psoriatic arthropathy: Secondary | ICD-10-CM | POA: Diagnosis not present

## 2023-08-05 DIAGNOSIS — D84821 Immunodeficiency due to drugs: Secondary | ICD-10-CM | POA: Diagnosis not present

## 2023-08-05 DIAGNOSIS — K219 Gastro-esophageal reflux disease without esophagitis: Secondary | ICD-10-CM | POA: Diagnosis not present

## 2023-08-05 DIAGNOSIS — M069 Rheumatoid arthritis, unspecified: Secondary | ICD-10-CM | POA: Diagnosis not present

## 2023-08-05 DIAGNOSIS — Z79631 Long term (current) use of antimetabolite agent: Secondary | ICD-10-CM | POA: Diagnosis not present

## 2023-08-05 DIAGNOSIS — I10 Essential (primary) hypertension: Secondary | ICD-10-CM | POA: Diagnosis not present

## 2023-08-05 DIAGNOSIS — L2081 Atopic neurodermatitis: Secondary | ICD-10-CM | POA: Diagnosis not present

## 2023-08-05 DIAGNOSIS — B0223 Postherpetic polyneuropathy: Secondary | ICD-10-CM | POA: Diagnosis not present

## 2023-08-05 DIAGNOSIS — U071 COVID-19: Secondary | ICD-10-CM | POA: Diagnosis not present

## 2023-08-05 DIAGNOSIS — J019 Acute sinusitis, unspecified: Secondary | ICD-10-CM | POA: Diagnosis not present

## 2023-08-05 DIAGNOSIS — G8929 Other chronic pain: Secondary | ICD-10-CM | POA: Diagnosis not present

## 2023-08-05 DIAGNOSIS — M1990 Unspecified osteoarthritis, unspecified site: Secondary | ICD-10-CM | POA: Diagnosis not present

## 2023-08-05 DIAGNOSIS — G909 Disorder of the autonomic nervous system, unspecified: Secondary | ICD-10-CM | POA: Diagnosis not present

## 2023-08-05 DIAGNOSIS — I69354 Hemiplegia and hemiparesis following cerebral infarction affecting left non-dominant side: Secondary | ICD-10-CM | POA: Diagnosis not present

## 2023-08-05 DIAGNOSIS — R7303 Prediabetes: Secondary | ICD-10-CM | POA: Diagnosis not present

## 2023-08-07 DIAGNOSIS — I10 Essential (primary) hypertension: Secondary | ICD-10-CM | POA: Diagnosis not present

## 2023-08-07 DIAGNOSIS — B0223 Postherpetic polyneuropathy: Secondary | ICD-10-CM | POA: Diagnosis not present

## 2023-08-07 DIAGNOSIS — G909 Disorder of the autonomic nervous system, unspecified: Secondary | ICD-10-CM | POA: Diagnosis not present

## 2023-08-07 DIAGNOSIS — M069 Rheumatoid arthritis, unspecified: Secondary | ICD-10-CM | POA: Diagnosis not present

## 2023-08-07 DIAGNOSIS — R7303 Prediabetes: Secondary | ICD-10-CM | POA: Diagnosis not present

## 2023-08-07 DIAGNOSIS — G8929 Other chronic pain: Secondary | ICD-10-CM | POA: Diagnosis not present

## 2023-08-07 DIAGNOSIS — I69354 Hemiplegia and hemiparesis following cerebral infarction affecting left non-dominant side: Secondary | ICD-10-CM | POA: Diagnosis not present

## 2023-08-07 DIAGNOSIS — U071 COVID-19: Secondary | ICD-10-CM | POA: Diagnosis not present

## 2023-08-07 DIAGNOSIS — M1990 Unspecified osteoarthritis, unspecified site: Secondary | ICD-10-CM | POA: Diagnosis not present

## 2023-08-08 DIAGNOSIS — B0223 Postherpetic polyneuropathy: Secondary | ICD-10-CM | POA: Diagnosis not present

## 2023-08-08 DIAGNOSIS — G909 Disorder of the autonomic nervous system, unspecified: Secondary | ICD-10-CM | POA: Diagnosis not present

## 2023-08-08 DIAGNOSIS — G8929 Other chronic pain: Secondary | ICD-10-CM | POA: Diagnosis not present

## 2023-08-08 DIAGNOSIS — M1990 Unspecified osteoarthritis, unspecified site: Secondary | ICD-10-CM | POA: Diagnosis not present

## 2023-08-08 DIAGNOSIS — I69354 Hemiplegia and hemiparesis following cerebral infarction affecting left non-dominant side: Secondary | ICD-10-CM | POA: Diagnosis not present

## 2023-08-08 DIAGNOSIS — U071 COVID-19: Secondary | ICD-10-CM | POA: Diagnosis not present

## 2023-08-08 DIAGNOSIS — I10 Essential (primary) hypertension: Secondary | ICD-10-CM | POA: Diagnosis not present

## 2023-08-08 DIAGNOSIS — M069 Rheumatoid arthritis, unspecified: Secondary | ICD-10-CM | POA: Diagnosis not present

## 2023-08-08 DIAGNOSIS — R7303 Prediabetes: Secondary | ICD-10-CM | POA: Diagnosis not present

## 2023-08-14 DIAGNOSIS — R7303 Prediabetes: Secondary | ICD-10-CM | POA: Diagnosis not present

## 2023-08-14 DIAGNOSIS — G909 Disorder of the autonomic nervous system, unspecified: Secondary | ICD-10-CM | POA: Diagnosis not present

## 2023-08-14 DIAGNOSIS — B0223 Postherpetic polyneuropathy: Secondary | ICD-10-CM | POA: Diagnosis not present

## 2023-08-14 DIAGNOSIS — I69354 Hemiplegia and hemiparesis following cerebral infarction affecting left non-dominant side: Secondary | ICD-10-CM | POA: Diagnosis not present

## 2023-08-14 DIAGNOSIS — I10 Essential (primary) hypertension: Secondary | ICD-10-CM | POA: Diagnosis not present

## 2023-08-14 DIAGNOSIS — G8929 Other chronic pain: Secondary | ICD-10-CM | POA: Diagnosis not present

## 2023-08-14 DIAGNOSIS — U071 COVID-19: Secondary | ICD-10-CM | POA: Diagnosis not present

## 2023-08-14 DIAGNOSIS — M1990 Unspecified osteoarthritis, unspecified site: Secondary | ICD-10-CM | POA: Diagnosis not present

## 2023-08-14 DIAGNOSIS — M069 Rheumatoid arthritis, unspecified: Secondary | ICD-10-CM | POA: Diagnosis not present

## 2023-08-15 DIAGNOSIS — M1990 Unspecified osteoarthritis, unspecified site: Secondary | ICD-10-CM | POA: Diagnosis not present

## 2023-08-15 DIAGNOSIS — G909 Disorder of the autonomic nervous system, unspecified: Secondary | ICD-10-CM | POA: Diagnosis not present

## 2023-08-15 DIAGNOSIS — R7303 Prediabetes: Secondary | ICD-10-CM | POA: Diagnosis not present

## 2023-08-15 DIAGNOSIS — I10 Essential (primary) hypertension: Secondary | ICD-10-CM | POA: Diagnosis not present

## 2023-08-15 DIAGNOSIS — M069 Rheumatoid arthritis, unspecified: Secondary | ICD-10-CM | POA: Diagnosis not present

## 2023-08-15 DIAGNOSIS — U071 COVID-19: Secondary | ICD-10-CM | POA: Diagnosis not present

## 2023-08-15 DIAGNOSIS — B0223 Postherpetic polyneuropathy: Secondary | ICD-10-CM | POA: Diagnosis not present

## 2023-08-15 DIAGNOSIS — I69354 Hemiplegia and hemiparesis following cerebral infarction affecting left non-dominant side: Secondary | ICD-10-CM | POA: Diagnosis not present

## 2023-08-15 DIAGNOSIS — G8929 Other chronic pain: Secondary | ICD-10-CM | POA: Diagnosis not present

## 2023-08-21 DIAGNOSIS — U071 COVID-19: Secondary | ICD-10-CM | POA: Diagnosis not present

## 2023-08-21 DIAGNOSIS — G909 Disorder of the autonomic nervous system, unspecified: Secondary | ICD-10-CM | POA: Diagnosis not present

## 2023-08-21 DIAGNOSIS — I10 Essential (primary) hypertension: Secondary | ICD-10-CM | POA: Diagnosis not present

## 2023-08-21 DIAGNOSIS — R7303 Prediabetes: Secondary | ICD-10-CM | POA: Diagnosis not present

## 2023-08-21 DIAGNOSIS — M069 Rheumatoid arthritis, unspecified: Secondary | ICD-10-CM | POA: Diagnosis not present

## 2023-08-21 DIAGNOSIS — I69354 Hemiplegia and hemiparesis following cerebral infarction affecting left non-dominant side: Secondary | ICD-10-CM | POA: Diagnosis not present

## 2023-08-21 DIAGNOSIS — G8929 Other chronic pain: Secondary | ICD-10-CM | POA: Diagnosis not present

## 2023-08-21 DIAGNOSIS — M1990 Unspecified osteoarthritis, unspecified site: Secondary | ICD-10-CM | POA: Diagnosis not present

## 2023-08-21 DIAGNOSIS — B0223 Postherpetic polyneuropathy: Secondary | ICD-10-CM | POA: Diagnosis not present

## 2023-08-23 DIAGNOSIS — I69354 Hemiplegia and hemiparesis following cerebral infarction affecting left non-dominant side: Secondary | ICD-10-CM | POA: Diagnosis not present

## 2023-08-23 DIAGNOSIS — G909 Disorder of the autonomic nervous system, unspecified: Secondary | ICD-10-CM | POA: Diagnosis not present

## 2023-08-23 DIAGNOSIS — G8929 Other chronic pain: Secondary | ICD-10-CM | POA: Diagnosis not present

## 2023-08-23 DIAGNOSIS — M069 Rheumatoid arthritis, unspecified: Secondary | ICD-10-CM | POA: Diagnosis not present

## 2023-08-23 DIAGNOSIS — B0223 Postherpetic polyneuropathy: Secondary | ICD-10-CM | POA: Diagnosis not present

## 2023-08-23 DIAGNOSIS — M1990 Unspecified osteoarthritis, unspecified site: Secondary | ICD-10-CM | POA: Diagnosis not present

## 2023-08-23 DIAGNOSIS — R7303 Prediabetes: Secondary | ICD-10-CM | POA: Diagnosis not present

## 2023-08-23 DIAGNOSIS — U071 COVID-19: Secondary | ICD-10-CM | POA: Diagnosis not present

## 2023-08-23 DIAGNOSIS — I10 Essential (primary) hypertension: Secondary | ICD-10-CM | POA: Diagnosis not present

## 2023-08-27 DIAGNOSIS — R7303 Prediabetes: Secondary | ICD-10-CM | POA: Diagnosis not present

## 2023-08-27 DIAGNOSIS — M1990 Unspecified osteoarthritis, unspecified site: Secondary | ICD-10-CM | POA: Diagnosis not present

## 2023-08-27 DIAGNOSIS — B0223 Postherpetic polyneuropathy: Secondary | ICD-10-CM | POA: Diagnosis not present

## 2023-08-27 DIAGNOSIS — G8929 Other chronic pain: Secondary | ICD-10-CM | POA: Diagnosis not present

## 2023-08-27 DIAGNOSIS — M069 Rheumatoid arthritis, unspecified: Secondary | ICD-10-CM | POA: Diagnosis not present

## 2023-08-27 DIAGNOSIS — I69359 Hemiplegia and hemiparesis following cerebral infarction affecting unspecified side: Secondary | ICD-10-CM | POA: Diagnosis not present

## 2023-08-27 DIAGNOSIS — R32 Unspecified urinary incontinence: Secondary | ICD-10-CM | POA: Diagnosis not present

## 2023-08-27 DIAGNOSIS — G909 Disorder of the autonomic nervous system, unspecified: Secondary | ICD-10-CM | POA: Diagnosis not present

## 2023-08-27 DIAGNOSIS — I69354 Hemiplegia and hemiparesis following cerebral infarction affecting left non-dominant side: Secondary | ICD-10-CM | POA: Diagnosis not present

## 2023-08-27 DIAGNOSIS — I10 Essential (primary) hypertension: Secondary | ICD-10-CM | POA: Diagnosis not present

## 2023-08-27 DIAGNOSIS — U071 COVID-19: Secondary | ICD-10-CM | POA: Diagnosis not present

## 2023-08-28 DIAGNOSIS — M1990 Unspecified osteoarthritis, unspecified site: Secondary | ICD-10-CM | POA: Diagnosis not present

## 2023-08-28 DIAGNOSIS — G8929 Other chronic pain: Secondary | ICD-10-CM | POA: Diagnosis not present

## 2023-08-28 DIAGNOSIS — B0223 Postherpetic polyneuropathy: Secondary | ICD-10-CM | POA: Diagnosis not present

## 2023-08-28 DIAGNOSIS — G909 Disorder of the autonomic nervous system, unspecified: Secondary | ICD-10-CM | POA: Diagnosis not present

## 2023-08-28 DIAGNOSIS — U071 COVID-19: Secondary | ICD-10-CM | POA: Diagnosis not present

## 2023-08-28 DIAGNOSIS — R7303 Prediabetes: Secondary | ICD-10-CM | POA: Diagnosis not present

## 2023-08-28 DIAGNOSIS — I69354 Hemiplegia and hemiparesis following cerebral infarction affecting left non-dominant side: Secondary | ICD-10-CM | POA: Diagnosis not present

## 2023-08-28 DIAGNOSIS — I10 Essential (primary) hypertension: Secondary | ICD-10-CM | POA: Diagnosis not present

## 2023-08-28 DIAGNOSIS — M069 Rheumatoid arthritis, unspecified: Secondary | ICD-10-CM | POA: Diagnosis not present

## 2023-09-02 DIAGNOSIS — I69354 Hemiplegia and hemiparesis following cerebral infarction affecting left non-dominant side: Secondary | ICD-10-CM | POA: Diagnosis not present

## 2023-09-02 DIAGNOSIS — G909 Disorder of the autonomic nervous system, unspecified: Secondary | ICD-10-CM | POA: Diagnosis not present

## 2023-09-02 DIAGNOSIS — B0223 Postherpetic polyneuropathy: Secondary | ICD-10-CM | POA: Diagnosis not present

## 2023-09-02 DIAGNOSIS — M1990 Unspecified osteoarthritis, unspecified site: Secondary | ICD-10-CM | POA: Diagnosis not present

## 2023-09-02 DIAGNOSIS — M069 Rheumatoid arthritis, unspecified: Secondary | ICD-10-CM | POA: Diagnosis not present

## 2023-09-02 DIAGNOSIS — G8929 Other chronic pain: Secondary | ICD-10-CM | POA: Diagnosis not present

## 2023-09-02 DIAGNOSIS — I10 Essential (primary) hypertension: Secondary | ICD-10-CM | POA: Diagnosis not present

## 2023-09-02 DIAGNOSIS — R7303 Prediabetes: Secondary | ICD-10-CM | POA: Diagnosis not present

## 2023-09-02 DIAGNOSIS — U071 COVID-19: Secondary | ICD-10-CM | POA: Diagnosis not present

## 2023-09-04 DIAGNOSIS — U071 COVID-19: Secondary | ICD-10-CM | POA: Diagnosis not present

## 2023-09-04 DIAGNOSIS — G909 Disorder of the autonomic nervous system, unspecified: Secondary | ICD-10-CM | POA: Diagnosis not present

## 2023-09-04 DIAGNOSIS — I10 Essential (primary) hypertension: Secondary | ICD-10-CM | POA: Diagnosis not present

## 2023-09-04 DIAGNOSIS — R7303 Prediabetes: Secondary | ICD-10-CM | POA: Diagnosis not present

## 2023-09-04 DIAGNOSIS — B0223 Postherpetic polyneuropathy: Secondary | ICD-10-CM | POA: Diagnosis not present

## 2023-09-04 DIAGNOSIS — G8929 Other chronic pain: Secondary | ICD-10-CM | POA: Diagnosis not present

## 2023-09-04 DIAGNOSIS — I69354 Hemiplegia and hemiparesis following cerebral infarction affecting left non-dominant side: Secondary | ICD-10-CM | POA: Diagnosis not present

## 2023-09-04 DIAGNOSIS — M1990 Unspecified osteoarthritis, unspecified site: Secondary | ICD-10-CM | POA: Diagnosis not present

## 2023-09-04 DIAGNOSIS — M069 Rheumatoid arthritis, unspecified: Secondary | ICD-10-CM | POA: Diagnosis not present

## 2023-09-05 DIAGNOSIS — M069 Rheumatoid arthritis, unspecified: Secondary | ICD-10-CM | POA: Diagnosis not present

## 2023-09-05 DIAGNOSIS — G909 Disorder of the autonomic nervous system, unspecified: Secondary | ICD-10-CM | POA: Diagnosis not present

## 2023-09-05 DIAGNOSIS — R7303 Prediabetes: Secondary | ICD-10-CM | POA: Diagnosis not present

## 2023-09-05 DIAGNOSIS — B0223 Postherpetic polyneuropathy: Secondary | ICD-10-CM | POA: Diagnosis not present

## 2023-09-05 DIAGNOSIS — I10 Essential (primary) hypertension: Secondary | ICD-10-CM | POA: Diagnosis not present

## 2023-09-05 DIAGNOSIS — M1990 Unspecified osteoarthritis, unspecified site: Secondary | ICD-10-CM | POA: Diagnosis not present

## 2023-09-05 DIAGNOSIS — U071 COVID-19: Secondary | ICD-10-CM | POA: Diagnosis not present

## 2023-09-05 DIAGNOSIS — G8929 Other chronic pain: Secondary | ICD-10-CM | POA: Diagnosis not present

## 2023-09-05 DIAGNOSIS — I69354 Hemiplegia and hemiparesis following cerebral infarction affecting left non-dominant side: Secondary | ICD-10-CM | POA: Diagnosis not present

## 2023-09-08 DIAGNOSIS — M1990 Unspecified osteoarthritis, unspecified site: Secondary | ICD-10-CM | POA: Diagnosis not present

## 2023-09-08 DIAGNOSIS — I10 Essential (primary) hypertension: Secondary | ICD-10-CM | POA: Diagnosis not present

## 2023-09-08 DIAGNOSIS — B0223 Postherpetic polyneuropathy: Secondary | ICD-10-CM | POA: Diagnosis not present

## 2023-09-08 DIAGNOSIS — M069 Rheumatoid arthritis, unspecified: Secondary | ICD-10-CM | POA: Diagnosis not present

## 2023-09-08 DIAGNOSIS — U071 COVID-19: Secondary | ICD-10-CM | POA: Diagnosis not present

## 2023-09-08 DIAGNOSIS — G8929 Other chronic pain: Secondary | ICD-10-CM | POA: Diagnosis not present

## 2023-09-08 DIAGNOSIS — I69354 Hemiplegia and hemiparesis following cerebral infarction affecting left non-dominant side: Secondary | ICD-10-CM | POA: Diagnosis not present

## 2023-09-08 DIAGNOSIS — G909 Disorder of the autonomic nervous system, unspecified: Secondary | ICD-10-CM | POA: Diagnosis not present

## 2023-09-08 DIAGNOSIS — R7303 Prediabetes: Secondary | ICD-10-CM | POA: Diagnosis not present

## 2023-09-09 DIAGNOSIS — I10 Essential (primary) hypertension: Secondary | ICD-10-CM | POA: Diagnosis not present

## 2023-09-09 DIAGNOSIS — M069 Rheumatoid arthritis, unspecified: Secondary | ICD-10-CM | POA: Diagnosis not present

## 2023-09-09 DIAGNOSIS — U071 COVID-19: Secondary | ICD-10-CM | POA: Diagnosis not present

## 2023-09-09 DIAGNOSIS — I69354 Hemiplegia and hemiparesis following cerebral infarction affecting left non-dominant side: Secondary | ICD-10-CM | POA: Diagnosis not present

## 2023-09-09 DIAGNOSIS — G909 Disorder of the autonomic nervous system, unspecified: Secondary | ICD-10-CM | POA: Diagnosis not present

## 2023-09-09 DIAGNOSIS — R7303 Prediabetes: Secondary | ICD-10-CM | POA: Diagnosis not present

## 2023-09-09 DIAGNOSIS — B0223 Postherpetic polyneuropathy: Secondary | ICD-10-CM | POA: Diagnosis not present

## 2023-09-09 DIAGNOSIS — G8929 Other chronic pain: Secondary | ICD-10-CM | POA: Diagnosis not present

## 2023-09-09 DIAGNOSIS — M1990 Unspecified osteoarthritis, unspecified site: Secondary | ICD-10-CM | POA: Diagnosis not present

## 2023-09-10 DIAGNOSIS — G909 Disorder of the autonomic nervous system, unspecified: Secondary | ICD-10-CM | POA: Diagnosis not present

## 2023-09-10 DIAGNOSIS — M069 Rheumatoid arthritis, unspecified: Secondary | ICD-10-CM | POA: Diagnosis not present

## 2023-09-10 DIAGNOSIS — D692 Other nonthrombocytopenic purpura: Secondary | ICD-10-CM | POA: Diagnosis not present

## 2023-09-10 DIAGNOSIS — M1990 Unspecified osteoarthritis, unspecified site: Secondary | ICD-10-CM | POA: Diagnosis not present

## 2023-09-10 DIAGNOSIS — I10 Essential (primary) hypertension: Secondary | ICD-10-CM | POA: Diagnosis not present

## 2023-09-10 DIAGNOSIS — I69354 Hemiplegia and hemiparesis following cerebral infarction affecting left non-dominant side: Secondary | ICD-10-CM | POA: Diagnosis not present

## 2023-09-10 DIAGNOSIS — B0223 Postherpetic polyneuropathy: Secondary | ICD-10-CM | POA: Diagnosis not present

## 2023-09-10 DIAGNOSIS — G8929 Other chronic pain: Secondary | ICD-10-CM | POA: Diagnosis not present

## 2023-09-10 DIAGNOSIS — R7303 Prediabetes: Secondary | ICD-10-CM | POA: Diagnosis not present

## 2023-09-11 DIAGNOSIS — M069 Rheumatoid arthritis, unspecified: Secondary | ICD-10-CM | POA: Diagnosis not present

## 2023-09-11 DIAGNOSIS — R7303 Prediabetes: Secondary | ICD-10-CM | POA: Diagnosis not present

## 2023-09-11 DIAGNOSIS — I69354 Hemiplegia and hemiparesis following cerebral infarction affecting left non-dominant side: Secondary | ICD-10-CM | POA: Diagnosis not present

## 2023-09-11 DIAGNOSIS — G909 Disorder of the autonomic nervous system, unspecified: Secondary | ICD-10-CM | POA: Diagnosis not present

## 2023-09-11 DIAGNOSIS — M1990 Unspecified osteoarthritis, unspecified site: Secondary | ICD-10-CM | POA: Diagnosis not present

## 2023-09-11 DIAGNOSIS — G8929 Other chronic pain: Secondary | ICD-10-CM | POA: Diagnosis not present

## 2023-09-11 DIAGNOSIS — B0223 Postherpetic polyneuropathy: Secondary | ICD-10-CM | POA: Diagnosis not present

## 2023-09-11 DIAGNOSIS — I10 Essential (primary) hypertension: Secondary | ICD-10-CM | POA: Diagnosis not present

## 2023-09-11 DIAGNOSIS — D692 Other nonthrombocytopenic purpura: Secondary | ICD-10-CM | POA: Diagnosis not present

## 2023-09-12 DIAGNOSIS — G8929 Other chronic pain: Secondary | ICD-10-CM | POA: Diagnosis not present

## 2023-09-12 DIAGNOSIS — D692 Other nonthrombocytopenic purpura: Secondary | ICD-10-CM | POA: Diagnosis not present

## 2023-09-12 DIAGNOSIS — M1990 Unspecified osteoarthritis, unspecified site: Secondary | ICD-10-CM | POA: Diagnosis not present

## 2023-09-12 DIAGNOSIS — I69354 Hemiplegia and hemiparesis following cerebral infarction affecting left non-dominant side: Secondary | ICD-10-CM | POA: Diagnosis not present

## 2023-09-12 DIAGNOSIS — G909 Disorder of the autonomic nervous system, unspecified: Secondary | ICD-10-CM | POA: Diagnosis not present

## 2023-09-12 DIAGNOSIS — M069 Rheumatoid arthritis, unspecified: Secondary | ICD-10-CM | POA: Diagnosis not present

## 2023-09-12 DIAGNOSIS — R7303 Prediabetes: Secondary | ICD-10-CM | POA: Diagnosis not present

## 2023-09-12 DIAGNOSIS — B0223 Postherpetic polyneuropathy: Secondary | ICD-10-CM | POA: Diagnosis not present

## 2023-09-12 DIAGNOSIS — I10 Essential (primary) hypertension: Secondary | ICD-10-CM | POA: Diagnosis not present

## 2023-09-16 DIAGNOSIS — G8929 Other chronic pain: Secondary | ICD-10-CM | POA: Diagnosis not present

## 2023-09-16 DIAGNOSIS — I10 Essential (primary) hypertension: Secondary | ICD-10-CM | POA: Diagnosis not present

## 2023-09-16 DIAGNOSIS — I69354 Hemiplegia and hemiparesis following cerebral infarction affecting left non-dominant side: Secondary | ICD-10-CM | POA: Diagnosis not present

## 2023-09-16 DIAGNOSIS — M1990 Unspecified osteoarthritis, unspecified site: Secondary | ICD-10-CM | POA: Diagnosis not present

## 2023-09-16 DIAGNOSIS — M069 Rheumatoid arthritis, unspecified: Secondary | ICD-10-CM | POA: Diagnosis not present

## 2023-09-16 DIAGNOSIS — G909 Disorder of the autonomic nervous system, unspecified: Secondary | ICD-10-CM | POA: Diagnosis not present

## 2023-09-16 DIAGNOSIS — D692 Other nonthrombocytopenic purpura: Secondary | ICD-10-CM | POA: Diagnosis not present

## 2023-09-16 DIAGNOSIS — B0223 Postherpetic polyneuropathy: Secondary | ICD-10-CM | POA: Diagnosis not present

## 2023-09-16 DIAGNOSIS — R7303 Prediabetes: Secondary | ICD-10-CM | POA: Diagnosis not present

## 2023-09-19 DIAGNOSIS — M1990 Unspecified osteoarthritis, unspecified site: Secondary | ICD-10-CM | POA: Diagnosis not present

## 2023-09-19 DIAGNOSIS — I69354 Hemiplegia and hemiparesis following cerebral infarction affecting left non-dominant side: Secondary | ICD-10-CM | POA: Diagnosis not present

## 2023-09-19 DIAGNOSIS — D692 Other nonthrombocytopenic purpura: Secondary | ICD-10-CM | POA: Diagnosis not present

## 2023-09-19 DIAGNOSIS — I10 Essential (primary) hypertension: Secondary | ICD-10-CM | POA: Diagnosis not present

## 2023-09-19 DIAGNOSIS — R7303 Prediabetes: Secondary | ICD-10-CM | POA: Diagnosis not present

## 2023-09-19 DIAGNOSIS — G8929 Other chronic pain: Secondary | ICD-10-CM | POA: Diagnosis not present

## 2023-09-19 DIAGNOSIS — G909 Disorder of the autonomic nervous system, unspecified: Secondary | ICD-10-CM | POA: Diagnosis not present

## 2023-09-19 DIAGNOSIS — B0223 Postherpetic polyneuropathy: Secondary | ICD-10-CM | POA: Diagnosis not present

## 2023-09-19 DIAGNOSIS — M069 Rheumatoid arthritis, unspecified: Secondary | ICD-10-CM | POA: Diagnosis not present

## 2023-09-24 DIAGNOSIS — I69354 Hemiplegia and hemiparesis following cerebral infarction affecting left non-dominant side: Secondary | ICD-10-CM | POA: Diagnosis not present

## 2023-09-24 DIAGNOSIS — B0223 Postherpetic polyneuropathy: Secondary | ICD-10-CM | POA: Diagnosis not present

## 2023-09-24 DIAGNOSIS — R7303 Prediabetes: Secondary | ICD-10-CM | POA: Diagnosis not present

## 2023-09-24 DIAGNOSIS — I10 Essential (primary) hypertension: Secondary | ICD-10-CM | POA: Diagnosis not present

## 2023-09-24 DIAGNOSIS — M069 Rheumatoid arthritis, unspecified: Secondary | ICD-10-CM | POA: Diagnosis not present

## 2023-09-24 DIAGNOSIS — D692 Other nonthrombocytopenic purpura: Secondary | ICD-10-CM | POA: Diagnosis not present

## 2023-09-24 DIAGNOSIS — M1990 Unspecified osteoarthritis, unspecified site: Secondary | ICD-10-CM | POA: Diagnosis not present

## 2023-09-24 DIAGNOSIS — G909 Disorder of the autonomic nervous system, unspecified: Secondary | ICD-10-CM | POA: Diagnosis not present

## 2023-09-24 DIAGNOSIS — G8929 Other chronic pain: Secondary | ICD-10-CM | POA: Diagnosis not present

## 2023-09-26 DIAGNOSIS — R32 Unspecified urinary incontinence: Secondary | ICD-10-CM | POA: Diagnosis not present

## 2023-09-26 DIAGNOSIS — I69359 Hemiplegia and hemiparesis following cerebral infarction affecting unspecified side: Secondary | ICD-10-CM | POA: Diagnosis not present

## 2023-09-29 DIAGNOSIS — B0223 Postherpetic polyneuropathy: Secondary | ICD-10-CM | POA: Diagnosis not present

## 2023-09-29 DIAGNOSIS — I69354 Hemiplegia and hemiparesis following cerebral infarction affecting left non-dominant side: Secondary | ICD-10-CM | POA: Diagnosis not present

## 2023-09-29 DIAGNOSIS — I10 Essential (primary) hypertension: Secondary | ICD-10-CM | POA: Diagnosis not present

## 2023-09-29 DIAGNOSIS — R7303 Prediabetes: Secondary | ICD-10-CM | POA: Diagnosis not present

## 2023-09-29 DIAGNOSIS — G909 Disorder of the autonomic nervous system, unspecified: Secondary | ICD-10-CM | POA: Diagnosis not present

## 2023-09-29 DIAGNOSIS — M1990 Unspecified osteoarthritis, unspecified site: Secondary | ICD-10-CM | POA: Diagnosis not present

## 2023-09-29 DIAGNOSIS — G8929 Other chronic pain: Secondary | ICD-10-CM | POA: Diagnosis not present

## 2023-09-29 DIAGNOSIS — M069 Rheumatoid arthritis, unspecified: Secondary | ICD-10-CM | POA: Diagnosis not present

## 2023-09-29 DIAGNOSIS — D692 Other nonthrombocytopenic purpura: Secondary | ICD-10-CM | POA: Diagnosis not present

## 2023-09-30 DIAGNOSIS — G8929 Other chronic pain: Secondary | ICD-10-CM | POA: Diagnosis not present

## 2023-09-30 DIAGNOSIS — B0223 Postherpetic polyneuropathy: Secondary | ICD-10-CM | POA: Diagnosis not present

## 2023-09-30 DIAGNOSIS — G909 Disorder of the autonomic nervous system, unspecified: Secondary | ICD-10-CM | POA: Diagnosis not present

## 2023-09-30 DIAGNOSIS — D692 Other nonthrombocytopenic purpura: Secondary | ICD-10-CM | POA: Diagnosis not present

## 2023-09-30 DIAGNOSIS — R7303 Prediabetes: Secondary | ICD-10-CM | POA: Diagnosis not present

## 2023-09-30 DIAGNOSIS — I10 Essential (primary) hypertension: Secondary | ICD-10-CM | POA: Diagnosis not present

## 2023-09-30 DIAGNOSIS — I69354 Hemiplegia and hemiparesis following cerebral infarction affecting left non-dominant side: Secondary | ICD-10-CM | POA: Diagnosis not present

## 2023-09-30 DIAGNOSIS — M1990 Unspecified osteoarthritis, unspecified site: Secondary | ICD-10-CM | POA: Diagnosis not present

## 2023-09-30 DIAGNOSIS — M069 Rheumatoid arthritis, unspecified: Secondary | ICD-10-CM | POA: Diagnosis not present

## 2023-10-01 DIAGNOSIS — G8929 Other chronic pain: Secondary | ICD-10-CM | POA: Diagnosis not present

## 2023-10-01 DIAGNOSIS — D692 Other nonthrombocytopenic purpura: Secondary | ICD-10-CM | POA: Diagnosis not present

## 2023-10-01 DIAGNOSIS — M069 Rheumatoid arthritis, unspecified: Secondary | ICD-10-CM | POA: Diagnosis not present

## 2023-10-01 DIAGNOSIS — I10 Essential (primary) hypertension: Secondary | ICD-10-CM | POA: Diagnosis not present

## 2023-10-01 DIAGNOSIS — M1990 Unspecified osteoarthritis, unspecified site: Secondary | ICD-10-CM | POA: Diagnosis not present

## 2023-10-01 DIAGNOSIS — R7303 Prediabetes: Secondary | ICD-10-CM | POA: Diagnosis not present

## 2023-10-01 DIAGNOSIS — I69354 Hemiplegia and hemiparesis following cerebral infarction affecting left non-dominant side: Secondary | ICD-10-CM | POA: Diagnosis not present

## 2023-10-01 DIAGNOSIS — B0223 Postherpetic polyneuropathy: Secondary | ICD-10-CM | POA: Diagnosis not present

## 2023-10-01 DIAGNOSIS — G909 Disorder of the autonomic nervous system, unspecified: Secondary | ICD-10-CM | POA: Diagnosis not present

## 2023-10-07 DIAGNOSIS — I69354 Hemiplegia and hemiparesis following cerebral infarction affecting left non-dominant side: Secondary | ICD-10-CM | POA: Diagnosis not present

## 2023-10-07 DIAGNOSIS — B0223 Postherpetic polyneuropathy: Secondary | ICD-10-CM | POA: Diagnosis not present

## 2023-10-07 DIAGNOSIS — I10 Essential (primary) hypertension: Secondary | ICD-10-CM | POA: Diagnosis not present

## 2023-10-07 DIAGNOSIS — M1990 Unspecified osteoarthritis, unspecified site: Secondary | ICD-10-CM | POA: Diagnosis not present

## 2023-10-07 DIAGNOSIS — M069 Rheumatoid arthritis, unspecified: Secondary | ICD-10-CM | POA: Diagnosis not present

## 2023-10-07 DIAGNOSIS — D692 Other nonthrombocytopenic purpura: Secondary | ICD-10-CM | POA: Diagnosis not present

## 2023-10-07 DIAGNOSIS — R7303 Prediabetes: Secondary | ICD-10-CM | POA: Diagnosis not present

## 2023-10-07 DIAGNOSIS — G8929 Other chronic pain: Secondary | ICD-10-CM | POA: Diagnosis not present

## 2023-10-07 DIAGNOSIS — G909 Disorder of the autonomic nervous system, unspecified: Secondary | ICD-10-CM | POA: Diagnosis not present

## 2023-10-08 DIAGNOSIS — G909 Disorder of the autonomic nervous system, unspecified: Secondary | ICD-10-CM | POA: Diagnosis not present

## 2023-10-08 DIAGNOSIS — B0223 Postherpetic polyneuropathy: Secondary | ICD-10-CM | POA: Diagnosis not present

## 2023-10-08 DIAGNOSIS — M1990 Unspecified osteoarthritis, unspecified site: Secondary | ICD-10-CM | POA: Diagnosis not present

## 2023-10-08 DIAGNOSIS — I69354 Hemiplegia and hemiparesis following cerebral infarction affecting left non-dominant side: Secondary | ICD-10-CM | POA: Diagnosis not present

## 2023-10-08 DIAGNOSIS — I10 Essential (primary) hypertension: Secondary | ICD-10-CM | POA: Diagnosis not present

## 2023-10-08 DIAGNOSIS — R7303 Prediabetes: Secondary | ICD-10-CM | POA: Diagnosis not present

## 2023-10-08 DIAGNOSIS — M069 Rheumatoid arthritis, unspecified: Secondary | ICD-10-CM | POA: Diagnosis not present

## 2023-10-08 DIAGNOSIS — D692 Other nonthrombocytopenic purpura: Secondary | ICD-10-CM | POA: Diagnosis not present

## 2023-10-08 DIAGNOSIS — G8929 Other chronic pain: Secondary | ICD-10-CM | POA: Diagnosis not present

## 2023-10-15 DIAGNOSIS — G8929 Other chronic pain: Secondary | ICD-10-CM | POA: Diagnosis not present

## 2023-10-15 DIAGNOSIS — M1990 Unspecified osteoarthritis, unspecified site: Secondary | ICD-10-CM | POA: Diagnosis not present

## 2023-10-15 DIAGNOSIS — M069 Rheumatoid arthritis, unspecified: Secondary | ICD-10-CM | POA: Diagnosis not present

## 2023-10-15 DIAGNOSIS — G909 Disorder of the autonomic nervous system, unspecified: Secondary | ICD-10-CM | POA: Diagnosis not present

## 2023-10-15 DIAGNOSIS — I69354 Hemiplegia and hemiparesis following cerebral infarction affecting left non-dominant side: Secondary | ICD-10-CM | POA: Diagnosis not present

## 2023-10-15 DIAGNOSIS — D692 Other nonthrombocytopenic purpura: Secondary | ICD-10-CM | POA: Diagnosis not present

## 2023-10-15 DIAGNOSIS — I10 Essential (primary) hypertension: Secondary | ICD-10-CM | POA: Diagnosis not present

## 2023-10-15 DIAGNOSIS — R7303 Prediabetes: Secondary | ICD-10-CM | POA: Diagnosis not present

## 2023-10-15 DIAGNOSIS — B0223 Postherpetic polyneuropathy: Secondary | ICD-10-CM | POA: Diagnosis not present

## 2023-10-22 DIAGNOSIS — M1990 Unspecified osteoarthritis, unspecified site: Secondary | ICD-10-CM | POA: Diagnosis not present

## 2023-10-22 DIAGNOSIS — R7303 Prediabetes: Secondary | ICD-10-CM | POA: Diagnosis not present

## 2023-10-22 DIAGNOSIS — D692 Other nonthrombocytopenic purpura: Secondary | ICD-10-CM | POA: Diagnosis not present

## 2023-10-22 DIAGNOSIS — I10 Essential (primary) hypertension: Secondary | ICD-10-CM | POA: Diagnosis not present

## 2023-10-22 DIAGNOSIS — G8929 Other chronic pain: Secondary | ICD-10-CM | POA: Diagnosis not present

## 2023-10-22 DIAGNOSIS — G909 Disorder of the autonomic nervous system, unspecified: Secondary | ICD-10-CM | POA: Diagnosis not present

## 2023-10-22 DIAGNOSIS — B0223 Postherpetic polyneuropathy: Secondary | ICD-10-CM | POA: Diagnosis not present

## 2023-10-22 DIAGNOSIS — I69354 Hemiplegia and hemiparesis following cerebral infarction affecting left non-dominant side: Secondary | ICD-10-CM | POA: Diagnosis not present

## 2023-10-22 DIAGNOSIS — M069 Rheumatoid arthritis, unspecified: Secondary | ICD-10-CM | POA: Diagnosis not present

## 2023-10-27 DIAGNOSIS — R32 Unspecified urinary incontinence: Secondary | ICD-10-CM | POA: Diagnosis not present

## 2023-10-27 DIAGNOSIS — I69359 Hemiplegia and hemiparesis following cerebral infarction affecting unspecified side: Secondary | ICD-10-CM | POA: Diagnosis not present

## 2023-10-28 DIAGNOSIS — R7989 Other specified abnormal findings of blood chemistry: Secondary | ICD-10-CM | POA: Diagnosis not present

## 2023-10-28 DIAGNOSIS — I69352 Hemiplegia and hemiparesis following cerebral infarction affecting left dominant side: Secondary | ICD-10-CM | POA: Diagnosis not present

## 2023-10-28 DIAGNOSIS — M21372 Foot drop, left foot: Secondary | ICD-10-CM | POA: Diagnosis not present

## 2023-10-28 DIAGNOSIS — M1991 Primary osteoarthritis, unspecified site: Secondary | ICD-10-CM | POA: Diagnosis not present

## 2023-10-28 DIAGNOSIS — M25511 Pain in right shoulder: Secondary | ICD-10-CM | POA: Diagnosis not present

## 2023-10-28 DIAGNOSIS — L4059 Other psoriatic arthropathy: Secondary | ICD-10-CM | POA: Diagnosis not present

## 2023-10-28 DIAGNOSIS — Z8673 Personal history of transient ischemic attack (TIA), and cerebral infarction without residual deficits: Secondary | ICD-10-CM | POA: Diagnosis not present

## 2023-10-29 DIAGNOSIS — G8929 Other chronic pain: Secondary | ICD-10-CM | POA: Diagnosis not present

## 2023-10-29 DIAGNOSIS — D692 Other nonthrombocytopenic purpura: Secondary | ICD-10-CM | POA: Diagnosis not present

## 2023-10-29 DIAGNOSIS — I10 Essential (primary) hypertension: Secondary | ICD-10-CM | POA: Diagnosis not present

## 2023-10-29 DIAGNOSIS — M069 Rheumatoid arthritis, unspecified: Secondary | ICD-10-CM | POA: Diagnosis not present

## 2023-10-29 DIAGNOSIS — I69354 Hemiplegia and hemiparesis following cerebral infarction affecting left non-dominant side: Secondary | ICD-10-CM | POA: Diagnosis not present

## 2023-10-29 DIAGNOSIS — M1990 Unspecified osteoarthritis, unspecified site: Secondary | ICD-10-CM | POA: Diagnosis not present

## 2023-10-29 DIAGNOSIS — B0223 Postherpetic polyneuropathy: Secondary | ICD-10-CM | POA: Diagnosis not present

## 2023-10-29 DIAGNOSIS — G909 Disorder of the autonomic nervous system, unspecified: Secondary | ICD-10-CM | POA: Diagnosis not present

## 2023-10-29 DIAGNOSIS — R7303 Prediabetes: Secondary | ICD-10-CM | POA: Diagnosis not present

## 2023-11-01 DIAGNOSIS — I69354 Hemiplegia and hemiparesis following cerebral infarction affecting left non-dominant side: Secondary | ICD-10-CM | POA: Diagnosis not present

## 2023-11-01 DIAGNOSIS — R7303 Prediabetes: Secondary | ICD-10-CM | POA: Diagnosis not present

## 2023-11-01 DIAGNOSIS — I10 Essential (primary) hypertension: Secondary | ICD-10-CM | POA: Diagnosis not present

## 2023-11-01 DIAGNOSIS — M069 Rheumatoid arthritis, unspecified: Secondary | ICD-10-CM | POA: Diagnosis not present

## 2023-11-01 DIAGNOSIS — G8929 Other chronic pain: Secondary | ICD-10-CM | POA: Diagnosis not present

## 2023-11-01 DIAGNOSIS — D692 Other nonthrombocytopenic purpura: Secondary | ICD-10-CM | POA: Diagnosis not present

## 2023-11-01 DIAGNOSIS — G909 Disorder of the autonomic nervous system, unspecified: Secondary | ICD-10-CM | POA: Diagnosis not present

## 2023-11-01 DIAGNOSIS — M1990 Unspecified osteoarthritis, unspecified site: Secondary | ICD-10-CM | POA: Diagnosis not present

## 2023-11-01 DIAGNOSIS — B0223 Postherpetic polyneuropathy: Secondary | ICD-10-CM | POA: Diagnosis not present

## 2023-11-07 DIAGNOSIS — M1990 Unspecified osteoarthritis, unspecified site: Secondary | ICD-10-CM | POA: Diagnosis not present

## 2023-11-07 DIAGNOSIS — D692 Other nonthrombocytopenic purpura: Secondary | ICD-10-CM | POA: Diagnosis not present

## 2023-11-07 DIAGNOSIS — I10 Essential (primary) hypertension: Secondary | ICD-10-CM | POA: Diagnosis not present

## 2023-11-07 DIAGNOSIS — R7303 Prediabetes: Secondary | ICD-10-CM | POA: Diagnosis not present

## 2023-11-07 DIAGNOSIS — B0223 Postherpetic polyneuropathy: Secondary | ICD-10-CM | POA: Diagnosis not present

## 2023-11-07 DIAGNOSIS — G8929 Other chronic pain: Secondary | ICD-10-CM | POA: Diagnosis not present

## 2023-11-07 DIAGNOSIS — M069 Rheumatoid arthritis, unspecified: Secondary | ICD-10-CM | POA: Diagnosis not present

## 2023-11-07 DIAGNOSIS — G909 Disorder of the autonomic nervous system, unspecified: Secondary | ICD-10-CM | POA: Diagnosis not present

## 2023-11-07 DIAGNOSIS — I69354 Hemiplegia and hemiparesis following cerebral infarction affecting left non-dominant side: Secondary | ICD-10-CM | POA: Diagnosis not present

## 2023-11-09 DIAGNOSIS — M1990 Unspecified osteoarthritis, unspecified site: Secondary | ICD-10-CM | POA: Diagnosis not present

## 2023-11-09 DIAGNOSIS — R7303 Prediabetes: Secondary | ICD-10-CM | POA: Diagnosis not present

## 2023-11-09 DIAGNOSIS — G8929 Other chronic pain: Secondary | ICD-10-CM | POA: Diagnosis not present

## 2023-11-09 DIAGNOSIS — M069 Rheumatoid arthritis, unspecified: Secondary | ICD-10-CM | POA: Diagnosis not present

## 2023-11-09 DIAGNOSIS — D692 Other nonthrombocytopenic purpura: Secondary | ICD-10-CM | POA: Diagnosis not present

## 2023-11-09 DIAGNOSIS — I10 Essential (primary) hypertension: Secondary | ICD-10-CM | POA: Diagnosis not present

## 2023-11-09 DIAGNOSIS — I69354 Hemiplegia and hemiparesis following cerebral infarction affecting left non-dominant side: Secondary | ICD-10-CM | POA: Diagnosis not present

## 2023-11-09 DIAGNOSIS — G909 Disorder of the autonomic nervous system, unspecified: Secondary | ICD-10-CM | POA: Diagnosis not present

## 2023-11-09 DIAGNOSIS — B0223 Postherpetic polyneuropathy: Secondary | ICD-10-CM | POA: Diagnosis not present

## 2023-11-12 DIAGNOSIS — M069 Rheumatoid arthritis, unspecified: Secondary | ICD-10-CM | POA: Diagnosis not present

## 2023-11-12 DIAGNOSIS — D692 Other nonthrombocytopenic purpura: Secondary | ICD-10-CM | POA: Diagnosis not present

## 2023-11-12 DIAGNOSIS — M1990 Unspecified osteoarthritis, unspecified site: Secondary | ICD-10-CM | POA: Diagnosis not present

## 2023-11-12 DIAGNOSIS — I10 Essential (primary) hypertension: Secondary | ICD-10-CM | POA: Diagnosis not present

## 2023-11-12 DIAGNOSIS — G8929 Other chronic pain: Secondary | ICD-10-CM | POA: Diagnosis not present

## 2023-11-12 DIAGNOSIS — B0223 Postherpetic polyneuropathy: Secondary | ICD-10-CM | POA: Diagnosis not present

## 2023-11-12 DIAGNOSIS — G909 Disorder of the autonomic nervous system, unspecified: Secondary | ICD-10-CM | POA: Diagnosis not present

## 2023-11-12 DIAGNOSIS — R7303 Prediabetes: Secondary | ICD-10-CM | POA: Diagnosis not present

## 2023-11-12 DIAGNOSIS — I69354 Hemiplegia and hemiparesis following cerebral infarction affecting left non-dominant side: Secondary | ICD-10-CM | POA: Diagnosis not present

## 2023-11-24 DIAGNOSIS — I10 Essential (primary) hypertension: Secondary | ICD-10-CM | POA: Diagnosis not present

## 2023-11-24 DIAGNOSIS — B0223 Postherpetic polyneuropathy: Secondary | ICD-10-CM | POA: Diagnosis not present

## 2023-11-24 DIAGNOSIS — M069 Rheumatoid arthritis, unspecified: Secondary | ICD-10-CM | POA: Diagnosis not present

## 2023-11-24 DIAGNOSIS — M1990 Unspecified osteoarthritis, unspecified site: Secondary | ICD-10-CM | POA: Diagnosis not present

## 2023-11-24 DIAGNOSIS — G8929 Other chronic pain: Secondary | ICD-10-CM | POA: Diagnosis not present

## 2023-11-24 DIAGNOSIS — R7303 Prediabetes: Secondary | ICD-10-CM | POA: Diagnosis not present

## 2023-11-24 DIAGNOSIS — G909 Disorder of the autonomic nervous system, unspecified: Secondary | ICD-10-CM | POA: Diagnosis not present

## 2023-11-24 DIAGNOSIS — I69354 Hemiplegia and hemiparesis following cerebral infarction affecting left non-dominant side: Secondary | ICD-10-CM | POA: Diagnosis not present

## 2023-11-24 DIAGNOSIS — D692 Other nonthrombocytopenic purpura: Secondary | ICD-10-CM | POA: Diagnosis not present

## 2023-11-26 DIAGNOSIS — R32 Unspecified urinary incontinence: Secondary | ICD-10-CM | POA: Diagnosis not present

## 2023-11-26 DIAGNOSIS — I69359 Hemiplegia and hemiparesis following cerebral infarction affecting unspecified side: Secondary | ICD-10-CM | POA: Diagnosis not present

## 2023-12-01 DIAGNOSIS — I69354 Hemiplegia and hemiparesis following cerebral infarction affecting left non-dominant side: Secondary | ICD-10-CM | POA: Diagnosis not present

## 2023-12-01 DIAGNOSIS — I10 Essential (primary) hypertension: Secondary | ICD-10-CM | POA: Diagnosis not present

## 2023-12-01 DIAGNOSIS — G909 Disorder of the autonomic nervous system, unspecified: Secondary | ICD-10-CM | POA: Diagnosis not present

## 2023-12-01 DIAGNOSIS — R7303 Prediabetes: Secondary | ICD-10-CM | POA: Diagnosis not present

## 2023-12-01 DIAGNOSIS — M1990 Unspecified osteoarthritis, unspecified site: Secondary | ICD-10-CM | POA: Diagnosis not present

## 2023-12-01 DIAGNOSIS — D692 Other nonthrombocytopenic purpura: Secondary | ICD-10-CM | POA: Diagnosis not present

## 2023-12-01 DIAGNOSIS — B0223 Postherpetic polyneuropathy: Secondary | ICD-10-CM | POA: Diagnosis not present

## 2023-12-01 DIAGNOSIS — M069 Rheumatoid arthritis, unspecified: Secondary | ICD-10-CM | POA: Diagnosis not present

## 2023-12-01 DIAGNOSIS — G8929 Other chronic pain: Secondary | ICD-10-CM | POA: Diagnosis not present

## 2023-12-08 DIAGNOSIS — M069 Rheumatoid arthritis, unspecified: Secondary | ICD-10-CM | POA: Diagnosis not present

## 2023-12-08 DIAGNOSIS — G8929 Other chronic pain: Secondary | ICD-10-CM | POA: Diagnosis not present

## 2023-12-08 DIAGNOSIS — I10 Essential (primary) hypertension: Secondary | ICD-10-CM | POA: Diagnosis not present

## 2023-12-08 DIAGNOSIS — R7303 Prediabetes: Secondary | ICD-10-CM | POA: Diagnosis not present

## 2023-12-08 DIAGNOSIS — D692 Other nonthrombocytopenic purpura: Secondary | ICD-10-CM | POA: Diagnosis not present

## 2023-12-08 DIAGNOSIS — G909 Disorder of the autonomic nervous system, unspecified: Secondary | ICD-10-CM | POA: Diagnosis not present

## 2023-12-08 DIAGNOSIS — I69354 Hemiplegia and hemiparesis following cerebral infarction affecting left non-dominant side: Secondary | ICD-10-CM | POA: Diagnosis not present

## 2023-12-08 DIAGNOSIS — M1990 Unspecified osteoarthritis, unspecified site: Secondary | ICD-10-CM | POA: Diagnosis not present

## 2023-12-08 DIAGNOSIS — B0223 Postherpetic polyneuropathy: Secondary | ICD-10-CM | POA: Diagnosis not present

## 2023-12-15 DIAGNOSIS — I69354 Hemiplegia and hemiparesis following cerebral infarction affecting left non-dominant side: Secondary | ICD-10-CM | POA: Diagnosis not present

## 2023-12-15 DIAGNOSIS — B0223 Postherpetic polyneuropathy: Secondary | ICD-10-CM | POA: Diagnosis not present

## 2023-12-15 DIAGNOSIS — M069 Rheumatoid arthritis, unspecified: Secondary | ICD-10-CM | POA: Diagnosis not present

## 2023-12-15 DIAGNOSIS — G909 Disorder of the autonomic nervous system, unspecified: Secondary | ICD-10-CM | POA: Diagnosis not present

## 2023-12-15 DIAGNOSIS — R7303 Prediabetes: Secondary | ICD-10-CM | POA: Diagnosis not present

## 2023-12-15 DIAGNOSIS — M1990 Unspecified osteoarthritis, unspecified site: Secondary | ICD-10-CM | POA: Diagnosis not present

## 2023-12-15 DIAGNOSIS — D692 Other nonthrombocytopenic purpura: Secondary | ICD-10-CM | POA: Diagnosis not present

## 2023-12-15 DIAGNOSIS — I10 Essential (primary) hypertension: Secondary | ICD-10-CM | POA: Diagnosis not present

## 2023-12-15 DIAGNOSIS — G8929 Other chronic pain: Secondary | ICD-10-CM | POA: Diagnosis not present

## 2023-12-26 DIAGNOSIS — R7303 Prediabetes: Secondary | ICD-10-CM | POA: Diagnosis not present

## 2023-12-26 DIAGNOSIS — D692 Other nonthrombocytopenic purpura: Secondary | ICD-10-CM | POA: Diagnosis not present

## 2023-12-26 DIAGNOSIS — G8929 Other chronic pain: Secondary | ICD-10-CM | POA: Diagnosis not present

## 2023-12-26 DIAGNOSIS — I69354 Hemiplegia and hemiparesis following cerebral infarction affecting left non-dominant side: Secondary | ICD-10-CM | POA: Diagnosis not present

## 2023-12-26 DIAGNOSIS — B0223 Postherpetic polyneuropathy: Secondary | ICD-10-CM | POA: Diagnosis not present

## 2023-12-26 DIAGNOSIS — M1990 Unspecified osteoarthritis, unspecified site: Secondary | ICD-10-CM | POA: Diagnosis not present

## 2023-12-26 DIAGNOSIS — G909 Disorder of the autonomic nervous system, unspecified: Secondary | ICD-10-CM | POA: Diagnosis not present

## 2023-12-26 DIAGNOSIS — M069 Rheumatoid arthritis, unspecified: Secondary | ICD-10-CM | POA: Diagnosis not present

## 2023-12-26 DIAGNOSIS — I10 Essential (primary) hypertension: Secondary | ICD-10-CM | POA: Diagnosis not present

## 2023-12-27 DIAGNOSIS — I69359 Hemiplegia and hemiparesis following cerebral infarction affecting unspecified side: Secondary | ICD-10-CM | POA: Diagnosis not present

## 2023-12-27 DIAGNOSIS — R32 Unspecified urinary incontinence: Secondary | ICD-10-CM | POA: Diagnosis not present

## 2024-01-24 DIAGNOSIS — I69359 Hemiplegia and hemiparesis following cerebral infarction affecting unspecified side: Secondary | ICD-10-CM | POA: Diagnosis not present

## 2024-01-24 DIAGNOSIS — R32 Unspecified urinary incontinence: Secondary | ICD-10-CM | POA: Diagnosis not present

## 2024-02-04 DIAGNOSIS — L4059 Other psoriatic arthropathy: Secondary | ICD-10-CM | POA: Diagnosis not present

## 2024-02-11 DIAGNOSIS — E559 Vitamin D deficiency, unspecified: Secondary | ICD-10-CM | POA: Diagnosis not present

## 2024-02-11 DIAGNOSIS — Z0000 Encounter for general adult medical examination without abnormal findings: Secondary | ICD-10-CM | POA: Diagnosis not present

## 2024-02-11 DIAGNOSIS — R7303 Prediabetes: Secondary | ICD-10-CM | POA: Diagnosis not present

## 2024-02-11 DIAGNOSIS — E041 Nontoxic single thyroid nodule: Secondary | ICD-10-CM | POA: Diagnosis not present

## 2024-02-11 DIAGNOSIS — L409 Psoriasis, unspecified: Secondary | ICD-10-CM | POA: Diagnosis not present

## 2024-02-11 DIAGNOSIS — M25511 Pain in right shoulder: Secondary | ICD-10-CM | POA: Diagnosis not present

## 2024-02-11 DIAGNOSIS — K219 Gastro-esophageal reflux disease without esophagitis: Secondary | ICD-10-CM | POA: Diagnosis not present

## 2024-02-11 DIAGNOSIS — I10 Essential (primary) hypertension: Secondary | ICD-10-CM | POA: Diagnosis not present

## 2024-02-11 DIAGNOSIS — I69359 Hemiplegia and hemiparesis following cerebral infarction affecting unspecified side: Secondary | ICD-10-CM | POA: Diagnosis not present

## 2024-03-03 DIAGNOSIS — R32 Unspecified urinary incontinence: Secondary | ICD-10-CM | POA: Diagnosis not present

## 2024-03-30 DIAGNOSIS — R32 Unspecified urinary incontinence: Secondary | ICD-10-CM | POA: Diagnosis not present

## 2024-04-25 DIAGNOSIS — R32 Unspecified urinary incontinence: Secondary | ICD-10-CM | POA: Diagnosis not present

## 2024-04-27 DIAGNOSIS — L4059 Other psoriatic arthropathy: Secondary | ICD-10-CM | POA: Diagnosis not present

## 2024-04-27 DIAGNOSIS — M1991 Primary osteoarthritis, unspecified site: Secondary | ICD-10-CM | POA: Diagnosis not present

## 2024-04-27 DIAGNOSIS — M21372 Foot drop, left foot: Secondary | ICD-10-CM | POA: Diagnosis not present

## 2024-04-27 DIAGNOSIS — Z6820 Body mass index (BMI) 20.0-20.9, adult: Secondary | ICD-10-CM | POA: Diagnosis not present

## 2024-04-27 DIAGNOSIS — Z8673 Personal history of transient ischemic attack (TIA), and cerebral infarction without residual deficits: Secondary | ICD-10-CM | POA: Diagnosis not present

## 2024-04-27 DIAGNOSIS — M25511 Pain in right shoulder: Secondary | ICD-10-CM | POA: Diagnosis not present

## 2024-04-27 DIAGNOSIS — R7989 Other specified abnormal findings of blood chemistry: Secondary | ICD-10-CM | POA: Diagnosis not present

## 2024-05-05 DIAGNOSIS — R109 Unspecified abdominal pain: Secondary | ICD-10-CM | POA: Diagnosis not present

## 2024-05-06 ENCOUNTER — Ambulatory Visit (HOSPITAL_BASED_OUTPATIENT_CLINIC_OR_DEPARTMENT_OTHER)
Admission: RE | Admit: 2024-05-06 | Discharge: 2024-05-06 | Disposition: A | Discharge status: Home / Self Care | Source: Ambulatory Visit | Attending: Family Medicine | Admitting: Family Medicine

## 2024-05-06 ENCOUNTER — Other Ambulatory Visit (HOSPITAL_BASED_OUTPATIENT_CLINIC_OR_DEPARTMENT_OTHER): Payer: Self-pay | Admitting: Family Medicine

## 2024-05-06 DIAGNOSIS — R109 Unspecified abdominal pain: Secondary | ICD-10-CM

## 2024-05-06 DIAGNOSIS — K8020 Calculus of gallbladder without cholecystitis without obstruction: Secondary | ICD-10-CM | POA: Diagnosis not present

## 2024-05-06 DIAGNOSIS — K828 Other specified diseases of gallbladder: Secondary | ICD-10-CM | POA: Diagnosis not present

## 2024-05-06 DIAGNOSIS — R1013 Epigastric pain: Secondary | ICD-10-CM | POA: Diagnosis not present

## 2024-05-06 DIAGNOSIS — K7689 Other specified diseases of liver: Secondary | ICD-10-CM | POA: Diagnosis not present

## 2024-05-07 DIAGNOSIS — I10 Essential (primary) hypertension: Secondary | ICD-10-CM | POA: Diagnosis not present

## 2024-05-10 DIAGNOSIS — I10 Essential (primary) hypertension: Secondary | ICD-10-CM | POA: Diagnosis not present

## 2024-05-11 DIAGNOSIS — R110 Nausea: Secondary | ICD-10-CM | POA: Diagnosis not present

## 2024-05-11 DIAGNOSIS — R109 Unspecified abdominal pain: Secondary | ICD-10-CM | POA: Diagnosis not present

## 2024-05-11 DIAGNOSIS — R634 Abnormal weight loss: Secondary | ICD-10-CM | POA: Diagnosis not present

## 2024-05-11 DIAGNOSIS — R1013 Epigastric pain: Secondary | ICD-10-CM | POA: Diagnosis not present

## 2024-05-11 DIAGNOSIS — K219 Gastro-esophageal reflux disease without esophagitis: Secondary | ICD-10-CM | POA: Diagnosis not present

## 2024-05-12 ENCOUNTER — Other Ambulatory Visit (HOSPITAL_BASED_OUTPATIENT_CLINIC_OR_DEPARTMENT_OTHER): Payer: Self-pay | Admitting: Internal Medicine

## 2024-05-12 DIAGNOSIS — R634 Abnormal weight loss: Secondary | ICD-10-CM

## 2024-05-24 DIAGNOSIS — E785 Hyperlipidemia, unspecified: Secondary | ICD-10-CM | POA: Diagnosis not present

## 2024-05-24 DIAGNOSIS — M1990 Unspecified osteoarthritis, unspecified site: Secondary | ICD-10-CM | POA: Diagnosis not present

## 2024-05-24 DIAGNOSIS — M069 Rheumatoid arthritis, unspecified: Secondary | ICD-10-CM | POA: Diagnosis not present

## 2024-05-24 DIAGNOSIS — I10 Essential (primary) hypertension: Secondary | ICD-10-CM | POA: Diagnosis not present

## 2024-05-25 DIAGNOSIS — R32 Unspecified urinary incontinence: Secondary | ICD-10-CM | POA: Diagnosis not present

## 2024-06-01 ENCOUNTER — Ambulatory Visit

## 2024-06-01 VITALS — BP 110/60 | HR 86 | Ht 64.0 in | Wt 132.1 lb

## 2024-06-01 DIAGNOSIS — I10 Essential (primary) hypertension: Secondary | ICD-10-CM | POA: Diagnosis not present

## 2024-06-01 DIAGNOSIS — R079 Chest pain, unspecified: Secondary | ICD-10-CM | POA: Diagnosis not present

## 2024-06-01 MED ORDER — ASPIRIN 81 MG PO TBEC: 81.0000 mg | DELAYED_RELEASE_TABLET | Freq: Every day | ORAL | 3 refills | Status: AC

## 2024-06-01 NOTE — Assessment & Plan Note (Signed)
 Chest pain on exertion relieved with rest.  Does have cardiovascular risk factors. Would recommend him to be on aspirin  81 mg once daily, reports no significant side effects to it in the past.  Unclear why he is not on this despite his history of stroke.  Continue with atorvastatin 10 mg once daily  Continue evaluation with transthoracic echocardiogram and stress test with nuclear imaging to assess cardiac structure and function and to rule out any ischemia.

## 2024-06-01 NOTE — Progress Notes (Signed)
 Cardiology Consultation:    Date:  06/01/2024   ID:  Danny Lara, DOB 1941-04-25, MRN [id number]  PCP:  Verena Mems, MD  Cardiologist:  Danny SAUNDERS Lorain Fettes, MD   Referring MD: Verena Mems, MD   No chief complaint on file.    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  Problem List Items Addressed This Visit     Essential hypertension   Blood pressures well-controlled on current regimen with amlodipine 10 mg once daily Spironolactone 25 mg once daily       Relevant Medications   aspirin  EC 81 MG tablet   Chest pain - Primary   Chest pain on exertion relieved with rest.  Does have cardiovascular risk factors. Would recommend him to be on aspirin  81 mg once daily, reports no significant side effects to it in the past.  Unclear why he is not on this despite his history of stroke.  Continue with atorvastatin 10 mg once daily  Continue evaluation with transthoracic echocardiogram and stress test with nuclear imaging to assess cardiac structure and function and to rule out any ischemia.        Relevant Orders   EKG 12-Lead (Completed)   ECHOCARDIOGRAM COMPLETE   MYOCARDIAL PERFUSION IMAGING   Return to clinic tentatively in 2 months.   History of Present Illness:    Danny Lara is a 83 y.o. male who is being seen today for the evaluation of chest pain at the request of Verena Mems, MD. Previously visit with cardiologist Dr. Maranda in October 2015 for evaluation of chest pain atypical, Lexiscan  stress test at that time showed no ischemia.  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 Lives with his daughter's family at home.  She helps manage his medications.  His mobility is limited due to his left-sided hemiparesis and uses a cane to ambulate in the house limited amounts and the rest of it he uses a wheelchair.  Mentions over the last 3 months he has been noticing symptoms of being more tired and feeling heaviness in the chest with exertion that relieves with rest. Denies any symptoms while at rest. Denies any palpitations, lightheadedness or syncopal episodes.  No pedal edema. No blood in urine or stools.  Her gastrointestinal symptoms was getting evaluated by gastroenterologist and was referred here for further evaluation.  EKG in the clinic today shows sinus rhythm heart rate 86/min follow-up PR interval prolonged to 66 ms, QRS duration 86 ms, Q waves in lead III aVF along with T wave inversions.  Possible inferior ischemia.    Blood work from 02/13/2024 noted hemoglobin A1c 5.9 TSH 1.21 and free T40.8 Lipid panel with total cholesterol 112, HDL 48, triglycerides 72, LDL 49, well-controlled.  Past Medical History:  Diagnosis Date   Hypertension, essential    Stroke [facility])     Past Surgical History:  Procedure Laterality Date   APPENDECTOMY     boil removal Right 1997    behind ear   EYE SURGERY Right 07-18-14    Current Medications: Current Meds  Medication Sig  amLODipine (NORVASC) 10 MG tablet Take 10 mg by mouth daily.   aspirin  EC 81 MG tablet Take 1 tablet (81 mg total) by mouth daily. Swallow whole.   atorvastatin (LIPITOR) 10 MG tablet Take 10 mg by mouth daily.   b complex vitamins capsule Take 1 capsule by mouth daily.   cholecalciferol (VITAMIN D) 1000 units tablet Take 2,000 Units by mouth daily.   clobetasol ointment (TEMOVATE) 0.05 % Apply 1 application topically 2 (two) times daily as  needed.   gabapentin (NEURONTIN) 100 MG capsule Take 100 mg by mouth 3 (three) times daily.   potassium chloride SA (K-DUR,KLOR-CON) 20 MEQ tablet Take 10 mEq by mouth once.   spironolactone (ALDACTONE) 25 MG tablet Take 25 mg by mouth daily.   sucralfate (CARAFATE) 1 g tablet Take 1 g by mouth 4 (four) times daily -  with meals and at bedtime.   triamcinolone  cream (KENALOG ) 0.1 % Apply 1 application topically 2 (two) times daily.     Allergies:   Patient has no known allergies.   Social History   Socioeconomic History   Marital status: Married    Spouse name: Not on file   Number of children: Not on file   Years of education: Not on file   Highest education level: Not on file  Occupational History   Not on file  Tobacco Use   Smoking status: Former   Smokeless tobacco: Never  Vaping Use   Vaping status: Never Used  Substance and Sexual Activity   Alcohol use: No   Drug use: No   Sexual activity: Not on file  Other Topics Concern   Not on file  Social History Narrative   Not on file   Social Drivers of Health   Financial Resource Strain: Not on file  Food Insecurity: Not on file  Transportation Needs: Not on file  Physical Activity: Not on file  Stress: Not on file  Social Connections: Not on file     Family History: The patient's family history includes Aneurysm (age of onset: 87) in his daughter; CVA (age of onset: 9) in his daughter; Colon cancer in his brother; Diabetes in his brother; Hypertension in his daughter; Tuberculosis in his son. ROS:   Please see the history of present illness.    All 14 point review of systems negative except as described per history of present illness.  EKGs/Labs/Other Studies Reviewed:    The following studies were reviewed today:   EKG:  EKG Interpretation Date/Time:  Tuesday June 01 2024 15:20:22 EDT Ventricular Rate:  86 PR Interval:  266 QRS Duration:  86 QT Interval:  354 QTC Calculation: 423 R Axis:   25  Text  Interpretation: Sinus rhythm with 1st degree A-V block Minimal voltage criteria for LVH, may be normal variant ( R in aVL ) ST & T wave abnormality, consider inferior ischemia When compared with ECG of 26-Oct-2021 17:24, PREVIOUS ECG IS PRESENT Confirmed by Liborio Hai reddy [phone]) on 06/01/2024 3:35:34 PM    Recent Labs: No results found for requested labs within last 365 days.  Recent Lipid Panel    Component Value Date/Time   CHOL 152 09/21/2014 0851   TRIG 101.0 09/21/2014 0851   HDL 39.40 09/21/2014 0851   CHOLHDL 4 09/21/2014 0851   VLDL 20.2 09/21/2014 0851   LDLCALC 92 09/21/2014 0851    Physical Exam:    VS:  BP 110/60   Pulse 86   Ht 5' 4 (1.626 m)  Wt 132 lb 1.9 oz (59.9 kg)   SpO2 96%   BMI 22.68 kg/m     Wt Readings from Last 3 Encounters:  06/01/24 132 lb 1.9 oz (59.9 kg)  10/26/21 125 lb (56.7 kg)  02/16/20 137 lb (62.1 kg)     GENERAL:  Well nourished, well developed in no acute distress NECK: No JVD; No carotid bruits CARDIAC: RRR, S1 and S2 present, no murmurs, no rubs, no gallops CHEST:  Clear to auscultation without rales, wheezing or rhonchi  Extremities: No pitting pedal edema. Pulses bilaterally symmetric with radial 2+ and dorsalis pedis 2+ NEUROLOGIC:  Alert and oriented x 3  Medication Adjustments/Labs and Tests Ordered: Current medicines are reviewed at length with the patient today.  Concerns regarding medicines are outlined above.  Orders Placed This Encounter  Procedures   MYOCARDIAL PERFUSION IMAGING   EKG 12-Lead   ECHOCARDIOGRAM COMPLETE   Meds ordered this encounter  Medications   aspirin  EC 81 MG tablet    Sig: Take 1 tablet (81 mg total) by mouth daily. Swallow whole.    Dispense:  90 tablet    Refill:  3    Signed, Margie Brink reddy Traci Gafford, MD, MPH, [facility]. 06/01/2024 3:56 PM    [facility]

## 2024-06-01 NOTE — Assessment & Plan Note (Signed)
 Blood pressures well-controlled on current regimen with amlodipine 10 mg once daily Spironolactone 25 mg once daily

## 2024-06-01 NOTE — Patient Instructions (Signed)
 Medication Instructions:  Your physician has recommended you make the following change in your medication:   START: Aspirin  81 mg daily  *If you need a refill on your cardiac medications before your next appointment, please call your pharmacy*  Lab Work: None If you have labs (blood work) drawn today and your tests are completely normal, you will receive your results only by: MyChart Message (if you have MyChart) OR A paper copy in the mail If you have any lab test that is abnormal or we need to change your treatment, we will call you to review the results.  Testing/Procedures: Your physician has requested that you have an echocardiogram. Echocardiography is a painless test that uses sound waves to create images of your heart. It provides your doctor with information about the size and shape of your heart and how well your heart's chambers and valves are working. This procedure takes approximately one hour. There are no restrictions for this procedure. Please do NOT wear cologne, perfume, aftershave, or lotions (deodorant is allowed). Please arrive 15 minutes prior to your appointment time.  Please note: We ask at that you not bring children with you during ultrasound (echo/ vascular) testing. Due to room size and safety concerns, children are not allowed in the ultrasound rooms during exams. Our front office staff cannot provide observation of children in our lobby area while testing is being conducted. An adult accompanying a patient to their appointment will only be allowed in the ultrasound room at the discretion of the ultrasound technician under special circumstances. We apologize for any inconvenience.    [facility] at [facility] [address] [place], [place] [address] Phone: [phone]    Please arrive 15 minutes prior to your appointment time for registration and insurance purposes.  The test will take approximately 3 to 4 hours to complete;  you may bring reading material.  If someone comes with you to your appointment, they will need to remain in the main lobby due to limited space in the testing area. **If you are pregnant or breastfeeding, please notify the nuclear lab prior to your appointment**  How to prepare for your Myocardial Perfusion Test: Do not eat or drink 3 hours prior to your test, except you may have water. Do not consume products containing caffeine (regular or decaffeinated) 12 hours prior to your test. (ex: coffee, chocolate, sodas, tea). Do bring a list of your current medications with you.  If not listed below, you may take your medications as normal. Do wear comfortable clothes (no dresses or overalls) and walking shoes, tennis shoes preferred (No heels or open toe shoes are allowed). Do NOT wear cologne, perfume, aftershave, or lotions (deodorant is allowed). If these instructions are not followed, your test will have to be rescheduled.  Please report to [address] ([facility] 2nd Floor, for your test.  If you have questions or concerns about your appointment, you can call the Nuclear Lab at [phone].  If you cannot keep your appointment, please provide 24 hours notification to the Nuclear Lab, to avoid a possible $50 charge to your account.   Follow-Up: At [facility], you and your health needs are our priority.  As part of our continuing mission to provide you with exceptional heart care, our providers are all part of one team.  This team includes your primary Cardiologist (physician) and Advanced Practice Providers or APPs (Physician Assistants and Nurse Practitioners) who all  work together to provide you with the care you need, when you need it.  Your next appointment:   3 month(s)  Provider:   Alean Kobus, MD    We recommend signing up for the patient portal called MyChart.  Sign up information is provided on this After  Visit Summary.  MyChart is used to connect with patients for Virtual Visits (Telemedicine).  Patients are able to view lab/test results, encounter notes, upcoming appointments, etc.  Non-urgent messages can be sent to your provider as well.   To learn more about what you can do with MyChart, go to [email or web address].   Other Instructions None

## 2024-06-05 DIAGNOSIS — I10 Essential (primary) hypertension: Secondary | ICD-10-CM | POA: Diagnosis not present

## 2024-06-15 ENCOUNTER — Ambulatory Visit (HOSPITAL_BASED_OUTPATIENT_CLINIC_OR_DEPARTMENT_OTHER)
Admission: RE | Admit: 2024-06-15 | Discharge: 2024-06-15 | Disposition: A | Discharge status: Home / Self Care | Source: Ambulatory Visit | Attending: Internal Medicine | Admitting: Internal Medicine

## 2024-06-15 ENCOUNTER — Encounter (HOSPITAL_BASED_OUTPATIENT_CLINIC_OR_DEPARTMENT_OTHER): Payer: Self-pay

## 2024-06-15 DIAGNOSIS — K5730 Diverticulosis of large intestine without perforation or abscess without bleeding: Secondary | ICD-10-CM | POA: Diagnosis not present

## 2024-06-15 DIAGNOSIS — K7689 Other specified diseases of liver: Secondary | ICD-10-CM | POA: Diagnosis not present

## 2024-06-15 DIAGNOSIS — R634 Abnormal weight loss: Secondary | ICD-10-CM | POA: Diagnosis not present

## 2024-06-15 DIAGNOSIS — K8020 Calculus of gallbladder without cholecystitis without obstruction: Secondary | ICD-10-CM | POA: Diagnosis not present

## 2024-06-15 MED ORDER — IOHEXOL 300 MG/ML  SOLN
100.0000 mL | Freq: Once | INTRAMUSCULAR | Status: AC | PRN
  Administered 2024-06-15: 100 mL via INTRAVENOUS

## 2024-06-24 DIAGNOSIS — I10 Essential (primary) hypertension: Secondary | ICD-10-CM | POA: Diagnosis not present

## 2024-06-24 DIAGNOSIS — E785 Hyperlipidemia, unspecified: Secondary | ICD-10-CM | POA: Diagnosis not present

## 2024-06-24 DIAGNOSIS — M069 Rheumatoid arthritis, unspecified: Secondary | ICD-10-CM | POA: Diagnosis not present

## 2024-06-24 DIAGNOSIS — M1990 Unspecified osteoarthritis, unspecified site: Secondary | ICD-10-CM | POA: Diagnosis not present

## 2024-06-25 DIAGNOSIS — R32 Unspecified urinary incontinence: Secondary | ICD-10-CM | POA: Diagnosis not present

## 2024-07-05 DIAGNOSIS — I10 Essential (primary) hypertension: Secondary | ICD-10-CM | POA: Diagnosis not present

## 2024-07-06 ENCOUNTER — Telehealth (HOSPITAL_COMMUNITY): Payer: Self-pay

## 2024-07-06 ENCOUNTER — Ambulatory Visit (HOSPITAL_BASED_OUTPATIENT_CLINIC_OR_DEPARTMENT_OTHER)
Admission: RE | Admit: 2024-07-06 | Discharge: 2024-07-06 | Disposition: A | Discharge status: Home / Self Care | Source: Ambulatory Visit

## 2024-07-06 DIAGNOSIS — R079 Chest pain, unspecified: Secondary | ICD-10-CM | POA: Insufficient documentation

## 2024-07-06 NOTE — Telephone Encounter (Signed)
 Detailed instructions left on the patient's answering machine. S.Tayjah Lobdell CCT

## 2024-07-07 LAB — ECHOCARDIOGRAM COMPLETE
AR max vel: 1.82 cm2
AV Area VTI: 1.88 cm2
AV Area mean vel: 1.77 cm2
AV Mean grad: 4 mmHg
AV Peak grad: 7.7 mmHg
AV Vena cont: 0.3 cm
Ao pk vel: 1.39 m/s
Area-P 1/2: 3.08 cm2
Calc EF: 61.6 %
MV M vel: 3.99 m/s
MV Peak grad: 63.7 mmHg
Single Plane A2C EF: 61.3 %
Single Plane A4C EF: 63.5 %

## 2024-07-09 ENCOUNTER — Ambulatory Visit: Payer: Self-pay

## 2024-07-12 ENCOUNTER — Other Ambulatory Visit: Payer: Self-pay

## 2024-07-12 DIAGNOSIS — R079 Chest pain, unspecified: Secondary | ICD-10-CM

## 2024-07-13 ENCOUNTER — Ambulatory Visit (HOSPITAL_COMMUNITY)
Admission: RE | Admit: 2024-07-13 | Discharge: 2024-07-13 | Disposition: A | Discharge status: Home / Self Care | Source: Ambulatory Visit | Attending: Internal Medicine | Admitting: Internal Medicine

## 2024-07-13 DIAGNOSIS — R079 Chest pain, unspecified: Secondary | ICD-10-CM | POA: Insufficient documentation

## 2024-07-13 LAB — MYOCARDIAL PERFUSION IMAGING
LV dias vol: 80 mL (ref 62–150)
LV sys vol: 25 mL (ref 4.2–5.8)
Nuc Stress EF: 69 %
Peak HR: 90 {beats}/min
Rest HR: 66 {beats}/min
Rest Nuclear Isotope Dose: 10.8 mCi
SDS: 0
SRS: 0
SSS: 0
ST Depression (mm): 0 mm
Stress Nuclear Isotope Dose: 31.4 mCi
TID: 0.95

## 2024-07-13 MED ORDER — TECHNETIUM TC 99M TETROFOSMIN IV KIT
10.8000 | PACK | Freq: Once | INTRAVENOUS | Status: AC | PRN
  Administered 2024-07-13: 10.8 via INTRAVENOUS

## 2024-07-13 MED ORDER — TECHNETIUM TC 99M TETROFOSMIN IV KIT
31.4000 | PACK | Freq: Once | INTRAVENOUS | Status: AC | PRN
  Administered 2024-07-13: 31.4 via INTRAVENOUS

## 2024-07-13 MED ORDER — REGADENOSON 0.4 MG/5ML IV SOLN
INTRAVENOUS | Status: AC
  Filled 2024-07-13: qty 5

## 2024-07-13 MED ORDER — REGADENOSON 0.4 MG/5ML IV SOLN
0.4000 mg | Freq: Once | INTRAVENOUS | Status: AC
  Administered 2024-07-13: 0.4 mg via INTRAVENOUS

## 2024-07-17 ENCOUNTER — Ambulatory Visit: Payer: Self-pay

## 2024-07-19 NOTE — Progress Notes (Signed)
 Left message for the patient to call back.

## 2024-07-22 NOTE — Progress Notes (Signed)
 Attempted to call the patient. Patient did not answer and the call could not be completed at this time.

## 2024-07-25 DIAGNOSIS — M069 Rheumatoid arthritis, unspecified: Secondary | ICD-10-CM | POA: Diagnosis not present

## 2024-07-25 DIAGNOSIS — I10 Essential (primary) hypertension: Secondary | ICD-10-CM | POA: Diagnosis not present

## 2024-07-25 DIAGNOSIS — E785 Hyperlipidemia, unspecified: Secondary | ICD-10-CM | POA: Diagnosis not present

## 2024-07-25 DIAGNOSIS — M1990 Unspecified osteoarthritis, unspecified site: Secondary | ICD-10-CM | POA: Diagnosis not present

## 2024-09-17 ENCOUNTER — Encounter (INDEPENDENT_AMBULATORY_CARE_PROVIDER_SITE_OTHER): Payer: Self-pay

## 2024-10-03 DIAGNOSIS — I10 Essential (primary) hypertension: Secondary | ICD-10-CM | POA: Diagnosis not present

## 2024-10-05 DIAGNOSIS — R109 Unspecified abdominal pain: Secondary | ICD-10-CM | POA: Diagnosis not present

## 2024-10-24 DIAGNOSIS — M069 Rheumatoid arthritis, unspecified: Secondary | ICD-10-CM | POA: Diagnosis not present

## 2024-10-24 DIAGNOSIS — M1990 Unspecified osteoarthritis, unspecified site: Secondary | ICD-10-CM | POA: Diagnosis not present

## 2024-10-24 DIAGNOSIS — E785 Hyperlipidemia, unspecified: Secondary | ICD-10-CM | POA: Diagnosis not present

## 2024-10-24 DIAGNOSIS — I10 Essential (primary) hypertension: Secondary | ICD-10-CM | POA: Diagnosis not present

## 2024-10-29 ENCOUNTER — Encounter (INDEPENDENT_AMBULATORY_CARE_PROVIDER_SITE_OTHER): Payer: Self-pay

## 2024-11-02 DIAGNOSIS — M1991 Primary osteoarthritis, unspecified site: Secondary | ICD-10-CM | POA: Diagnosis not present

## 2024-11-02 DIAGNOSIS — M25511 Pain in right shoulder: Secondary | ICD-10-CM | POA: Diagnosis not present

## 2024-11-02 DIAGNOSIS — M25361 Other instability, right knee: Secondary | ICD-10-CM | POA: Diagnosis not present

## 2024-11-02 DIAGNOSIS — Z8673 Personal history of transient ischemic attack (TIA), and cerebral infarction without residual deficits: Secondary | ICD-10-CM | POA: Diagnosis not present

## 2024-11-02 DIAGNOSIS — L4059 Other psoriatic arthropathy: Secondary | ICD-10-CM | POA: Diagnosis not present

## 2024-11-02 DIAGNOSIS — R7989 Other specified abnormal findings of blood chemistry: Secondary | ICD-10-CM | POA: Diagnosis not present

## 2024-11-02 DIAGNOSIS — M21372 Foot drop, left foot: Secondary | ICD-10-CM | POA: Diagnosis not present

## 2024-11-02 DIAGNOSIS — Z6823 Body mass index (BMI) 23.0-23.9, adult: Secondary | ICD-10-CM | POA: Diagnosis not present
